# Patient Record
Sex: Male | Born: 1960 | Hispanic: Yes | Smoking: Never smoker
Health system: Southern US, Community
[De-identification: ages and names within clinical notes are randomized; demographics above are authoritative.]

## PROBLEM LIST (undated history)

## (undated) ENCOUNTER — Emergency Department (HOSPITAL_COMMUNITY): Admission: EM | Payer: Self-pay

## (undated) DIAGNOSIS — I10 Essential (primary) hypertension: Secondary | ICD-10-CM

---

## 2020-10-07 ENCOUNTER — Encounter (HOSPITAL_COMMUNITY): Payer: Self-pay

## 2020-10-07 ENCOUNTER — Other Ambulatory Visit: Payer: Self-pay

## 2020-10-07 DIAGNOSIS — U071 COVID-19: Secondary | ICD-10-CM | POA: Insufficient documentation

## 2020-10-07 DIAGNOSIS — K2970 Gastritis, unspecified, without bleeding: Secondary | ICD-10-CM | POA: Insufficient documentation

## 2020-10-07 DIAGNOSIS — I10 Essential (primary) hypertension: Secondary | ICD-10-CM | POA: Insufficient documentation

## 2020-10-07 LAB — CBC
HCT: 48.9 % (ref 39.0–52.0)
Hemoglobin: 16.3 g/dL (ref 13.0–17.0)
MCH: 30.6 pg (ref 26.0–34.0)
MCHC: 33.3 g/dL (ref 30.0–36.0)
MCV: 91.9 fL (ref 80.0–100.0)
Platelets: 221 10*3/uL (ref 150–400)
RBC: 5.32 MIL/uL (ref 4.22–5.81)
RDW: 14.4 % (ref 11.5–15.5)
WBC: 4.5 10*3/uL (ref 4.0–10.5)
nRBC: 0 % (ref 0.0–0.2)

## 2020-10-07 LAB — COMPREHENSIVE METABOLIC PANEL
ALT: 66 U/L — ABNORMAL HIGH (ref 0–44)
AST: 76 U/L — ABNORMAL HIGH (ref 15–41)
Albumin: 4.4 g/dL (ref 3.5–5.0)
Alkaline Phosphatase: 84 U/L (ref 38–126)
Anion gap: 14 (ref 5–15)
BUN: 11 mg/dL (ref 6–20)
CO2: 24 mmol/L (ref 22–32)
Calcium: 9.3 mg/dL (ref 8.9–10.3)
Chloride: 106 mmol/L (ref 98–111)
Creatinine, Ser: 0.68 mg/dL (ref 0.61–1.24)
GFR, Estimated: >60 mL/min (ref >60)
Glucose, Bld: 82 mg/dL (ref 70–99)
Potassium: 3.4 mmol/L — ABNORMAL LOW (ref 3.5–5.1)
Sodium: 144 mmol/L (ref 135–145)
Total Bilirubin: 0.6 mg/dL (ref 0.3–1.2)
Total Protein: 7.7 g/dL (ref 6.5–8.1)

## 2020-10-07 LAB — LIPASE, BLOOD: Lipase: 47 U/L (ref 11–51)

## 2020-10-07 NOTE — ED Notes (Signed)
DARK GREEN ALSO COLLECTED

## 2020-10-07 NOTE — ED Triage Notes (Signed)
Patient arrived with complaints of NVD and abdominal pain since yesterday, declines taking any OTC medication prior to arrival.

## 2020-10-08 ENCOUNTER — Emergency Department (HOSPITAL_COMMUNITY): Payer: Self-pay

## 2020-10-08 ENCOUNTER — Emergency Department (HOSPITAL_COMMUNITY)
Admission: EM | Admit: 2020-10-08 | Discharge: 2020-10-08 | Disposition: A | Discharge status: Home / Self Care | Payer: Self-pay | Attending: Emergency Medicine | Admitting: Emergency Medicine

## 2020-10-08 ENCOUNTER — Encounter (HOSPITAL_COMMUNITY): Payer: Self-pay | Admitting: Radiology

## 2020-10-08 DIAGNOSIS — R197 Diarrhea, unspecified: Secondary | ICD-10-CM

## 2020-10-08 DIAGNOSIS — R110 Nausea: Secondary | ICD-10-CM

## 2020-10-08 DIAGNOSIS — R112 Nausea with vomiting, unspecified: Secondary | ICD-10-CM

## 2020-10-08 DIAGNOSIS — R1013 Epigastric pain: Secondary | ICD-10-CM

## 2020-10-08 DIAGNOSIS — K2970 Gastritis, unspecified, without bleeding: Secondary | ICD-10-CM

## 2020-10-08 HISTORY — DX: Essential (primary) hypertension: I10

## 2020-10-08 LAB — SARS CORONAVIRUS 2 (TAT 6-24 HRS): SARS Coronavirus 2: POSITIVE — AB

## 2020-10-08 MED ORDER — SODIUM CHLORIDE 0.9 % IV BOLUS
1000.0000 mL | Freq: Once | INTRAVENOUS | Status: AC
  Administered 2020-10-08: 1000 mL via INTRAVENOUS

## 2020-10-08 MED ORDER — LORAZEPAM 2 MG/ML IJ SOLN
1.0000 mg | Freq: Once | INTRAMUSCULAR | Status: AC
  Administered 2020-10-08: 1 mg via INTRAVENOUS
  Filled 2020-10-08: qty 1

## 2020-10-08 MED ORDER — PANTOPRAZOLE SODIUM 20 MG PO TBEC: 20.0000 mg | DELAYED_RELEASE_TABLET | Freq: Every day | ORAL | 0 refills | Status: DC

## 2020-10-08 MED ORDER — IOHEXOL 300 MG/ML  SOLN
100.0000 mL | Freq: Once | INTRAMUSCULAR | Status: AC | PRN
  Administered 2020-10-08: 100 mL via INTRAVENOUS

## 2020-10-08 MED ORDER — PANTOPRAZOLE SODIUM 40 MG IV SOLR
40.0000 mg | Freq: Once | INTRAVENOUS | Status: AC
  Administered 2020-10-08: 40 mg via INTRAVENOUS
  Filled 2020-10-08: qty 40

## 2020-10-08 MED ORDER — ONDANSETRON HCL 4 MG/2ML IJ SOLN
4.0000 mg | Freq: Once | INTRAMUSCULAR | Status: AC
  Administered 2020-10-08: 4 mg via INTRAVENOUS
  Filled 2020-10-08: qty 2

## 2020-10-08 MED ORDER — ONDANSETRON HCL 4 MG PO TABS: 4.0000 mg | ORAL_TABLET | Freq: Four times a day (QID) | ORAL | 0 refills | Status: AC

## 2020-10-08 MED ORDER — SUCRALFATE 1 G PO TABS
1.0000 g | ORAL_TABLET | Freq: Three times a day (TID) | ORAL | 0 refills | Status: DC
Start: 2020-10-08 — End: 2020-11-28

## 2020-10-08 NOTE — ED Notes (Signed)
Pt found lying on floor, states it is more comfortable. Vomiting, tremulous. States he is a daily drinker, last drink yesterday morning.

## 2020-10-08 NOTE — ED Provider Notes (Signed)
[facility]-EMERGENCY DEPT Provider Note   CSN: [id number] Arrival date & time: 10/07/20  2111     History Chief Complaint  Patient presents with  . Abdominal Pain    Willie Collins is a 60 y.o. male.  The history is provided by the patient.  Abdominal Pain Pain location:  Epigastric Pain quality: aching   Pain radiates to:  Does not radiate Pain severity:  Mild Onset quality:  Gradual Duration:  2 days Timing:  Constant Progression:  Unchanged Chronicity:  New Context: alcohol use   Relieved by:  Nothing Worsened by:  Nothing Associated symptoms: diarrhea, nausea and vomiting   Associated symptoms: no chest pain, no chills, no cough, no dysuria, no fever, no hematuria, no shortness of breath and no sore throat   Risk factors: alcohol abuse   Risk factors: has not had multiple surgeries        Past Medical History:  Diagnosis Date  . Hypertension     There are no problems to display for this patient.      No family history on file.     Home Medications Prior to Admission medications   Medication Sig Start Date End Date Taking? Authorizing Provider  ondansetron (ZOFRAN) 4 MG tablet Take 1 tablet (4 mg total) by mouth every 6 (six) hours for 15 doses. 10/08/20 10/12/20 Yes Diona Peregoy, DO  pantoprazole (PROTONIX) 20 MG tablet Take 1 tablet (20 mg total) by mouth daily. 10/08/20 11/07/20 Yes Ladeja Pelham, DO  sucralfate (CARAFATE) 1 g tablet Take 1 tablet (1 g total) by mouth 4 (four) times daily -  with meals and at bedtime for 14 days. 10/08/20 10/22/20 Yes Lorey Pallett, DO    Allergies    Patient has no known allergies.  Review of Systems   Review of Systems  Constitutional: Negative for chills and fever.  HENT: Negative for ear pain and sore throat.   Eyes: Negative for pain and visual disturbance.  Respiratory: Negative for cough and shortness of breath.   Cardiovascular: Negative for chest pain and palpitations.   Gastrointestinal: Positive for abdominal pain, diarrhea, nausea and vomiting.  Genitourinary: Negative for dysuria and hematuria.  Musculoskeletal: Negative for arthralgias and back pain.  Skin: Negative for color change and rash.  Neurological: Negative for seizures and syncope.  All other systems reviewed and are negative.   Physical Exam Updated Vital Signs  ED Triage Vitals  Enc Vitals Group     BP 10/07/20 2132 104/86     Pulse Rate 10/07/20 2132 (!) 106     Resp 10/07/20 2132 16     Temp 10/07/20 2132 98.2 F (36.8 C)     Temp Source 10/07/20 2132 Oral     SpO2 10/07/20 2132 93 %     Weight --      Height --      Head Circumference --      Peak Flow --      Pain Score 10/07/20 2129 10     Pain Loc --      Pain Edu? --      Excl. in GC? --     Physical Exam Vitals and nursing note reviewed.  Constitutional:      General: He is in acute distress.     Appearance: He is well-developed and well-nourished.  HENT:     Head: Normocephalic and atraumatic.  Eyes:     Extraocular Movements: Extraocular movements intact.     Conjunctiva/sclera: Conjunctivae normal.  Pupils: Pupils are equal, round, and reactive to light.  Cardiovascular:     Rate and Rhythm: Normal rate and regular rhythm.     Heart sounds: Normal heart sounds. No murmur heard.   Pulmonary:     Effort: Pulmonary effort is normal. No respiratory distress.     Breath sounds: Normal breath sounds.  Abdominal:     General: There is no distension.     Palpations: Abdomen is soft.     Tenderness: There is generalized abdominal tenderness and tenderness in the epigastric area.  Musculoskeletal:        General: No edema.     Cervical back: Neck supple.  Skin:    General: Skin is warm and dry.     Capillary Refill: Capillary refill takes less than 2 seconds.  Neurological:     General: No focal deficit present.     Mental Status: He is alert.  Psychiatric:        Mood and Affect: Mood and affect  and mood normal.     ED Results / Procedures / Treatments   Labs (all labs ordered are listed, but only abnormal results are displayed) Labs Reviewed  COMPREHENSIVE METABOLIC PANEL - Abnormal; Notable for the following components:      Result Value   Potassium 3.4 (*)    AST 76 (*)    ALT 66 (*)    All other components within normal limits  SARS CORONAVIRUS 2 (TAT 6-24 HRS)  LIPASE, BLOOD  CBC    EKG EKG Interpretation  Date/Time:  Tuesday October 08 2020 09:56:22 EST Ventricular Rate:  85 PR Interval:    QRS Duration: 81 QT Interval:  371 QTC Calculation: 442 R Axis:   67 Text Interpretation: Atrial fibrillation Borderline T wave abnormalities Artifact in lead(s) I II aVR V1 Confirmed by Virgina Norfolk [id number] on 10/08/2020 10:15:18 AM   Radiology CT ABDOMEN PELVIS W CONTRAST  Result Date: 10/08/2020 CLINICAL DATA:  61 year old male with abdominal distension. Abdominal pain with nausea vomiting and diarrhea since yesterday. EXAM: CT ABDOMEN AND PELVIS WITH CONTRAST TECHNIQUE: Multidetector CT imaging of the abdomen and pelvis was performed using the standard protocol following bolus administration of intravenous contrast. CONTRAST:  OMNIPAQUE IOHEXOL 300 MG/ML  SOLN COMPARISON:  None. FINDINGS: Lower chest: Mild lung base atelectasis greater on the right. No cardiomegaly, pericardial or pleural effusion. Hepatobiliary: Hepatic steatosis. Negative gallbladder. No bile duct enlargement. Pancreas: Negative. Spleen: Negative. Adrenals/Urinary Tract: Normal adrenal glands. Negative kidneys, with symmetric renal enhancement and contrast excretion. Small benign right renal cortical cyst. Ureters appear symmetric and negative. Negative urinary bladder. Incidental pelvic phleboliths. Stomach/Bowel: Negative large bowel aside from redundancy of the sigmoid colon. Normal appendix (coronal image 66). Negative terminal ileum. No dilated small bowel. Stomach and duodenum are partially  decompressed, within normal limits. No free air, free fluid, mesenteric inflammation. Vascular/Lymphatic: Aortoiliac calcified atherosclerosis. Major arterial structures remain patent. Portal venous system is patent. No lymphadenopathy. Reproductive: Possible small fat containing left inguinal hernia, otherwise negative. Other: No pelvic free fluid. Musculoskeletal: Advanced lumbar spine degeneration. Dystrophic calcifications right flank subcutaneous tissue. No acute osseous abnormality identified. IMPRESSION: 1. No acute or inflammatory process identified in the abdomen or pelvis. 2. Hepatic steatosis. 3. Aortic Atherosclerosis (ICD10-I70.0). Electronically Signed   By: Odessa Fleming M.D.   On: 10/08/2020 10:36    Procedures Procedures   Medications Ordered in ED Medications  sodium chloride 0.9 % bolus 1,000 mL (1,000 mLs Intravenous New Bag/Given 10/08/20 0954)  ondansetron [facility]) injection 4 mg (4 mg Intravenous Given 10/08/20 0955)  LORazepam (ATIVAN) injection 1 mg (1 mg Intravenous Given 10/08/20 0955)  pantoprazole (PROTONIX) injection 40 mg (40 mg Intravenous Given 10/08/20 1024)  iohexol (OMNIPAQUE) 300 MG/ML solution 100 mL (100 mLs Intravenous Contrast Given 10/08/20 1008)    ED Course  I have reviewed the triage vital signs and the nursing notes.  Pertinent labs & imaging results that were available during my care of the patient were reviewed by me and considered in my medical decision making (see chart for details).    MDM Rules/Calculators/A&P                          Willie Collins is a 60 year old male with history of hypertension who presents the ED with nausea, vomiting, diarrhea, epigastric abdominal pain.  Pain for the last day or so.  Daily alcohol drinker.  Last drink was yesterday.  Denies any black or bloody stools.  Possible Covid exposure.  Denies any suspicious food intake.  Tender diffusely on exam but worse in the epigastric region.  Vital signs are normal.  Concern  for gastritis/reflux/pancreatitis/cholecystitis.  Lower suspicion for GI bleed.  We will get basic labs and CT scan abdomen and pelvis.  Will give IV fluids, IV Ativan, IV Zofran and reevaluate.  Lipase is normal doubt pancreatitis.  No significant elevation in hepatobiliary enzymes have a low suspicion for cholecystitis.  No white count.  Normal hemoglobin.  Awaiting CT scan and symptomatic care.  Will give a dose of IV Protonix.  CT scan without any acute findings.  No appendicitis.  No bowel obstruction.  Overall suspect viral process versus alcohol related issue.  Will start Protonix, Carafate and prescribe Zofran.  Will check for Covid.  This chart was dictated using voice recognition software.  Despite best efforts to proofread,  errors can occur which can change the documentation meaning.    Final Clinical Impression(s) / ED Diagnoses Final diagnoses:  Epigastric pain  Nausea  Nausea vomiting and diarrhea  Gastritis without bleeding, unspecified chronicity, unspecified gastritis type    Rx / DC Orders ED Discharge Orders         Ordered    ondansetron (ZOFRAN) 4 MG tablet  Every 6 hours        10/08/20 1108    pantoprazole (PROTONIX) 20 MG tablet  Daily        10/08/20 1108    sucralfate (CARAFATE) 1 g tablet  3 times daily with meals & bedtime        10/08/20 1108           Ellarose Brandi, DO 10/08/20 1108

## 2020-11-24 ENCOUNTER — Other Ambulatory Visit: Payer: Self-pay

## 2020-11-24 ENCOUNTER — Inpatient Hospital Stay (HOSPITAL_COMMUNITY)
Admission: EM | Admit: 2020-11-24 | Discharge: 2020-11-28 | DRG: 603 | Disposition: A | Discharge status: Home Health | Payer: Self-pay | Attending: Family Medicine | Admitting: Family Medicine

## 2020-11-24 ENCOUNTER — Encounter (HOSPITAL_COMMUNITY): Payer: Self-pay

## 2020-11-24 ENCOUNTER — Emergency Department (HOSPITAL_COMMUNITY): Payer: Self-pay

## 2020-11-24 DIAGNOSIS — F419 Anxiety disorder, unspecified: Secondary | ICD-10-CM

## 2020-11-24 DIAGNOSIS — E872 Acidosis: Secondary | ICD-10-CM | POA: Diagnosis present

## 2020-11-24 DIAGNOSIS — I10 Essential (primary) hypertension: Secondary | ICD-10-CM | POA: Diagnosis present

## 2020-11-24 DIAGNOSIS — R17 Unspecified jaundice: Secondary | ICD-10-CM | POA: Diagnosis present

## 2020-11-24 DIAGNOSIS — E875 Hyperkalemia: Secondary | ICD-10-CM

## 2020-11-24 DIAGNOSIS — Z20822 Contact with and (suspected) exposure to covid-19: Secondary | ICD-10-CM | POA: Diagnosis present

## 2020-11-24 DIAGNOSIS — F32A Depression, unspecified: Secondary | ICD-10-CM | POA: Diagnosis present

## 2020-11-24 DIAGNOSIS — R739 Hyperglycemia, unspecified: Secondary | ICD-10-CM | POA: Diagnosis present

## 2020-11-24 DIAGNOSIS — L02612 Cutaneous abscess of left foot: Principal | ICD-10-CM

## 2020-11-24 DIAGNOSIS — L0291 Cutaneous abscess, unspecified: Secondary | ICD-10-CM

## 2020-11-24 DIAGNOSIS — M109 Gout, unspecified: Secondary | ICD-10-CM | POA: Diagnosis present

## 2020-11-24 LAB — CBC WITH DIFFERENTIAL/PLATELET
Abs Immature Granulocytes: 0.03 10*3/uL (ref 0.00–0.07)
Basophils Absolute: 0 10*3/uL (ref 0.0–0.1)
Basophils Relative: 0 %
Eosinophils Absolute: 0.1 10*3/uL (ref 0.0–0.5)
Eosinophils Relative: 1 %
HCT: 51.2 % (ref 39.0–52.0)
Hemoglobin: 17.5 g/dL — ABNORMAL HIGH (ref 13.0–17.0)
Immature Granulocytes: 0 %
Lymphocytes Relative: 13 %
Lymphs Abs: 1.2 10*3/uL (ref 0.7–4.0)
MCH: 31.6 pg (ref 26.0–34.0)
MCHC: 34.2 g/dL (ref 30.0–36.0)
MCV: 92.4 fL (ref 80.0–100.0)
Monocytes Absolute: 0.5 10*3/uL (ref 0.1–1.0)
Monocytes Relative: 5 %
Neutro Abs: 7.4 10*3/uL (ref 1.7–7.7)
Neutrophils Relative %: 81 %
Platelets: 267 10*3/uL (ref 150–400)
RBC: 5.54 MIL/uL (ref 4.22–5.81)
RDW: 13 % (ref 11.5–15.5)
WBC: 9.2 10*3/uL (ref 4.0–10.5)
nRBC: 0 % (ref 0.0–0.2)

## 2020-11-24 LAB — COMPREHENSIVE METABOLIC PANEL
ALT: 31 U/L (ref 0–44)
AST: 55 U/L — ABNORMAL HIGH (ref 15–41)
Albumin: 4.3 g/dL (ref 3.5–5.0)
Alkaline Phosphatase: 110 U/L (ref 38–126)
Anion gap: 12 (ref 5–15)
BUN: 14 mg/dL (ref 6–20)
CO2: 22 mmol/L (ref 22–32)
Calcium: 9.9 mg/dL (ref 8.9–10.3)
Chloride: 102 mmol/L (ref 98–111)
Creatinine, Ser: 0.88 mg/dL (ref 0.61–1.24)
GFR, Estimated: >60 mL/min (ref >60)
Glucose, Bld: 115 mg/dL — ABNORMAL HIGH (ref 70–99)
Potassium: 5.6 mmol/L — ABNORMAL HIGH (ref 3.5–5.1)
Sodium: 136 mmol/L (ref 135–145)
Total Bilirubin: 1.6 mg/dL — ABNORMAL HIGH (ref 0.3–1.2)
Total Protein: 8.2 g/dL — ABNORMAL HIGH (ref 6.5–8.1)

## 2020-11-24 LAB — RESP PANEL BY RT-PCR (FLU A&B, COVID) ARPGX2
Influenza A by PCR: NEGATIVE
Influenza B by PCR: NEGATIVE
SARS Coronavirus 2 by RT PCR: NEGATIVE

## 2020-11-24 LAB — LACTIC ACID, PLASMA: Lactic Acid, Venous: 1.6 mmol/L (ref 0.5–1.9)

## 2020-11-24 MED ORDER — MORPHINE SULFATE (PF) 4 MG/ML IV SOLN
4.0000 mg | Freq: Once | INTRAVENOUS | Status: AC
  Administered 2020-11-24: 4 mg via INTRAVENOUS
  Filled 2020-11-24: qty 1

## 2020-11-24 MED ORDER — HYDROMORPHONE HCL 1 MG/ML IJ SOLN
1.0000 mg | Freq: Once | INTRAMUSCULAR | Status: AC
Start: 2020-11-24 — End: 2020-11-24
  Administered 2020-11-24: 1 mg via INTRAVENOUS
  Filled 2020-11-24: qty 1

## 2020-11-24 MED ORDER — ACETAMINOPHEN 325 MG PO TABS
650.0000 mg | ORAL_TABLET | Freq: Four times a day (QID) | ORAL | Status: DC | PRN
  Administered 2020-11-26 – 2020-11-28 (×4): 650 mg via ORAL
  Filled 2020-11-24 (×4): qty 2

## 2020-11-24 MED ORDER — HYDRALAZINE HCL 20 MG/ML IJ SOLN
5.0000 mg | Freq: Three times a day (TID) | INTRAMUSCULAR | Status: DC | PRN
Start: 2020-11-24 — End: 2020-11-29
  Administered 2020-11-26: 5 mg via INTRAVENOUS
  Filled 2020-11-24: qty 1

## 2020-11-24 MED ORDER — ACETAMINOPHEN 650 MG RE SUPP: 650.0000 mg | Freq: Four times a day (QID) | RECTAL | Status: DC | PRN

## 2020-11-24 MED ORDER — VANCOMYCIN HCL IN DEXTROSE 1-5 GM/200ML-% IV SOLN: 1000.0000 mg | Freq: Once | INTRAVENOUS | Status: DC

## 2020-11-24 MED ORDER — HYDROMORPHONE HCL 1 MG/ML IJ SOLN
0.5000 mg | INTRAMUSCULAR | Status: DC | PRN
Start: 2020-11-24 — End: 2020-11-28
  Administered 2020-11-24 – 2020-11-26 (×10): 0.5 mg via INTRAVENOUS
  Filled 2020-11-24 (×10): qty 0.5

## 2020-11-24 MED ORDER — VANCOMYCIN HCL 750 MG/150ML IV SOLN
750.0000 mg | Freq: Two times a day (BID) | INTRAVENOUS | Status: DC
  Administered 2020-11-25 – 2020-11-27 (×7): 750 mg via INTRAVENOUS
  Filled 2020-11-24 (×9): qty 150

## 2020-11-24 MED ORDER — SODIUM CHLORIDE 0.9 % IV BOLUS
500.0000 mL | Freq: Once | INTRAVENOUS | Status: AC
  Administered 2020-11-24: 500 mL via INTRAVENOUS

## 2020-11-24 MED ORDER — LORAZEPAM 0.5 MG PO TABS
0.5000 mg | ORAL_TABLET | Freq: Two times a day (BID) | ORAL | Status: DC | PRN
  Administered 2020-11-25 – 2020-11-28 (×5): 0.5 mg via ORAL
  Filled 2020-11-24 (×5): qty 1

## 2020-11-24 MED ORDER — HEPARIN SODIUM (PORCINE) 5000 UNIT/ML IJ SOLN
5000.0000 [IU] | Freq: Three times a day (TID) | INTRAMUSCULAR | Status: DC
  Administered 2020-11-24 – 2020-11-27 (×6): 5000 [IU] via SUBCUTANEOUS
  Filled 2020-11-24 (×6): qty 1

## 2020-11-24 MED ORDER — VANCOMYCIN HCL IN DEXTROSE 1-5 GM/200ML-% IV SOLN
1000.0000 mg | Freq: Once | INTRAVENOUS | Status: AC
  Administered 2020-11-24: 1000 mg via INTRAVENOUS
  Filled 2020-11-24: qty 200

## 2020-11-24 MED ORDER — KETOROLAC TROMETHAMINE 15 MG/ML IJ SOLN
7.5000 mg | Freq: Four times a day (QID) | INTRAMUSCULAR | Status: DC | PRN
  Administered 2020-11-24 – 2020-11-27 (×7): 7.5 mg via INTRAVENOUS
  Filled 2020-11-24 (×9): qty 1

## 2020-11-24 NOTE — Plan of Care (Signed)

## 2020-11-24 NOTE — Progress Notes (Signed)
Pharmacy Antibiotic Note  Willie Collins is a 60 y.o. male admitted on 11/24/2020 with L foot cellulitis. He is s/p drainage and a course of PO bactrim  Pharmacy has been consulted for vancomycin dosing.  Plan: Vancomcyin 1g IV given in ED, continue with 750mg  IV q12h for estimated AUC 429 using SCr 0.88 Consider checking vancomycin levels at steady state if continues Follow up renal function & cultures    Temp (24hrs), Avg:98.4 F (36.9 C), Min:98.4 F (36.9 C), Max:98.4 F (36.9 C)  Recent Labs  Lab 11/24/20 1336  WBC 9.2  CREATININE 0.88    CrCl cannot be calculated (Unknown ideal weight.).    No Known Allergies  Antimicrobials this admission: 3/13 Vancomycin >>  Dose adjustments this admission:  Microbiology results: 3/13 COVID/Flu: neg/neg  Thank you for allowing pharmacy to be a part of this patient's care.  4/13, PharmD, BCPS Pharmacy: [phone] 11/24/2020 4:23 PM

## 2020-11-24 NOTE — ED Triage Notes (Signed)
Pt presents with c/o left foot pain. Pt has a wound on his left foot, present for approx 2 weeks, had the wound opened and drained at another hospital earlier this week but reports it is not getting any better.

## 2020-11-24 NOTE — H&P (Signed)
History and Physical        Hospital Admission Note Date: 11/24/2020  Patient name: Willie Collins Medical record number: [id number] Date of birth: [birth year] Age: 60 y.o. Gender: male  PCP: Patient, No Pcp Per   Chief Complaint    Chief Complaint  Patient presents with  . Foot Pain      HPI:   This is a 60 year old male with past medical history of anxiety, depression, gout that presents to the ED with left foot pain.  Patient was recently [facility] for left foot pain and swelling with onset 2 weeks ago at which time he had negative plain films but was noted to have a significant abscess on the medial aspect of his left foot.  He had an I&D and was discharged with 5 days of Bactrim.  He has since completed the Bactrim however because of his severe pain he was taking 3 pills daily as opposed to the prescribed 2 pills and has been taking ibuprofen for pain which has not been helping.  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  He has severe anxiety over having an I&D again due to pain from last time and he is shaking from his anxiety (not chills).  Denies any trauma/injury to the area in the past.  Denies fever, chills or any other complaints.  Of note, he recently moved to the area a few months ago [place] and does not have a PCP.   ED Course: Afebrile, hypertensive, on room air. Notable Labs: Sodium 136, K5.6, glucose 115, BUN 14, creatinine 0.88, AST 55, ALT 31, T bili 1.6, WBC 9.2, Hb 17.5, COVID-19 flu negative. Notable Imaging: Left foot and ankle XR-osteoarthritic change first MTP joint, extensive arterial vascular calcification and calcification of first IP joint, no fracture or dislocation osteomyelitis mention. Patient received Dilaudid,  morphine, vancomycin.    Vitals:   11/24/20 1500 11/24/20 1600  BP: (!) 172/101 (!) 168/97  Pulse: 71 60  Resp: 20 18  Temp:  97.8 F (36.6 C)  SpO2: 99% 100%     Review of Systems:  Review of Systems  All other systems reviewed and are negative.   Medical/Social/Family History   Past Medical History: Past Medical History:  Diagnosis Date  . Hypertension     History reviewed. No pertinent surgical history.  Medications: Prior to Admission medications   Medication Sig Start Date End Date Taking? Authorizing Provider  indomethacin (INDOCIN) 50 MG capsule Take 50 mg by mouth daily as needed (gout).   Yes [provider]  pantoprazole (PROTONIX) 20 MG tablet Take 1 tablet (20 mg total) by mouth daily. Patient not taking: Reported on 11/24/2020 10/08/20 11/07/20  Virgina Norfolk, DO  sucralfate (CARAFATE) 1 g tablet Take 1 tablet (1 g total) by mouth 4 (four) times daily -  with meals and at bedtime for 14 days. Patient not taking: Reported on 11/24/2020 10/08/20 10/22/20  Virgina Norfolk, DO  sulfamethoxazole-trimethoprim (BACTRIM DS) 800-160 MG tablet Take 1 tablet by mouth 2 (two) times daily. Start date : 11/19/20 11/19/20   [provider]    Allergies:  No Known Allergies  Social History:  reports that he has never smoked. He has never used smokeless tobacco. He reports current alcohol use. He reports that he does not use drugs.  Family History: History reviewed. No pertinent family history.   Objective   Physical Exam: Blood pressure (!) 168/97, pulse 60, temperature 97.8 F (36.6 C), temperature source Oral, resp. rate 18, SpO2 100 %.  Physical Exam Vitals and nursing note reviewed.  Constitutional:      Appearance: Normal appearance.     Comments: Generalized shaking  HENT:     Head: Normocephalic and atraumatic.  Eyes:     Conjunctiva/sclera: Conjunctivae normal.  Cardiovascular:     Rate and Rhythm: Normal rate and regular rhythm.   Pulmonary:     Effort: Pulmonary effort is normal.     Breath sounds: Normal breath sounds.  Abdominal:     General: Abdomen is flat.     Palpations: Abdomen is soft.  Musculoskeletal:     Comments: Left medial foot with large abscess and surrounding cellulitis (see pictures below obtained by ED provider)  Skin:    Coloration: Skin is not jaundiced or pale.  Neurological:     Mental Status: He is alert. Mental status is at baseline.  Psychiatric:        Mood and Affect: Mood is anxious.       LABS on Admission: I have personally reviewed all the labs and imaging below    Basic Metabolic Panel: Recent Labs  Lab 11/24/20 1336  NA 136  K 5.6*  CL 102  CO2 22  GLUCOSE 115*  BUN 14  CREATININE 0.88  CALCIUM 9.9   Liver Function Tests: Recent Labs  Lab 11/24/20 1336  AST 55*  ALT 31  ALKPHOS 110  BILITOT 1.6*  PROT 8.2*  ALBUMIN 4.3   No results for input(s): LIPASE, AMYLASE in the last 168 hours. No results for input(s): AMMONIA in the last 168 hours. CBC: Recent Labs  Lab 11/24/20 1336  WBC 9.2  NEUTROABS 7.4  HGB 17.5*  HCT 51.2  MCV 92.4  PLT 267   Cardiac Enzymes: No results for input(s): CKTOTAL, CKMB, CKMBINDEX, TROPONINI in the last 168 hours. BNP: Invalid input(s): POCBNP CBG: No results for input(s): GLUCAP in the last 168 hours.  Radiological Exams on Admission:  DG Ankle Complete Left  Result Date: 11/24/2020 CLINICAL DATA:  Pain with reported recent drainage of lesion from the medial left ankle region EXAM: LEFT ANKLE COMPLETE - 3+ VIEW COMPARISON:  None. FINDINGS: Frontal, oblique, and lateral views were obtained. No evident fracture or joint effusion. Joint spaces appear normal. No erosive change or bony destruction. There is no soft tissue air. There are multiple foci of arterial vascular calcification. IMPRESSION: No fracture or erosion. No bony destruction. No arthropathy appreciable. Ankle mortise appears intact. Multiple foci of  arterial vascular calcification noted. Question underlying diabetes mellitus. Electronically Signed   By: Bretta Bang III M.D.   On: 11/24/2020 14:06   DG Foot Complete Left  Result Date: 11/24/2020 CLINICAL DATA:  Pain EXAM: LEFT FOOT - COMPLETE 3+ VIEW COMPARISON:  None. FINDINGS: Frontal, oblique, and lateral views were obtained. There is no fracture or dislocation. There is moderate narrowing in the first MTP joint with spurring in this area. Calcification is noted in the first IP joint without appreciable joint space narrowing. No erosive changes or bony destruction. There are multiple foci of arterial vascular calcification.  No evidence soft tissue air. IMPRESSION: Osteoarthritic change first MTP joint. Calcification in the first IP joint is likely of arthropathic etiology. No fracture or dislocation. No bony destruction or erosion. Extensive arterial vascular calcification noted. Electronically Signed   By: Bretta Bang III M.D.   On: 11/24/2020 14:04      EKG: Not done   A & P   Principal Problem:   Foot abscess, left Active Problems:   Anxiety   Hyperkalemia   1. Left foot abscess, failed outpatient antibiotics a. Afebrile without leukocytosis, not septic b. Failed outpatient Bactrim c. Left foot XR with extensive vascular calcifications which may be hindering antibiotic therapy and may need vascular eval at some point outpatient d. Discussed with Dr. Samuella Cota, podiatry, plan is for I&D tomorrow and probably discharge post procedure e. N.p.o. after midnight f. Continue vancomycin  2. Anxiety a. Severe anxiety over upcoming procedure leading to generalized shaking b. Ativan 0.5 mg twice daily as needed c. Provided reassurance  3. Hypertension likely related to pain, anxiety possibly hypertensive a. Pain and anxiety control b. As needed hydralazine c. TOC consult, patient needs PCP  4. Asymptomatic hyperkalemia a. K5.6 b. Check EKG  c. IV fluids   DVT  prophylaxis: Heparin   Code Status: Full Code  Diet: Heart healthy, n.p.o. after midnight Family Communication: Admission, patients condition and plan of care including tests being ordered have been discussed with the patient who indicates understanding and agrees with the plan and Code Status.  Disposition Plan: The appropriate patient status for this patient is OBSERVATION. Observation status is judged to be reasonable and necessary in order to provide the required intensity of service to ensure the patient's safety. The patient's presenting symptoms, physical exam findings, and initial radiographic and laboratory data in the context of their medical condition is felt to place them at decreased risk for further clinical deterioration. Furthermore, it is anticipated that the patient will be medically stable for discharge from the hospital within 2 midnights of admission. The following factors support the patient status of observation.   " The patient's presenting symptoms include left foot pain. " The physical exam findings include left foot abscess and cellulitis. " The initial radiographic and laboratory data are concerning for arthritis in his left foot.    Status is: Observation  The patient remains OBS appropriate and will d/c before 2 midnights.  Dispo: The patient is from: Home              Anticipated d/c is to: Home              Patient currently is not medically stable to d/c.   Difficult to place patient No     Consultants  . Podiatry  Procedures  . None  Time Spent on Admission: 55 minutes    Jae Dire, DO [facility]  11/24/2020, 4:57 PM

## 2020-11-24 NOTE — ED Provider Notes (Signed)
[facility]-EMERGENCY DEPT Provider Note   CSN: [id number] Arrival date & time: 11/24/20  1237     History Chief Complaint  Patient presents with   Foot Pain    Willie Collins is a 60 y.o. male with past medical history of depression that presents to the emergency department today for continued left foot pain.  Patient is Spanish-speaking, however did not need interpreter.  Per chart review, patient went to [facility] for left foot pain with swelling with onset 2 weeks ago, at that time they had negative plain films, there was a significant abscess to the medial aspect of his left foot, was drained and placed on Bactrim.  Patient states that after he left on the seventh, his foot started to hurt worsehas been taking ibuprofen.  Patient states that he finished his full course of Bactrim.  States that the area has been getting bigger.  Denies any fevers nausea vomiting.  Patient states that it hurt so bad he cannot walk on the area, had to be fired from his job because he cannot go back in due to the pain. Patient states that now the pain has worsened.  Patient states that he is an extremely anxious person and this is caused him to have more anxiety.  Patient is extremely anxious about having this I&D again, states that he does not want to use the lidocaine again because he was unable to tolerate that.  Denies any numbness and tingling into the area.  States that pain is radiating up into his leg now.  Denies any streaking.  No other complaints.     HPI     Past Medical History:  Diagnosis Date   Hypertension     There are no problems to display for this patient.   History reviewed. No pertinent surgical history.     History reviewed. No pertinent family history.     Home Medications Prior to Admission medications   Medication Sig Start Date End Date Taking? Authorizing Provider  pantoprazole (PROTONIX) 20 MG tablet Take 1 tablet (20  mg total) by mouth daily. 10/08/20 11/07/20  Curatolo, Adam, DO  sucralfate (CARAFATE) 1 g tablet Take 1 tablet (1 g total) by mouth 4 (four) times daily -  with meals and at bedtime for 14 days. 10/08/20 10/22/20  Virgina Norfolk, DO    Allergies    Patient has no known allergies.  Review of Systems   Review of Systems  Constitutional: Negative for chills, diaphoresis, fatigue and fever.  HENT: Negative for congestion, sore throat and trouble swallowing.   Eyes: Negative for pain and visual disturbance.  Respiratory: Negative for cough, shortness of breath and wheezing.   Cardiovascular: Negative for chest pain, palpitations and leg swelling.  Gastrointestinal: Negative for abdominal distention, abdominal pain, diarrhea, nausea and vomiting.  Genitourinary: Negative for difficulty urinating.  Musculoskeletal: Positive for arthralgias. Negative for back pain, neck pain and neck stiffness.  Skin: Positive for color change. Negative for pallor.  Neurological: Negative for dizziness, speech difficulty, weakness and headaches.  Psychiatric/Behavioral: Negative for confusion.    Physical Exam Updated Vital Signs BP (!) 172/101    Pulse 71    Temp 98.4 F (36.9 C) (Oral)    Resp 20    SpO2 99%   Physical Exam Constitutional:      General: He is not in acute distress.    Appearance: Normal appearance. He is not ill-appearing, toxic-appearing or diaphoretic.  Cardiovascular:  Rate and Rhythm: Normal rate and regular rhythm.     Pulses: Normal pulses.  Pulmonary:     Effort: Pulmonary effort is normal.     Breath sounds: Normal breath sounds.  Musculoskeletal:        General: Normal range of motion.     Comments: Patient with tenderness and erythema to medial aspect of ankle, seen in picture.  Erythema surrounding this.  Area is extremely tender to touch with fluctuance throughout.  No streaking noted.  Not warm to touch.  Area is about 4 cm in diameter.  See picture below.  PT pulses 2+  with normal range of motion to ankle, able to range ankle fully, does have pain to that area.  States that he is unable to ambulate due to pain in his foot.   Skin:    General: Skin is warm and dry.     Capillary Refill: Capillary refill takes less than 2 seconds.     Findings: Erythema present.  Neurological:     General: No focal deficit present.     Mental Status: He is alert and oriented to person, place, and time.  Psychiatric:        Mood and Affect: Mood normal.        Behavior: Behavior normal.        Thought Content: Thought content normal.       ED Results / Procedures / Treatments   Labs (all labs ordered are listed, but only abnormal results are displayed) Labs Reviewed  COMPREHENSIVE METABOLIC PANEL - Abnormal; Notable for the following components:      Result Value   Potassium 5.6 (*)    Glucose, Bld 115 (*)    Total Protein 8.2 (*)    AST 55 (*)    Total Bilirubin 1.6 (*)    All other components within normal limits  CBC WITH DIFFERENTIAL/PLATELET - Abnormal; Notable for the following components:   Hemoglobin 17.5 (*)    All other components within normal limits  RESP PANEL BY RT-PCR (FLU A&B, COVID) [id number]    EKG None  Radiology DG Ankle Complete Left  Result Date: 11/24/2020 CLINICAL DATA:  Pain with reported recent drainage of lesion from the medial left ankle region EXAM: LEFT ANKLE COMPLETE - 3+ VIEW COMPARISON:  None. FINDINGS: Frontal, oblique, and lateral views were obtained. No evident fracture or joint effusion. Joint spaces appear normal. No erosive change or bony destruction. There is no soft tissue air. There are multiple foci of arterial vascular calcification. IMPRESSION: No fracture or erosion. No bony destruction. No arthropathy appreciable. Ankle mortise appears intact. Multiple foci of arterial vascular calcification noted. Question underlying diabetes mellitus. Electronically Signed   By: Bretta Bang III M.D.   On: 11/24/2020 14:06    DG Foot Complete Left  Result Date: 11/24/2020 CLINICAL DATA:  Pain EXAM: LEFT FOOT - COMPLETE 3+ VIEW COMPARISON:  None. FINDINGS: Frontal, oblique, and lateral views were obtained. There is no fracture or dislocation. There is moderate narrowing in the first MTP joint with spurring in this area. Calcification is noted in the first IP joint without appreciable joint space narrowing. No erosive changes or bony destruction. There are multiple foci of arterial vascular calcification. No evidence soft tissue air. IMPRESSION: Osteoarthritic change first MTP joint. Calcification in the first IP joint is likely of arthropathic etiology. No fracture or dislocation. No bony destruction or erosion. Extensive arterial vascular calcification noted. Electronically Signed   By: Bretta Bang III  M.D.   On: 11/24/2020 14:04    Procedures Procedures   Medications Ordered in ED Medications  vancomycin (VANCOCIN) IVPB 1000 mg/200 mL premix (1,000 mg Intravenous New Bag/Given 11/24/20 1521)  morphine 4 MG/ML injection 4 mg (4 mg Intravenous Given 11/24/20 1407)  HYDROmorphone (DILAUDID) injection 1 mg (1 mg Intravenous Given 11/24/20 1521)    ED Course  I have reviewed the triage vital signs and the nursing notes.  Pertinent labs & imaging results that were available during my care of the patient were reviewed by me and considered in my medical decision making (see chart for details).    MDM Rules/Calculators/A&P                          Willie Collins is a 60 y.o. male with past medical history of depression that presents to the emergency department today for continued left foot pain.  Patient appears well, does not appear septic.  Is a severe amount of pain, will give morphine.  Plain films negative.  Did place bedside ultrasound, there is area of fluid collection in this area, patient is asking to be put asleep for this procedure since he had traumatic I&D during last time, was unable to tolerate  lidocaine.  Will obtain basic labs and then consult podiatry for further recommendation.  Patient is distally neurovascularly intact, no concerns for septic joint.  Spoke to podiatry, Dr. Samuella Cota who wants patient admitted for IV antibiotics and he will consult.  IV vancomycin started.  Patient admitted to hospitalist Dr. Dairl Ponder.   The patient appears reasonably stabilized for admission considering the current resources, flow, and capabilities available in the ED at this time, and I doubt any other [facility] requiring further screening and/or treatment in the ED prior to admission.    Final Clinical Impression(s) / ED Diagnoses Final diagnoses:  Abscess    Rx / DC Orders ED Discharge Orders    None       Farrel Gordon, PA-C 11/24/20 1531    Long, Arlyss Repress, MD 11/26/20 1235

## 2020-11-24 NOTE — ED Notes (Signed)
Secure message sent to Willie Collins

## 2020-11-25 ENCOUNTER — Encounter (HOSPITAL_COMMUNITY): Payer: Self-pay | Admitting: Internal Medicine

## 2020-11-25 ENCOUNTER — Inpatient Hospital Stay (HOSPITAL_COMMUNITY): Payer: Self-pay | Admitting: Anesthesiology

## 2020-11-25 ENCOUNTER — Encounter (HOSPITAL_COMMUNITY)
Admission: EM | Disposition: A | Discharge status: Home Health | Payer: Self-pay | Source: Home / Self Care | Attending: Internal Medicine

## 2020-11-25 ENCOUNTER — Other Ambulatory Visit: Payer: Self-pay | Admitting: Podiatry

## 2020-11-25 DIAGNOSIS — L02612 Cutaneous abscess of left foot: Secondary | ICD-10-CM

## 2020-11-25 DIAGNOSIS — R7401 Elevation of levels of liver transaminase levels: Secondary | ICD-10-CM

## 2020-11-25 DIAGNOSIS — R739 Hyperglycemia, unspecified: Secondary | ICD-10-CM

## 2020-11-25 HISTORY — PX: IRRIGATION AND DEBRIDEMENT FOOT: SHX6602

## 2020-11-25 LAB — CBC WITH DIFFERENTIAL/PLATELET
Abs Immature Granulocytes: 0.03 10*3/uL (ref 0.00–0.07)
Basophils Absolute: 0 10*3/uL (ref 0.0–0.1)
Basophils Relative: 0 %
Eosinophils Absolute: 0.1 10*3/uL (ref 0.0–0.5)
Eosinophils Relative: 1 %
HCT: 42.5 % (ref 39.0–52.0)
Hemoglobin: 14.2 g/dL (ref 13.0–17.0)
Immature Granulocytes: 1 %
Lymphocytes Relative: 21 %
Lymphs Abs: 1.3 10*3/uL (ref 0.7–4.0)
MCH: 31.1 pg (ref 26.0–34.0)
MCHC: 33.4 g/dL (ref 30.0–36.0)
MCV: 93 fL (ref 80.0–100.0)
Monocytes Absolute: 0.4 10*3/uL (ref 0.1–1.0)
Monocytes Relative: 7 %
Neutro Abs: 4.4 10*3/uL (ref 1.7–7.7)
Neutrophils Relative %: 70 %
Platelets: 205 10*3/uL (ref 150–400)
RBC: 4.57 MIL/uL (ref 4.22–5.81)
RDW: 13.1 % (ref 11.5–15.5)
WBC: 6.2 10*3/uL (ref 4.0–10.5)
nRBC: 0 % (ref 0.0–0.2)

## 2020-11-25 LAB — COMPREHENSIVE METABOLIC PANEL
ALT: 20 U/L (ref 0–44)
AST: 22 U/L (ref 15–41)
Albumin: 3.6 g/dL (ref 3.5–5.0)
Alkaline Phosphatase: 85 U/L (ref 38–126)
Anion gap: 14 (ref 5–15)
BUN: 11 mg/dL (ref 6–20)
CO2: 21 mmol/L — ABNORMAL LOW (ref 22–32)
Calcium: 8.9 mg/dL (ref 8.9–10.3)
Chloride: 100 mmol/L (ref 98–111)
Creatinine, Ser: 0.72 mg/dL (ref 0.61–1.24)
GFR, Estimated: >60 mL/min (ref >60)
Glucose, Bld: 98 mg/dL (ref 70–99)
Potassium: 3.6 mmol/L (ref 3.5–5.1)
Sodium: 135 mmol/L (ref 135–145)
Total Bilirubin: 1.3 mg/dL — ABNORMAL HIGH (ref 0.3–1.2)
Total Protein: 6.7 g/dL (ref 6.5–8.1)

## 2020-11-25 LAB — PHOSPHORUS: Phosphorus: 2.7 mg/dL (ref 2.5–4.6)

## 2020-11-25 LAB — MAGNESIUM: Magnesium: 1.6 mg/dL — ABNORMAL LOW (ref 1.7–2.4)

## 2020-11-25 LAB — HIV ANTIBODY (ROUTINE TESTING W REFLEX): HIV Screen 4th Generation wRfx: NONREACTIVE

## 2020-11-25 SURGERY — IRRIGATION AND DEBRIDEMENT FOOT
Anesthesia: General | Laterality: Left

## 2020-11-25 MED ORDER — FENTANYL CITRATE (PF) 100 MCG/2ML IJ SOLN
INTRAMUSCULAR | Status: AC
  Filled 2020-11-25: qty 4

## 2020-11-25 MED ORDER — FENTANYL CITRATE (PF) 100 MCG/2ML IJ SOLN
25.0000 ug | INTRAMUSCULAR | Status: DC | PRN
  Administered 2020-11-25 (×3): 50 ug via INTRAVENOUS

## 2020-11-25 MED ORDER — FENTANYL CITRATE (PF) 100 MCG/2ML IJ SOLN
INTRAMUSCULAR | Status: AC
  Filled 2020-11-25: qty 2

## 2020-11-25 MED ORDER — ONDANSETRON HCL 4 MG/2ML IJ SOLN
INTRAMUSCULAR | Status: AC
  Filled 2020-11-25: qty 2

## 2020-11-25 MED ORDER — BUPIVACAINE HCL (PF) 0.25 % IJ SOLN
INTRAMUSCULAR | Status: DC | PRN
  Administered 2020-11-25: 10 mL

## 2020-11-25 MED ORDER — PROPOFOL 10 MG/ML IV BOLUS
INTRAVENOUS | Status: AC
  Filled 2020-11-25: qty 20

## 2020-11-25 MED ORDER — ONDANSETRON HCL 4 MG/2ML IJ SOLN
INTRAMUSCULAR | Status: DC | PRN
  Administered 2020-11-25: 4 mg via INTRAVENOUS

## 2020-11-25 MED ORDER — SODIUM CHLORIDE 0.9 % IV SOLN: INTRAVENOUS | Status: DC

## 2020-11-25 MED ORDER — CHLORHEXIDINE GLUCONATE 0.12 % MT SOLN
15.0000 mL | Freq: Once | OROMUCOSAL | Status: AC
  Administered 2020-11-25: 15 mL via OROMUCOSAL

## 2020-11-25 MED ORDER — MIDAZOLAM HCL 2 MG/2ML IJ SOLN
INTRAMUSCULAR | Status: AC
  Filled 2020-11-25: qty 2

## 2020-11-25 MED ORDER — LIDOCAINE 2% (20 MG/ML) 5 ML SYRINGE
INTRAMUSCULAR | Status: AC
  Filled 2020-11-25: qty 5

## 2020-11-25 MED ORDER — OXYCODONE-ACETAMINOPHEN 5-325 MG PO TABS: 1.0000 | ORAL_TABLET | ORAL | 0 refills | Status: DC | PRN

## 2020-11-25 MED ORDER — LACTATED RINGERS IV SOLN: INTRAVENOUS | Status: DC

## 2020-11-25 MED ORDER — LIDOCAINE 2% (20 MG/ML) 5 ML SYRINGE
INTRAMUSCULAR | Status: DC | PRN
  Administered 2020-11-25: 100 mg via INTRAVENOUS

## 2020-11-25 MED ORDER — FENTANYL CITRATE (PF) 100 MCG/2ML IJ SOLN
INTRAMUSCULAR | Status: DC | PRN
  Administered 2020-11-25: 50 ug via INTRAVENOUS
  Administered 2020-11-25 (×2): 25 ug via INTRAVENOUS

## 2020-11-25 MED ORDER — AMISULPRIDE (ANTIEMETIC) 5 MG/2ML IV SOLN
INTRAVENOUS | Status: AC
  Filled 2020-11-25: qty 4

## 2020-11-25 MED ORDER — MIDAZOLAM HCL 2 MG/2ML IJ SOLN
INTRAMUSCULAR | Status: DC | PRN
  Administered 2020-11-25: 2 mg via INTRAVENOUS

## 2020-11-25 MED ORDER — PROPOFOL 10 MG/ML IV BOLUS
INTRAVENOUS | Status: DC | PRN
  Administered 2020-11-25: 200 mg via INTRAVENOUS

## 2020-11-25 MED ORDER — MAGNESIUM SULFATE 2 GM/50ML IV SOLN: 2.0000 g | Freq: Once | INTRAVENOUS | Status: DC

## 2020-11-25 MED ORDER — AMISULPRIDE (ANTIEMETIC) 5 MG/2ML IV SOLN
10.0000 mg | Freq: Once | INTRAVENOUS | Status: AC | PRN
  Administered 2020-11-25: 10 mg via INTRAVENOUS

## 2020-11-25 MED ORDER — PHENYLEPHRINE 40 MCG/ML (10ML) SYRINGE FOR IV PUSH (FOR BLOOD PRESSURE SUPPORT)
PREFILLED_SYRINGE | INTRAVENOUS | Status: DC | PRN
  Administered 2020-11-25 (×3): 120 ug via INTRAVENOUS

## 2020-11-25 SURGICAL SUPPLY — 19 items
BNDG ELASTIC 4X5.8 VLCR STR LF (GAUZE/BANDAGES/DRESSINGS) ×2 IMPLANT
BNDG GAUZE ELAST 4 BULKY (GAUZE/BANDAGES/DRESSINGS) ×2 IMPLANT
COVER MAYO STAND STRL (DRAPES) ×2 IMPLANT
DRAPE IMP U-DRAPE 54X76 (DRAPES) ×2 IMPLANT
DRAPE U-SHAPE 47X51 STRL (DRAPES) ×2 IMPLANT
GAUZE SPONGE 4X4 12PLY STRL (GAUZE/BANDAGES/DRESSINGS) ×2 IMPLANT
GLOVE SURG ENC MOIS LTX SZ7 (GLOVE) ×2 IMPLANT
GLOVE SURG UNDER POLY LF SZ7.5 (GLOVE) ×2 IMPLANT
GOWN STRL REUS W/ TWL LRG LVL3 (GOWN DISPOSABLE) ×1 IMPLANT
GOWN STRL REUS W/ TWL XL LVL3 (GOWN DISPOSABLE) ×1 IMPLANT
GOWN STRL REUS W/TWL LRG LVL3 (GOWN DISPOSABLE) ×1
GOWN STRL REUS W/TWL XL LVL3 (GOWN DISPOSABLE) ×1
KIT BASIN OR (CUSTOM PROCEDURE TRAY) ×2 IMPLANT
NEEDLE HYPO 25X1 1.5 SAFETY (NEEDLE) ×2 IMPLANT
PACK ORTHO EXTREMITY (CUSTOM PROCEDURE TRAY) ×2 IMPLANT
PENCIL SMOKE EVACUATOR (MISCELLANEOUS) ×2 IMPLANT
SUT PROLENE 3 0 PS 2 (SUTURE) ×2 IMPLANT
SYR CONTROL 10ML LL (SYRINGE) ×2 IMPLANT
UNDERPAD 30X36 HEAVY ABSORB (UNDERPADS AND DIAPERS) ×2 IMPLANT

## 2020-11-25 NOTE — Progress Notes (Signed)
Orthopedic Tech Progress Note Patient Details:  Willie Collins Sep 24, 1960 [id number]  Ortho Devices Type of Ortho Device: CAM walker Ortho Device/Splint Location: left Ortho Device/Splint Interventions: Application   Post Interventions Patient Tolerated: Well Instructions Provided: Care of device   Saul Fordyce 11/25/2020, 3:46 PM

## 2020-11-25 NOTE — Op Note (Signed)
Surgeon: Surgeon(s): Candelaria Stagers, DPM  Assistants: None Pre-operative diagnosis: Left Foot Abscess  Post-operative diagnosis: same Procedure: Procedure(s) (LRB): IRRIGATION AND DEBRIDEMENT FOOT (Left)  Pathology:  ID Type Source Tests Collected by Time Destination  A : Left Foot Abscess Body Fluid PATH Cytology Misc. fluid AEROBIC/ANAEROBIC CULTURE W GRAM STAIN (SURGICAL/DEEP WOUND) Candelaria Stagers, [place] 11/25/2020 1421     Pertinent Intra-op findings: Moderate purulent drainage noted. All pus was decompressed.  Anesthesia: Choice  Hemostasis: None EBL: 50 cc Materials: 3-0 Prolene Injectables: 10 cc of half percent Marcaine plain Complications: None  Indications for surgery: A 60 y.o. male presents with left foot abscess. Patient has failed all conservative therapy including but not limited to IV antibiotics local wound care. He wishes to have surgical correction of the foot/deformity. It was determined that patient would benefit from left foot incision drainage, washout debridement. Informed surgical risk consent was reviewed and read aloud to the patient.  I reviewed the films.  I have discussed my findings with the patient in great detail.  I have discussed all risks including but not limited to infection, stiffness, scarring, limp, disability, deformity, damage to blood vessels and nerves, numbness, poor healing, need for braces, arthritis, chronic pain, amputation, death.  All benefits and realistic expectations discussed in great detail.  I have made no promises as to the outcome.  I have provided realistic expectations.  I have offered the patient a 2nd opinion, which they have declined and assured me they preferred to proceed despite the risks   Procedure in detail: The patient was both verbally and visually identified by myself, the nursing staff, and anesthesia staff in the preoperative holding area. They were then transferred to the operating room and placed on the operative  table in supine position.  Attention was directed to the medial left foot, a skin marker was used to delineate the curvilinear incision across the left foot abscess.  #15 blade was used to delineate the incision from epidermis to dermis.  At this time purulent drainage was expressed.  The purulent drainage was cultured in standard technique without complication and sent to microbiology.  It appears that this abscess was superficial to muscle.  Did not incorporate the bone.  No involvement of the deeper structures noted.  Using hemostat all tissue planes were thoroughly decompressed to a point of no further purulent drainage.  Using saline with 3 L bag, a pulse lavage was utilized to thoroughly irrigate the wound in standard technique.  At this time further decompression was performed after the irrigation.  No further purulent drainage was noted.  At this time the incision/the wound appeared to be clear of infection.  Using 3-0 Prolene the incision was closed in simple interrupted suture technique.  The Betadine wet-to-dry dressing was applied followed by Kerlix and Ace bandage.  All bony prominences were adequately padded. At the conclusion of the procedure the patient was awoken from anesthesia and found to have tolerated the procedure well any complications. There were transferred to PACU with vital signs stable and vascular status intact.  Nicholes Rough, DPM

## 2020-11-25 NOTE — Progress Notes (Signed)
PROGRESS NOTE    Author [place]  UMP:[id number] DOB: Oct 13, 1960 DOA: 11/24/2020 PCP: Patient, No Pcp Per   Brief Narrative: The patient is a 60 year old Hispanic male with a past medical history significant for but not limited to anxiety, depression, gout as well as other comorbidities who presented to the ED with left foot pain.  He was recently seen at [facility] health for left pain and swelling with onset about 2 weeks ago at which time he had negative plain films was noted to have a significant abscess on the medial aspect of his left foot.  He had an I&D at that time and was discharged with 5 days of Bactrim but since then has completed Bactrim however his pain has been severe and has worsened despite finishing antibiotics.  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  He has severe anxiety over having another I&D again due to pain left, and he is very anxious.  He recently moved to the area does not have a PCP.  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  In the ED he received Dilaudid, morphine and vancomycin.  Please see the picture in the chart taken by Dr. Whitney Post.  Podiatry Dr. Samuella Cota has been consulted and plans for incision and drainage and patient is currently n.p.o. after midnight.  We will continue IV vancomycin and then failed outpatient Bactrim. Patient posted for Surgery later today.   Assessment & Plan:   Principal Problem:   Foot abscess, left Active Problems:   Anxiety   Hyperkalemia  Left foot abscess, failed outpatient antibiotics -Afebrile without leukocytosis, not septic; his temperature was 97.6 and his WBC was six-point -Failed outpatient Bactrim -Left foot XR with extensive vascular calcifications which may be  hindering antibiotic therapy and may need vascular eval at some point outpatient -Discussed with Dr. Samuella Cota, podiatry, plan is for I&D today and timing to be done by Dr. Samuella Cota.  Patient still n.p.o. currently -IVF stopped but will resume with NS at 75 mL/hr -Continue IV Vancomycin -Further care per podiatry  Anxiety -Severe anxiety over upcoming procedure leading to generalized shaking -Started on Ativan 0.5 mg twice daily as needed -Provided reassurance  Hypertension likely related to pain, anxiety possibly hypertensive -Pain and anxiety control -As needed hydralazine -TOC consult, patient needs PCP -Continue to monitor blood pressures per protocol -Last blood pressure reading was 168/83  Asymptomatic hyperkalemia -K+ was 5.6 on Admission and is now 3.6 -Check EKG  -IV fluids were given and now discontinued; will give a 500 mL bolus yesterday as well; Now on NS at 75 mL/hr  Hypomagnesemia -Patient's magnesium level was 1.6 -Replete with IV mag sulfate 2 g -Continue to monitor and replete as necessary -Repeat magnesium level in a.m.  Hyperbilirubinemia -Patient's T bili was 1.6 and is now trending down to 1.305.  Likely reactive -C/w NS at 75 mL/hr -Continue to monitor and trend and repeat CMP in a.m.  Abnormal AST  -Proved and normalized -Patient's AST went from 35 and is now 22 -Continue to monitor and trend -Repeat CMP in a.m.  Metabolic Acidosis -Mild -Patient's CO2 is 21, anion gap is 14, chloride level is 100 -IV fluid hydration had stopped but will resume -Continue to monitor and trend and repeat CMP  in a.m.  Hyperglycemia -Patient's Glucose level on Admission was 115 and 98 this AM on CMP -Check HbA1c -Continue to Monitor Blood Sugars Carefully and if necessary will place on Sensitive Novolog SSI   DVT prophylaxis: Heparin 5,000 units sq q8h Code Status: FULL CODE  Family Communication: No family present at bedside Disposition Plan: Pending Surgical  Intervention and Improvement in his Pain  Status is: Inpatient  Remains inpatient appropriate because:Unsafe d/c plan, IV treatments appropriate due to intensity of illness or inability to take PO and Inpatient level of care appropriate due to severity of illness   Dispo: The patient is from: Home              Anticipated d/c is to: TBD              Patient currently is not medically stable to d/c.   Difficult to place patient No  Consultants:   Podiatry    Procedures: None  Antimicrobials: Anti-infectives (From admission, onward)   Start     Dose/Rate Route Frequency Ordered Stop   11/25/20 0000  vancomycin (VANCOREADY) IVPB 750 mg/150 mL        750 mg 150 mL/hr over 60 Minutes Intravenous Every 12 hours 11/24/20 1627     11/24/20 1630  vancomycin (VANCOCIN) IVPB 1000 mg/200 mL premix  Status:  Discontinued        1,000 mg 200 mL/hr over 60 Minutes Intravenous  Once 11/24/20 1616 11/24/20 1619   11/24/20 1515  vancomycin (VANCOCIN) IVPB 1000 mg/200 mL premix        1,000 mg 200 mL/hr over 60 Minutes Intravenous  Once 11/24/20 1500 11/24/20 1621        Subjective: Seen and examined at bedside and he was very anxious and complaining of significant pain and feel that his foot has gotten worse.  No nausea or vomiting.  States that he cannot even bear weight on his foot due to the pain.  Denies any lightheadedness or dizziness.  No other concerns or complaints at this time.  Objective: Vitals:   11/24/20 1729 11/24/20 2113 11/25/20 0121 11/25/20 0531  BP: (!) 168/97 (!) 165/93 (!) 173/95 (!) 168/83  Pulse: 60 (!) 58 (!) 58 66  Resp: 18 17 17 18   Temp: 97.8 F (36.6 C) 98.1 F (36.7 C) 98.5 F (36.9 C) 97.6 F (36.4 C)  TempSrc: Oral  Oral   SpO2:  97% 99% 99%  Weight: 68 kg     Height: 5\' 6"  (1.676 m)       Intake/Output Summary (Last 24 hours) at 11/25/2020 1222 Last data filed at 11/25/2020 0531 Gross per 24 hour  Intake 590 ml  Output 1500 ml  Net -910 ml    Filed Weights   11/24/20 1729  Weight: 68 kg   Examination: Physical Exam:  Constitutional: Thin anxious Hispanic male in mild distress and appears uncomfortable and in pain Eyes: Lids and conjunctivae normal, sclerae anicteric  ENMT: External Ears, Nose appear normal. Grossly normal hearing.  Neck: Appears normal, supple, no cervical masses, normal ROM, no appreciable thyromegaly; no JVD Respiratory: Diminished to auscultation bilaterally, no wheezing, rales, rhonchi or crackles. Normal respiratory effort and patient is not tachypenic. No accessory muscle use.  Cardiovascular: RRR, no murmurs / rubs / gallops. S1 and S2 auscultated.  Abdomen: Soft, non-tender, non-distended. Bowel sounds positive.  GU: Deferred. Musculoskeletal: No clubbing / cyanosis of digits/nails. Left ankle swollen and painful and erythematous Skin: No rashes noted but has  a left erythematous ankle and an indurated area in the left ankle; Warm and dry.  Neurologic: CN 2-12 grossly intact with no focal deficits. Romberg sign and cerebellar reflexes not assessed.  Psychiatric: Normal judgment and insight. Alert and oriented x 3. Anxious mood and appropriate affect.   Data Reviewed: I have personally reviewed following labs and imaging studies  CBC: Recent Labs  Lab 11/24/20 1336 11/25/20 0902  WBC 9.2 6.2  NEUTROABS 7.4 4.4  HGB 17.5* 14.2  HCT 51.2 42.5  MCV 92.4 93.0  PLT 267 205   Basic Metabolic Panel: Recent Labs  Lab 11/24/20 1336 11/25/20 0902  NA 136 135  K 5.6* 3.6  CL 102 100  CO2 22 21*  GLUCOSE 115* 98  BUN 14 11  CREATININE 0.88 0.72  CALCIUM 9.9 8.9  MG  --  1.6*  PHOS  --  2.7   GFR: Estimated Creatinine Clearance: 88.6 mL/min (by C-G formula based on SCr of 0.72 mg/dL). Liver Function Tests: Recent Labs  Lab 11/24/20 1336 11/25/20 0902  AST 55* 22  ALT 31 20  ALKPHOS 110 85  BILITOT 1.6* 1.3*  PROT 8.2* 6.7  ALBUMIN 4.3 3.6   No results for input(s): LIPASE,  AMYLASE in the last 168 hours. No results for input(s): AMMONIA in the last 168 hours. Coagulation Profile: No results for input(s): INR, PROTIME in the last 168 hours. Cardiac Enzymes: No results for input(s): CKTOTAL, CKMB, CKMBINDEX, TROPONINI in the last 168 hours. BNP (last 3 results) No results for input(s): PROBNP in the last 8760 hours. HbA1C: No results for input(s): HGBA1C in the last 72 hours. CBG: No results for input(s): GLUCAP in the last 168 hours. Lipid Profile: No results for input(s): CHOL, HDL, LDLCALC, TRIG, CHOLHDL, LDLDIRECT in the last 72 hours. Thyroid Function Tests: No results for input(s): TSH, T4TOTAL, FREET4, T3FREE, THYROIDAB in the last 72 hours. Anemia Panel: No results for input(s): VITAMINB12, FOLATE, FERRITIN, TIBC, IRON, RETICCTPCT in the last 72 hours. Sepsis Labs: Recent Labs  Lab 11/24/20 1707  LATICACIDVEN 1.6    Recent Results (from the past 240 hour(s))  Resp Panel by RT-PCR (Flu A&B, Covid) Nasopharyngeal Swab     Status: None   Collection Time: 11/24/20  3:12 PM   Specimen: Nasopharyngeal Swab; Nasopharyngeal(NP) swabs in vial transport medium  Result Value Ref Range Status   SARS Coronavirus 2 by RT PCR NEGATIVE NEGATIVE Final    Comment: (NOTE) SARS-CoV-2 target nucleic acids are NOT DETECTED.  The SARS-CoV-2 RNA is generally detectable in upper respiratory specimens during the acute phase of infection. The lowest concentration of SARS-CoV-2 viral copies this assay can detect is 138 copies/mL. A negative result does not preclude SARS-Cov-2 infection and should not be used as the sole basis for treatment or other patient management decisions. A negative result may occur with  improper specimen collection/handling, submission of specimen other than nasopharyngeal swab, presence of viral mutation(s) within the areas targeted by this assay, and inadequate number of viral copies(<138 copies/mL). A negative result must be combined  with clinical observations, patient history, and epidemiological information. The expected result is Negative.  Fact Sheet for Patients:  BloggerCourse.com  Fact Sheet for Healthcare Providers:  SeriousBroker.it  This test is no t yet approved or cleared by the [place] FDA and  has been authorized for detection and/or diagnosis of SARS-CoV-2 by FDA under an Emergency Use Authorization (EUA). This EUA will remain  in effect (meaning this test can be used)  for the duration of the COVID-19 declaration under Section 564(b)(1) of the Act, 21 U.S.C.section 360bbb-3(b)(1), unless the authorization is terminated  or revoked sooner.       Influenza A by PCR NEGATIVE NEGATIVE Final   Influenza B by PCR NEGATIVE NEGATIVE Final    Comment: (NOTE) The Xpert Xpress SARS-CoV-2/FLU/RSV plus assay is intended as an aid in the diagnosis of influenza from Nasopharyngeal swab specimens and should not be used as a sole basis for treatment. Nasal washings and aspirates are unacceptable for Xpert Xpress SARS-CoV-2/FLU/RSV testing.  Fact Sheet for Patients: BloggerCourse.comhttps://www.fda.gov/media/152166/download  Fact Sheet for Healthcare Providers: SeriousBroker.ithttps://www.fda.gov/media/152162/download  This test is not yet approved or cleared by the [place] FDA and has been authorized for detection and/or diagnosis of SARS-CoV-2 by FDA under an Emergency Use Authorization (EUA). This EUA will remain in effect (meaning this test can be used) for the duration of the COVID-19 declaration under Section 564(b)(1) of the Act, 21 U.S.C. section 360bbb-3(b)(1), unless the authorization is terminated or revoked.  Performed at [facility], [address] [place], [place] [address]      RN Pressure Injury Documentation:     Estimated body mass index is 24.21 kg/m as calculated from the following:   Height as of this encounter: 5\' 6"  (1.676  m).   Weight as of this encounter: 68 kg.  Malnutrition Type:   Malnutrition Characteristics:   Nutrition Interventions:    Radiology Studies: DG Ankle Complete Left  Result Date: 11/24/2020 CLINICAL DATA:  Pain with reported recent drainage of lesion from the medial left ankle region EXAM: LEFT ANKLE COMPLETE - 3+ VIEW COMPARISON:  None. FINDINGS: Frontal, oblique, and lateral views were obtained. No evident fracture or joint effusion. Joint spaces appear normal. No erosive change or bony destruction. There is no soft tissue air. There are multiple foci of arterial vascular calcification. IMPRESSION: No fracture or erosion. No bony destruction. No arthropathy appreciable. Ankle mortise appears intact. Multiple foci of arterial vascular calcification noted. Question underlying diabetes mellitus. Electronically Signed   By: Bretta BangWilliam  Woodruff III M.D.   On: 11/24/2020 14:06   DG Foot Complete Left  Result Date: 11/24/2020 CLINICAL DATA:  Pain EXAM: LEFT FOOT - COMPLETE 3+ VIEW COMPARISON:  None. FINDINGS: Frontal, oblique, and lateral views were obtained. There is no fracture or dislocation. There is moderate narrowing in the first MTP joint with spurring in this area. Calcification is noted in the first IP joint without appreciable joint space narrowing. No erosive changes or bony destruction. There are multiple foci of arterial vascular calcification. No evidence soft tissue air. IMPRESSION: Osteoarthritic change first MTP joint. Calcification in the first IP joint is likely of arthropathic etiology. No fracture or dislocation. No bony destruction or erosion. Extensive arterial vascular calcification noted. Electronically Signed   By: Bretta BangWilliam  Woodruff III M.D.   On: 11/24/2020 14:04   Scheduled Meds: . heparin  5,000 Units Subcutaneous Q8H   Continuous Infusions: . vancomycin 750 mg (11/25/20 0048)    LOS: 0 days   Merlene Laughtermair Latif Klohe Lovering, DO [facility] PAGER is on AMION  If  7PM-7AM, please contact night-coverage [email or web address]

## 2020-11-25 NOTE — Interval H&P Note (Signed)
History and Physical Interval Note:  11/25/2020 1:30 PM  Willie Collins  has presented today for surgery, with the diagnosis of Left Foot Abscess.  The various methods of treatment have been discussed with the patient and family. After consideration of risks, benefits and other options for treatment, the patient has consented to  Procedure(s): IRRIGATION AND DEBRIDEMENT FOOT (Left) incision and drainage as a surgical intervention.  The patient's history has been reviewed, patient examined, no change in status, stable for surgery.  I have reviewed the patient's chart and labs.  Questions were answered to the patient's satisfaction.    Informed surgical risk consent was reviewed and read aloud to the patient.  I reviewed the films.  I have discussed my findings with the patient in great detail.  I have discussed all risks including but not limited to infection, stiffness, scarring, limp, disability, deformity, damage to blood vessels and nerves, numbness, poor healing, need for braces, arthritis, chronic pain, amputation, death.  All benefits and realistic expectations discussed in great detail.  I have made no promises as to the outcome.  I have provided realistic expectations.  I have offered the patient a 2nd opinion, which they have declined and assured me they preferred to proceed despite the risks    Candelaria Stagers

## 2020-11-25 NOTE — Consult Note (Signed)
  Subjective:  Patient ID: Willie Collins, male    DOB: 1961/09/12,  MRN: [id number]  The patient is a 60 year old Hispanic male with a past medical history significant for but not limited to anxiety, depression, gout as well as other comorbidities who presented to the ED with left foot pain.    He was recently seen at [facility] health for left foot abscess which afterwards underwent incision and drainage washout debridement.  Patient is presenting again today for recurrent abscess formation to the left foot.  He has moved recently.  He has failed outpatient Bactrim therapy.  He denies any other acute complaints.  He knee denies any nausea fever chills vomiting. Objective:   Vitals:   11/25/20 0121 11/25/20 0531  BP: (!) 173/95 (!) 168/83  Pulse: (!) 58 66  Resp: 17 18  Temp: 98.5 F (36.9 C) 97.6 F (36.4 C)  SpO2: 99% 99%   General AA&O x3. Normal mood and affect.  Vascular Dorsalis pedis and posterior tibial pulses faintly palpable. Brisk capillary refill slightly decreased. Pedal hair not present.  Neurologic Epicritic sensation grossly intact.  Dermatologic  medial foot abscess formation noted with some redness.  The redness appears to be decreasing.  Area of fluctuance noted to the medial aspect.  Appears to be/clinically palpable to be superficial.  No open wound or ulceration noted.  No malodor present.      Orthopedic: MMT 5/5 in dorsiflexion, plantarflexion, inversion, and eversion. Normal joint ROM without pain or crepitus.   Radiographs: 3 views of skeletally mature adult left foot: Osteoarthritic first metatarsophalangeal joint with extensive vascular calcification noted.  No fractures noted.  No osteomyelitis present. Assessment & Plan:  Patient was evaluated and treated and all questions answered.  Left foot abscess (with history of previous incision and drainage) -Patient was seen at bedside and all questions and concerns were addressed. -I discussed  with him that patient will benefit from incision and drainage washout debridement given that he still has an abscess formation to the left foot.  I will plan on draining the abscess out thoroughly and if it looks clear of infection we will planning on primarily repairing ankle/closing it.  I discussed this with the patient in extensive detail.  Patient states understanding. -He has remained n.p.o. after midnight. -He can be weightbearing as tolerated after the surgery with a cam boot. -He will not need any local wound care.  I will plan on changing the bandages when I see him again in clinic. -He will follow up with me 1 week from being discharged.  Our office will reach out to them to make the appointment.  Candelaria Stagers, DPM  Accessible via secure chat for questions or concerns.

## 2020-11-25 NOTE — Anesthesia Preprocedure Evaluation (Addendum)
Anesthesia Evaluation  Patient identified by MRN, date of birth, ID band Patient awake    Reviewed: Allergy & Precautions, H&P , NPO status , Patient's Chart, lab work & pertinent test results  Airway Mallampati: III  TM Distance: >3 FB Neck ROM: Full    Dental no notable dental hx. (+) Teeth Intact, Dental Advisory Given   Pulmonary neg pulmonary ROS,    Pulmonary exam normal breath sounds clear to auscultation       Cardiovascular hypertension,  Rhythm:Regular Rate:Normal     Neuro/Psych Anxiety negative neurological ROS     GI/Hepatic negative GI ROS, Neg liver ROS,   Endo/Other  negative endocrine ROS  Renal/GU negative Renal ROS  negative genitourinary   Musculoskeletal   Abdominal   Peds  Hematology negative hematology ROS (+)   Anesthesia Other Findings   Reproductive/Obstetrics negative OB ROS                            Anesthesia Physical Anesthesia Plan  ASA: II  Anesthesia Plan: General   Post-op Pain Management:    Induction: Intravenous  PONV Risk Score and Plan: 3 and Ondansetron, Dexamethasone and Midazolam  Airway Management Planned: LMA  Additional Equipment:   Intra-op Plan:   Post-operative Plan: Extubation in OR  Informed Consent: I have reviewed the patients History and Physical, chart, labs and discussed the procedure including the risks, benefits and alternatives for the proposed anesthesia with the patient or authorized representative who has indicated his/her understanding and acceptance.     Dental advisory given  Plan Discussed with: CRNA  Anesthesia Plan Comments:         Anesthesia Quick Evaluation

## 2020-11-25 NOTE — Transfer of Care (Signed)
Immediate Anesthesia Transfer of Care Note  Patient: Willie Collins  Procedure(s) Performed: IRRIGATION AND DEBRIDEMENT FOOT (Left )  Patient Location: PACU  Anesthesia Type:General  Level of Consciousness: awake, alert  and oriented  Airway & Oxygen Therapy: Patient Spontanous Breathing and Patient connected to face mask oxygen  Post-op Assessment: Report given to RN, Post -op Vital signs reviewed and stable and Patient moving all extremities X 4  Post vital signs: Reviewed and stable  Last Vitals:  Vitals Value Taken Time  BP 143/87 11/25/20 1441  Temp    Pulse 130 11/25/20 1442  Resp 19 11/25/20 1442  SpO2 77 % 11/25/20 1442  Vitals shown include unvalidated device data.  Last Pain:  Vitals:   11/25/20 1330  TempSrc:   PainSc: 10-Worst pain ever      Patients Stated Pain Goal: 8 (11/25/20 1330)  Complications: No complications documented.

## 2020-11-25 NOTE — Anesthesia Procedure Notes (Signed)
Procedure Name: LMA Insertion Date/Time: 11/25/2020 2:08 PM Performed by: Nelle Don, CRNA Pre-anesthesia Checklist: Emergency Drugs available, Patient identified, Suction available and Patient being monitored Patient Re-evaluated:Patient Re-evaluated prior to induction Oxygen Delivery Method: Circle system utilized Induction Type: IV induction LMA: LMA inserted LMA Size: 4.0 Number of attempts: 1 Dental Injury: Teeth and Oropharynx as per pre-operative assessment

## 2020-11-25 NOTE — Anesthesia Postprocedure Evaluation (Signed)
Anesthesia Post Note  Patient: Willie Collins  Procedure(s) Performed: IRRIGATION AND DEBRIDEMENT FOOT (Left )     Patient location during evaluation: PACU Anesthesia Type: General Level of consciousness: sedated Pain management: pain level controlled Vital Signs Assessment: post-procedure vital signs reviewed and stable Respiratory status: spontaneous breathing and respiratory function stable Cardiovascular status: stable Postop Assessment: no apparent nausea or vomiting Anesthetic complications: no   No complications documented.  Last Vitals:  Vitals:   11/25/20 1545 11/25/20 1608  BP: (!) 153/87 (!) 179/101  Pulse: 76 64  Resp: 17 16  Temp:  37 C  SpO2: 98% 97%    Last Pain:  Vitals:   11/25/20 1731  TempSrc:   PainSc: 7                  [name]

## 2020-11-26 ENCOUNTER — Encounter (HOSPITAL_COMMUNITY): Payer: Self-pay | Admitting: Podiatry

## 2020-11-26 DIAGNOSIS — R42 Dizziness and giddiness: Secondary | ICD-10-CM

## 2020-11-26 DIAGNOSIS — M79672 Pain in left foot: Secondary | ICD-10-CM

## 2020-11-26 LAB — COMPREHENSIVE METABOLIC PANEL
ALT: 18 U/L (ref 0–44)
AST: 21 U/L (ref 15–41)
Albumin: 3.5 g/dL (ref 3.5–5.0)
Alkaline Phosphatase: 84 U/L (ref 38–126)
Anion gap: 9 (ref 5–15)
BUN: 13 mg/dL (ref 6–20)
CO2: 26 mmol/L (ref 22–32)
Calcium: 8.5 mg/dL — ABNORMAL LOW (ref 8.9–10.3)
Chloride: 100 mmol/L (ref 98–111)
Creatinine, Ser: 0.79 mg/dL (ref 0.61–1.24)
GFR, Estimated: >60 mL/min (ref >60)
Glucose, Bld: 94 mg/dL (ref 70–99)
Potassium: 3.5 mmol/L (ref 3.5–5.1)
Sodium: 135 mmol/L (ref 135–145)
Total Bilirubin: 1.2 mg/dL (ref 0.3–1.2)
Total Protein: 6.6 g/dL (ref 6.5–8.1)

## 2020-11-26 LAB — CBC WITH DIFFERENTIAL/PLATELET
Abs Immature Granulocytes: 0.02 10*3/uL (ref 0.00–0.07)
Basophils Absolute: 0 10*3/uL (ref 0.0–0.1)
Basophils Relative: 0 %
Eosinophils Absolute: 0.1 10*3/uL (ref 0.0–0.5)
Eosinophils Relative: 2 %
HCT: 41.8 % (ref 39.0–52.0)
Hemoglobin: 13.8 g/dL (ref 13.0–17.0)
Immature Granulocytes: 0 %
Lymphocytes Relative: 21 %
Lymphs Abs: 1.1 10*3/uL (ref 0.7–4.0)
MCH: 31.4 pg (ref 26.0–34.0)
MCHC: 33 g/dL (ref 30.0–36.0)
MCV: 95.2 fL (ref 80.0–100.0)
Monocytes Absolute: 0.5 10*3/uL (ref 0.1–1.0)
Monocytes Relative: 9 %
Neutro Abs: 3.4 10*3/uL (ref 1.7–7.7)
Neutrophils Relative %: 68 %
Platelets: 211 10*3/uL (ref 150–400)
RBC: 4.39 MIL/uL (ref 4.22–5.81)
RDW: 12.9 % (ref 11.5–15.5)
WBC: 5.1 10*3/uL (ref 4.0–10.5)
nRBC: 0 % (ref 0.0–0.2)

## 2020-11-26 LAB — HEMOGLOBIN A1C
Hgb A1c MFr Bld: 5.4 % (ref 4.8–5.6)
Mean Plasma Glucose: 108.28 mg/dL

## 2020-11-26 LAB — MAGNESIUM: Magnesium: 1.6 mg/dL — ABNORMAL LOW (ref 1.7–2.4)

## 2020-11-26 LAB — PHOSPHORUS: Phosphorus: 3.5 mg/dL (ref 2.5–4.6)

## 2020-11-26 MED ORDER — LABETALOL HCL 5 MG/ML IV SOLN
5.0000 mg | INTRAVENOUS | Status: DC | PRN
  Administered 2020-11-26 (×3): 5 mg via INTRAVENOUS
  Filled 2020-11-26 (×3): qty 4

## 2020-11-26 MED ORDER — OXYCODONE HCL 5 MG PO TABS
5.0000 mg | ORAL_TABLET | Freq: Four times a day (QID) | ORAL | Status: DC | PRN
  Administered 2020-11-26 – 2020-11-28 (×9): 5 mg via ORAL
  Filled 2020-11-26 (×10): qty 1

## 2020-11-26 MED ORDER — SODIUM CHLORIDE 0.9 % IV BOLUS
500.0000 mL | Freq: Once | INTRAVENOUS | Status: AC
  Administered 2020-11-26: 500 mL via INTRAVENOUS

## 2020-11-26 MED ORDER — MAGNESIUM SULFATE 2 GM/50ML IV SOLN
2.0000 g | Freq: Once | INTRAVENOUS | Status: AC
  Administered 2020-11-26: 2 g via INTRAVENOUS
  Filled 2020-11-26: qty 50

## 2020-11-26 NOTE — TOC Initial Note (Signed)
Transition of Care [facility]) - Initial/Assessment Note   Patient Details  Name: Willie Collins MRN: [id number] Date of Birth: 11-12-1960  Transition of [facility]) CM/SW Contact:    Sherie Don, LCSW Phone Number: 11/26/2020, 2:40 PM  Clinical Narrative: Patient is a 60 year old male who was admitted for a left foot abscess. TOC consulted for PCP needs. CSW met with patient to discuss referral to [facility] and Wellness. Patient is agreeable to referral and asked about applying for Medicaid. Per chart review, patient is being followed/reviewed by [facility]. CSW explained patient can also reach out to [facility] to complete a Medicaid application, but it will take time to determine if he is eligible or not. Patient also agreeable to referrals for charity [facility] and DME.  CSW scheduled hospital follow up appointment for January 01, 2021 at 1:50pm as this is the earliest available appointment. Patient placed on waitlist and will be contacted if an earlier appointment becomes available. Appointment added to AVS.  CSW made referral to [facility] with [employer] for 3N1 and walker; awaiting confirmation that [employer] can provide DME. CSW made referral to Ronalee Belts with [facility] for HHPT and possibly Therapist, sports. Ronalee Belts to submit request to area manager, Manuela Schwartz, due to staffing delays. TOC awaiting follow up from [facility].  Expected Discharge Plan: [place] Barriers to Discharge: Continued Medical Work up  Patient Goals and CMS Choice Choice offered to / list presented to : Patient  Expected Discharge Plan and Services Expected Discharge Plan: [place] In-house Referral: Clinical Social Work Post Acute Care Choice: Durable Medical [place] arrangements for the past 2 months: [place]              DME Arranged: Gilford Rile rolling,3-N-1 Date DME Agency Contacted: 11/26/20 Representative spoke with at DME Agency: Thedore Mins  Prior Living  Arrangements/Services Living arrangements for the past 2 months: [place] Patient language and need for interpreter reviewed:: Yes Do you feel safe going back to the place where you live?: Yes      Need for Family Participation in Patient Care: No (Comment) Care giver support system in place?: Yes (comment) Criminal Activity/Legal Involvement Pertinent to Current Situation/Hospitalization: No - Comment as needed  Activities of Daily Living Home Assistive Devices/Equipment: None ADL Screening (condition at time of admission) Patient's cognitive ability adequate to safely complete daily activities?: Yes Is the patient deaf or have difficulty hearing?: No Does the patient have difficulty seeing, even when wearing glasses/contacts?: No Does the patient have difficulty concentrating, remembering, or making decisions?: No Patient able to express need for assistance with ADLs?: Yes Does the patient have difficulty dressing or bathing?: No Independently performs ADLs?: Yes (appropriate for developmental age) Does the patient have difficulty walking or climbing stairs?: No Weakness of Legs: None Weakness of Arms/Hands: None  Permission Sought/Granted Permission sought to share information with : Other (comment) Permission granted to share information with : Yes, Verbal Permission Granted Permission granted to share info w AGENCY: [facility], [facility] agencies  Emotional Assessment Appearance:: Appears stated age Attitude/Demeanor/Rapport: Engaged Affect (typically observed): Accepting Orientation: : Oriented to Self,Oriented to Place,Oriented to  Time,Oriented to Situation Alcohol / Substance Use: Not Applicable Psych Involvement: No (comment)  Admission diagnosis:  Abscess [L02.91] Foot abscess, left [L02.612] Patient Active Problem List   Diagnosis Date Noted  . Foot abscess, left 11/24/2020  . Anxiety 11/24/2020  . Hyperkalemia 11/24/2020  PCP:   Patient, No Pcp Per Pharmacy:   [facility] [address] [place] - [address] [place] [place] [address] Phone: [phone] Fax: [phone]  Readmission Risk Interventions No flowsheet data found.

## 2020-11-26 NOTE — Evaluation (Signed)
Physical Therapy Evaluation Patient Details Name: Willie Collins MRN: [id number] DOB: October 22, 1960 Today's Date: 11/26/2020   History of Present Illness  60 year old male with past medical history of anxiety, depression, gout that presents to the ED with left foot pain. s/p I &D L foot on 11/25/20.  Clinical Impression  Pt admitted with above diagnosis. Pt ambulated 24' with RW, distance limited by L foot pain. During final few steps of ambulation, pt became diaphoretic and dizzy. Seated in a recliner immediately after ambulation, BP was 159/100, SaO2 98% on room air, HR 65. Pt reports he lives in a "wood house" behind his daughter's home, his house doesn't have a bathroom. He will need to be able to manage several stairs to access the bathroom in his daughter's home. At present he is not mobilizing well enough to DC home, where he will only have assistance when his daughter gets home from work. Pt puts forth good effort. Good progress expected.  Pt currently with functional limitations due to the deficits listed below (see PT Problem List). Pt will benefit from skilled PT to increase their independence and safety with mobility to allow discharge to the venue listed below.       Follow Up Recommendations Home health PT    Equipment Recommendations  Rolling walker with 5" wheels    Recommendations for Other Services       Precautions / Restrictions Precautions Precautions: Fall Restrictions Weight Bearing Restrictions: No (none stated in orders)      Mobility  Bed Mobility Overal bed mobility: Modified Independent             General bed mobility comments: used bed rail, HOB up ~35*    Transfers Overall transfer level: Needs assistance   Transfers: Sit to/from Stand Sit to Stand: Min guard         General transfer comment: VCs hand placement, min/guard safety  Ambulation/Gait Ambulation/Gait assistance: Min guard;Supervision Gait Distance (Feet): 24 Feet Assistive  device: Rolling walker (2 wheeled) Gait Pattern/deviations: Step-to pattern;Decreased step length - right;Decreased step length - left;Decreased stance time - left;Antalgic Gait velocity: decr   General Gait Details: distance limited by pain in L foot, pt was diaphoretic and dizzy at end of ambulation so assisted him to recliner where BP was 159/100, HR 65, SaO2 98% on room air  Stairs            Wheelchair Mobility    Modified Rankin (Stroke Patients Only)       Balance Overall balance assessment: Modified Independent                                           Pertinent Vitals/Pain Pain Assessment: 0-10 Pain Score: 9  Pain Location: L foot Pain Descriptors / Indicators: Sharp;Grimacing Pain Intervention(s): Limited activity within patient's tolerance;Monitored during session;Premedicated before session;Repositioned (elevated with 2 pillows)    Home Living Family/patient expects to be discharged to:: Private residence Living Arrangements: Children Available Help at Discharge: Family;Available PRN/intermittently   Home Access: Stairs to enter   Entrance Stairs-Number of Steps: 2 Home Layout: One level Home Equipment: None Additional Comments: pt lives in "wood house" behind daughter's house. His house doesn't have a bathroom, he has to walk to her house to access bathroom. No steps to enter his house, but 2 vs. 5 steps to enter daughter's home.    Prior Function Level of  Independence: Independent         Comments: works in Print[employer]        Extremity/Trunk Assessment   Upper Extremity Assessment Upper Extremity Assessment: Overall WFL for tasks assessed    Lower Extremity Assessment Lower Extremity Assessment: LLE deficits/detail LLE Deficits / Details: knee and hip WNL, ankle NT 2* pain. Pt reports sensation intact to light touch at L toes. Ankle wrapped in gauze and ace wrap. LLE: Unable to fully  assess due to pain LLE Sensation: WNL LLE Coordination: WNL    Cervical / Trunk Assessment Cervical / Trunk Assessment: Normal  Communication   Communication: No difficulties (speaks English very well)  Cognition Arousal/Alertness: Awake/alert Behavior During Therapy: WFL for tasks assessed/performed Overall Cognitive Status: Within Functional Limits for tasks assessed                                        General Comments      Exercises     Assessment/Plan    PT Assessment Patient needs continued PT services  PT Problem List Decreased mobility;Decreased activity tolerance;Decreased knowledge of use of DME;Decreased balance;Pain       PT Treatment Interventions Gait training;Stair training;Functional mobility training;Therapeutic activities;Therapeutic exercise;Balance training;Patient/family education    PT Goals (Current goals can be found in the Care Plan section)  Acute Rehab PT Goals 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 PT Goal Formulation: With patient Time For Goal Achievement: 12/10/20 Potential to Achieve Goals: Good    Frequency Min 5X/week   Barriers to discharge        Co-evaluation               AM-PAC PT "6 Clicks" Mobility  Outcome Measure Help needed turning from your back to your side while in a flat bed without using bedrails?: A Little Help needed moving from lying on your back to sitting on the side of a flat bed without using bedrails?: A Little Help needed moving to and from a bed to a chair (including a wheelchair)?: A Little Help needed standing up from a chair using your arms (e.g., wheelchair or bedside chair)?: A Little Help needed to walk in hospital room?: A Little Help needed climbing 3-5 steps with a railing? : A Lot 6 Click Score: 17    End of Session Equipment Utilized During Treatment: Gait belt Activity Tolerance: Patient limited by pain Patient left: in chair;with call bell/phone  within reach Nurse Communication: Mobility status PT Visit Diagnosis: Difficulty in walking, not elsewhere classified (R26.2);Pain Pain - Right/Left: Left Pain - part of body: Ankle and joints of foot    Time: 1204-1232 PT Time Calculation (min) (ACUTE ONLY): 28 min   Charges:   PT Evaluation $PT Eval Low Complexity: 1 Low PT Treatments $Gait Training: 8-22 mins       Ralene Bathe Kistler PT 11/26/2020  Acute Rehabilitation Services Pager [phone] Office [phone]

## 2020-11-26 NOTE — Progress Notes (Signed)
Patient in chair. Will complete orthostatic's when patient goes back to bed.   Patient is not dizzy/diaphoretic at this time.    [place], Charity fundraiser

## 2020-11-26 NOTE — Plan of Care (Signed)
  Problem: Health Behavior/Discharge Planning: Goal: Ability to manage health-related needs will improve Outcome: Progressing   

## 2020-11-26 NOTE — Evaluation (Signed)
Occupational Therapy Evaluation Patient Details Name: Willie Collins MRN: [id number] DOB: April 15, 1961 Today's Date: 11/26/2020    History of Present Illness 60 year old male with past medical history of anxiety, depression, gout that presents to the ED with left foot pain. s/p I &D L foot on 11/25/20.   Clinical Impression   Patient evaluated by Occupational Therapy with no further acute OT needs identified. All education has been completed and the patient has no further questions. Will defer to Physical therapy for mobility training to avoid duplication of services.  See below for any follow-up Occupational Therapy or equipment needs. OT is signing off. Thank you for this referral.     Follow Up Recommendations  Follow surgeon's recommendation for DC plan and follow-up therapies;No OT follow up    Equipment Recommendations  3 in 1 bedside commode (RW with 5" wheels)    Recommendations for Other Services       Precautions / Restrictions Precautions Precautions: Fall Required Braces or Orthoses: Other Brace Other Brace: LT cam-walker Restrictions Weight Bearing Restrictions: No (No WB orders.)      Mobility Bed Mobility Overal bed mobility: Modified Independent             General bed mobility comments: Pt up in recliner.    Transfers Overall transfer level: Needs assistance   Transfers: Sit to/from Stand Sit to Stand: Min guard         General transfer comment: Deferred due to recent pain and diaphoresis. OT session focused on DME/AE for bathroom, ADLs and fall prevention once home. Please refer to PT Evaluation for Mobility.    Balance Overall balance assessment: Modified Independent                                         ADL either performed or assessed with clinical judgement   ADL                                         General ADL Comments: Pt educated on doffing and donning cam-boot for ease and pt able to  demonstrate back understanding Mod I. Pt educated on use of a bedside commode in his home with plastic bag liner on bucket for easy clean up to compensate for no running water. Pt educated on all 3 uses of bedside commode and correct positioning.  Due to recent diaphoresis when ambulating with PT, recommended that pt use outside chairs every 10' or so from pt's home to daughter's home so that if pt experiences diaphoresis during ambulation he can sit quickly and call for help in order to prevent falling.  Deferred standing grooming, etc due to pt with recent ambulation with PT and subsequent pain. Recommended sitting for ADLs as possible.  Pt very receptive and had no further questions.     Vision   Vision Assessment?: No apparent visual deficits     Perception     Praxis      Pertinent Vitals/Pain Pain Assessment: 0-10 Pain Score: 6  Pain Location: LT foot at rest Pain Descriptors / Indicators: Sharp;Grimacing Pain Intervention(s): Limited activity within patient's tolerance;Monitored during session;Repositioned;Premedicated before session (Pt encouraged to elevate ad lib at [facility] and at home.)     Hand Dominance Right   Extremity/Trunk Assessment Upper Extremity Assessment Upper Extremity Assessment: Overall  WFL for tasks assessed   Lower Extremity Assessment Lower Extremity Assessment: Defer to PT evaluation LLE Deficits / Details: knee and hip WNL, ankle NT 2* pain. Pt reports sensation intact to light touch at L toes. Ankle wrapped in gauze and ace wrap. LLE: Unable to fully assess due to pain LLE Sensation: WNL LLE Coordination: WNL   Cervical / Trunk Assessment Cervical / Trunk Assessment: Normal   Communication Communication Communication: No difficulties (Speaking English fluently.)   Cognition Arousal/Alertness: Awake/alert Behavior During Therapy: WFL for tasks assessed/performed Overall Cognitive Status: Within Functional Limits for tasks assessed                                      General Comments       Exercises     Shoulder Instructions      Home Living Family/patient expects to be discharged to:: Private residence Living Arrangements: Children Available Help at Discharge: Family;Available PRN/intermittently   Home Access: Stairs to enter (Stairs to daughter's home. No stairs to pt's little house on dtr's land, however no running water at pt's house.) Entrance Stairs-Number of Steps: 2   Home Layout: One level     Bathroom Shower/Tub: Tub/shower unit (At daughter's home.)         Home Equipment: None   Additional Comments: pt lives in "wood house" behind daughter's house. His house doesn't have a bathroom or running water, he has to walk to her house to access bathroom. No steps to enter his house, but 2 vs. 5 steps to enter daughter's home. Distance b/t pt's and daughter's home about 50'-75'      Prior Functioning/Environment Level of Independence: Independent        Comments: works in Electronics engineer        OT Problem List:        OT Treatment/Interventions:      OT Goals(Current goals can be found in the care plan section) Acute Rehab OT Goals Patient Stated Goal: to return to work at [employer] OT Goal Formulation: All assessment and education complete, DC therapy  OT Frequency:     Barriers to D/C:            Co-evaluation              AM-PAC OT "6 Clicks" Daily Activity     Outcome Measure Help from another person eating meals?: None Help from another person taking care of personal grooming?: None Help from another person toileting, which includes using toliet, bedpan, or urinal?: A Little Help from another person bathing (including washing, rinsing, drying)?: A Little Help from another person to put on and taking off regular upper body clothing?: A Little Help from another person to put on and taking off regular lower body clothing?: A Little 6 Click Score: 20   End  of Session Equipment Utilized During Treatment:  (Cam-walker/boot)  Activity Tolerance: Patient tolerated treatment well Patient left: in chair;with call bell/phone within reach  OT Visit Diagnosis: Pain;Other abnormalities of gait and mobility (R26.89) Pain - Right/Left: Left Pain - part of body: Ankle and joints of foot                Time: 1335-1355 OT Time Calculation (min): 20 min Charges:  OT General Charges $OT Visit: 1 Visit OT Evaluation $OT Eval Low Complexity: 1 Low  Victorino Dike, OT Acute Rehab Services Office: [phone] 11/26/2020  Theodoro Clock 11/26/2020, 2:25 PM

## 2020-11-26 NOTE — Plan of Care (Signed)
  Problem: Education: Goal: Knowledge of General Education information will improve Description Including pain rating scale, medication(s)/side effects and non-pharmacologic comfort measures Outcome: Progressing   

## 2020-11-26 NOTE — Progress Notes (Signed)
PROGRESS NOTE    Willie Collins  TFT:[id number] DOB: [birth year] DOA: 11/24/2020 PCP: Patient, No Pcp Per   Brief Narrative: The patient is a 60 year old Hispanic male with a past medical history significant for but not limited to anxiety, depression, gout as well as other comorbidities who presented to the ED with left foot pain.  He was recently seen at [facility] health for left pain and swelling with onset about 2 weeks ago at which time he had negative plain films was noted to have a significant abscess on the medial aspect of his left foot.  He had an I&D at that time and was discharged with 5 days of Bactrim but since then has completed Bactrim however his pain has been severe and has worsened despite finishing antibiotics.  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  He has severe anxiety over having another I&D again due to pain left, and he is very anxious.  He recently moved to the area does not have a PCP.  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  In the ED he received Dilaudid, morphine and vancomycin.  Please see the picture in the chart taken by Dr. Whitney Post.  Podiatry Dr. Samuella Cota has been consulted and plans for incision and drainage and patient is currently n.p.o. after midnight.  We will continue IV vancomycin and then failed outpatient Bactrim. Patient posted for Surgery irrigation and debridement of his left foot abscess.  Recommending home health PT but while patient was working with PT became dizzy and lightheaded so we will watch him overnight and give him increased fluid hydration and a 500 mL bolus as well as Tylenol.  We will repeat orthostatics in the a.m. and have PT reevaluate if his pain is improved can likely be  discharged tomorrow  Assessment & Plan:   Principal Problem:   Foot abscess, left Active Problems:   Anxiety   Hyperkalemia  Left foot abscess, failed outpatient antibiotics status post irrigation and debridement of the left foot postoperative day 1 -Afebrile without leukocytosis, not septic; his temperature was 97.6 and his WBC was six-point -Failed outpatient Bactrim -Left foot XR with extensive vascular calcifications which may be hindering antibiotic therapy and may need vascular eval at some point outpatient -Dr. Nicholes Rough has taken over podiatrist and took the patient to surgery; PT evaluated and feel that he is not safe for home discharge today but will reevaluate him in the morning -IVF stopped but will resume with NS at 75 mL/hr and increase to 100 MLS per hour given that he was dizzy this afternoon -Continue IV Vancomycin while hospitalized and further antibiotic care per podiatry -Further care per podiatry -PT OT recommending home health PT at discharge but do not feel that he is safe to be discharged today given his dizziness and lightheadedness so we will watch him again overnight -Patient's anaerobic/aerobic cultures are pending currently show no growth to date -Pain was still fairly uncontrolled so we will continue with IV pain medications and also will add oxycodone IR 5 mg every 6 as needed for moderate pain -Dr. Nicholes Rough states that he had already written the patient for pain medication and called it into his pharmacy; Dr. Allena Katz recommends broad-spectrum antibiotics with clindamycin or ciprofloxacin  for 14 days until cultures result for specific bug needed and Dr. Allena Katz recommend weightbearing as tolerated and not to do any dressing changes until he was seen in the office -Patient is to ambulate in the cam boot however was having more pain and will need to adjust his pain medication prior to discharge and for that he gets adequate control  Anxiety -Severe anxiety over  upcoming procedure leading to generalized shaking -Started on Ativan 0.5 mg twice daily as needed -Provided reassurance  Hypertension likely related to pain, anxiety possibly hypertensive -Pain and anxiety control -As needed hydralazine -TOC consult, patient needs PCP -Continue to monitor blood pressures per protocol -Last blood pressure reading was 159/100 but he worked with therapy and became lightheaded and dizzy so we will check orthostatic vital signs  Asymptomatic hyperkalemia -K+ was 5.6 on Admission and is now 3.5 -Check EKG  -IV fluids were given and now discontinued; will give a 500 mL bolus again given his dizziness today; Now on NS at 75 mL/hr but will increase rate to 100 mL/h  Hypomagnesemia -Patient's magnesium level was 1.6 -Replete with IV mag sulfate 2 g again -Continue to monitor and replete as necessary -Repeat magnesium level in a.m.  Hyperbilirubinemia -Patient's T bili was 1.6 and is now trending down to 1.2.  Likely reactive -C/w NS at 75 mL/hr but will increase rate to 100 MLS per hour -Continue to monitor and trend and repeat CMP in a.m.  Dizziness and Lightheadedness -Happened when patient started ambulating with physical therapy but had his pain medication about 30 minutes prior -Continue with IV fluid Hydration to 100 MLS per hour and give a 500 mL bolus and check orthostatic a.m. -We will add TED hose on nonsurgical leg -PT OT to reevaluate in the a.m. as patient only ambulated 24 feet but they are recommending home health at discharge  -PT to reevaluate in the a.m. and likely can be discharged tomorrow if he is improved and pain is controlled with Ambulation   Abnormal AST  -Proved and normalized -Patient's AST went from 35 and is now 21 -Continue to monitor and trend -Repeat CMP in a.m.  Metabolic Acidosis -Mild -Patient's CO2 is 26, anion gap is 9, chloride level is 100 -IV fluid hydration had stopped but will resume -Continue to monitor  and trend and repeat CMP in a.m.  Hyperglycemia -Patient's Glucose level on Admission was 115 and 98 this AM on CMP -Check HbA1c in the a.m. -Continue to Monitor Blood Sugars Carefully and if necessary will place on Sensitive Novolog SSI   DVT prophylaxis: Heparin 5,000 units sq q8h Code Status: FULL CODE  Family Communication: No family present at bedside Disposition Plan: Pending Surgical Intervention and Improvement in his Pain  Status is: Inpatient  Remains inpatient appropriate because:Unsafe d/c plan, IV treatments appropriate due to intensity of illness or inability to take PO and Inpatient level of care appropriate due to severity of illness   Dispo: The patient is from: Home              Anticipated d/c is to: TBD              Patient currently is not medically stable to d/c.   Difficult to place patient No  Consultants:   Podiatry Dr. Nicholes Rough   Procedures: IRRIGATION AND DEBRIDEMENT FOOT (Left)   Antimicrobials: Anti-infectives (From admission, onward)   Start     Dose/Rate Route Frequency Ordered Stop   11/25/20 0000  vancomycin (  VANCOREADY) IVPB 750 mg/150 mL        750 mg 150 mL/hr over 60 Minutes Intravenous Every 12 hours 11/24/20 1627     11/24/20 1630  vancomycin (VANCOCIN) IVPB 1000 mg/200 mL premix  Status:  Discontinued        1,000 mg 200 mL/hr over 60 Minutes Intravenous  Once 11/24/20 1616 11/24/20 1619   11/24/20 1515  vancomycin (VANCOCIN) IVPB 1000 mg/200 mL premix        1,000 mg 200 mL/hr over 60 Minutes Intravenous  Once 11/24/20 1500 11/24/20 1621        Subjective: Seen and examined at bedside and he states he was hurting in his foot and states he heard it in his cam boot if bed.  Continues to have significant pain.  No nausea or vomiting. Ambulated with PT but pain was a limiting factor became lightheaded and dizzy.  No other concerns or complaints at this time.  Objective: Vitals:   11/26/20 0742 11/26/20 0747 11/26/20 1051  11/26/20 1230  BP: (!) 179/96 (!) 165/97 (!) 150/92 (!) 159/100  Pulse: (!) 57 61 63 65  Resp:   18   Temp: 98.6 F (37 C)  98 F (36.7 C)   TempSrc: Oral  Oral   SpO2: 100%  98% 98%  Weight:      Height:        Intake/Output Summary (Last 24 hours) at 11/26/2020 1348 Last data filed at 11/26/2020 1344 Gross per 24 hour  Intake 2226.66 ml  Output 2795 ml  Net -568.34 ml   Filed Weights   11/24/20 1729 11/25/20 1332  Weight: 68 kg 68 kg   Examination: Physical Exam:  Constitutional: The patient is a thin anxious Hispanic male in mild distress appears little bit uncomfortable again and in pain Eyes: Lids and conjunctivae normal, sclerae anicteric  ENMT: External Ears, Nose appear normal. Grossly normal hearing. Neck: Appears normal, supple, no cervical masses, normal ROM, no appreciable thyromegaly; no JVD Respiratory: Diminished to auscultation bilaterally, no wheezing, rales, rhonchi or crackles. Normal respiratory effort and patient is not tachypenic. No accessory muscle use.  Unlabored breathing Cardiovascular: RRR, no murmurs / rubs / gallops. S1 and S2 auscultated.  Abdomen: Soft, non-tender, non-distended. Bowel sounds positive.  GU: Deferred. Musculoskeletal: No clubbing / cyanosis of digits/nails.  Left ankle is wrapped in an Ace bandage Skin: No rashes, lesions, ulcers on limited skin evaluation. No induration; Warm and dry.  Neurologic: CN 2-12 grossly intact with no focal deficits. Romberg sign and cerebellar reflexes not assessed.  Psychiatric: Normal judgment and insight. Alert and oriented x 3.  Slightly anxious mood and appropriate affect.   Data Reviewed: I have personally reviewed following labs and imaging studies  CBC: Recent Labs  Lab 11/24/20 1336 11/25/20 0902 11/26/20 0307  WBC 9.2 6.2 5.1  NEUTROABS 7.4 4.4 3.4  HGB 17.5* 14.2 13.8  HCT 51.2 42.5 41.8  MCV 92.4 93.0 95.2  PLT 267 205 211   Basic Metabolic Panel: Recent Labs  Lab  11/24/20 1336 11/25/20 0902 11/26/20 0307  NA 136 135 135  K 5.6* 3.6 3.5  CL 102 100 100  CO2 22 21* 26  GLUCOSE 115* 98 94  BUN CREATININE 0.88 0.72 0.79  CALCIUM 9.9 8.9 8.5*  MG  --  1.6* 1.6*  PHOS  --  2.7 3.5   GFR: Estimated Creatinine Clearance: 88.6 mL/min (by C-G formula based on SCr of 0.79 mg/dL). Liver Function  Tests: Recent Labs  Lab 11/24/20 1336 11/25/20 0902 11/26/20 0307  AST 55* 22 21  ALT 31 20 18   ALKPHOS 110 85 84  BILITOT 1.6* 1.3* 1.2  PROT 8.2* 6.7 6.6  ALBUMIN 4.3 3.6 3.5   No results for input(s): LIPASE, AMYLASE in the last 168 hours. No results for input(s): AMMONIA in the last 168 hours. Coagulation Profile: No results for input(s): INR, PROTIME in the last 168 hours. Cardiac Enzymes: No results for input(s): CKTOTAL, CKMB, CKMBINDEX, TROPONINI in the last 168 hours. BNP (last 3 results) No results for input(s): PROBNP in the last 8760 hours. HbA1C: Recent Labs    11/26/20 0307  HGBA1C 5.4   CBG: No results for input(s): GLUCAP in the last 168 hours. Lipid Profile: No results for input(s): CHOL, HDL, LDLCALC, TRIG, CHOLHDL, LDLDIRECT in the last 72 hours. Thyroid Function Tests: No results for input(s): TSH, T4TOTAL, FREET4, T3FREE, THYROIDAB in the last 72 hours. Anemia Panel: No results for input(s): VITAMINB12, FOLATE, FERRITIN, TIBC, IRON, RETICCTPCT in the last 72 hours. Sepsis Labs: Recent Labs  Lab 11/24/20 1707  LATICACIDVEN 1.6    Recent Results (from the past 240 hour(s))  Resp Panel by RT-PCR (Flu A&B, Covid) Nasopharyngeal Swab     Status: None   Collection Time: 11/24/20  3:12 PM   Specimen: Nasopharyngeal Swab; Nasopharyngeal(NP) swabs in vial transport medium  Result Value Ref Range Status   SARS Coronavirus 2 by RT PCR NEGATIVE NEGATIVE Final    Comment: (NOTE) SARS-CoV-2 target nucleic acids are NOT DETECTED.  The SARS-CoV-2 RNA is generally detectable in upper respiratory specimens during  the acute phase of infection. The lowest concentration of SARS-CoV-2 viral copies this assay can detect is 138 copies/mL. A negative result does not preclude SARS-Cov-2 infection and should not be used as the sole basis for treatment or other patient management decisions. A negative result may occur with  improper specimen collection/handling, submission of specimen other than nasopharyngeal swab, presence of viral mutation(s) within the areas targeted by this assay, and inadequate number of viral copies(<138 copies/mL). A negative result must be combined with clinical observations, patient history, and epidemiological information. The expected result is Negative.  Fact Sheet for Patients:  BloggerCourse.comhttps://www.fda.gov/media/152166/download  Fact Sheet for Healthcare Providers:  SeriousBroker.ithttps://www.fda.gov/media/152162/download  This test is no t yet approved or cleared by the [place] FDA and  has been authorized for detection and/or diagnosis of SARS-CoV-2 by FDA under an Emergency Use Authorization (EUA). This EUA will remain  in effect (meaning this test can be used) for the duration of the COVID-19 declaration under Section 564(b)(1) of the Act, 21 U.S.C.section 360bbb-3(b)(1), unless the authorization is terminated  or revoked sooner.       Influenza A by PCR NEGATIVE NEGATIVE Final   Influenza B by PCR NEGATIVE NEGATIVE Final    Comment: (NOTE) The Xpert Xpress SARS-CoV-2/FLU/RSV plus assay is intended as an aid in the diagnosis of influenza from Nasopharyngeal swab specimens and should not be used as a sole basis for treatment. Nasal washings and aspirates are unacceptable for Xpert Xpress SARS-CoV-2/FLU/RSV testing.  Fact Sheet for Patients: BloggerCourse.comhttps://www.fda.gov/media/152166/download  Fact Sheet for Healthcare Providers: SeriousBroker.ithttps://www.fda.gov/media/152162/download  This test is not yet approved or cleared by the [place] FDA and has been authorized for detection and/or  diagnosis of SARS-CoV-2 by FDA under an Emergency Use Authorization (EUA). This EUA will remain in effect (meaning this test can be used) for the duration of the COVID-19 declaration under Section 564(b)(1)  of the Act, 21 U.S.C. section 360bbb-3(b)(1), unless the authorization is terminated or revoked.  Performed at [facility], [address] [place], [place] [address]   Aerobic/Anaerobic Culture w Gram Stain (surgical/deep wound)     Status: None (Preliminary result)   Collection Time: 11/25/20  2:21 PM   Specimen: PATH Cytology Misc. fluid; Body Fluid  Result Value Ref Range Status   Specimen Description   Final    ABSCESS  LEFT FOOT Performed at [facility], [address] [place], [place] [address]    Special Requests   Final    NONE Performed at [facility], [address], [place], [place] [address]    Gram Stain PENDING  Incomplete   Culture   Final    NO GROWTH < 12 HOURS Performed at [facility], [address] [place], [place] [address]    Report Status PENDING  Incomplete     RN Pressure Injury Documentation:     Estimated body mass index is 24.2 kg/m as calculated from the following:   Height as of this encounter: 5\' 6"  (1.676 m).   Weight as of this encounter: 68 kg.  Malnutrition Type:   Malnutrition Characteristics:   Nutrition Interventions:    Radiology Studies: DG Ankle Complete Left  Result Date: 11/24/2020 CLINICAL DATA:  Pain with reported recent drainage of lesion from the medial left ankle region EXAM: LEFT ANKLE COMPLETE - 3+ VIEW COMPARISON:  None. FINDINGS: Frontal, oblique, and lateral views were obtained. No evident fracture or joint effusion. Joint spaces appear normal. No erosive change or bony destruction. There is no soft tissue air. There are multiple foci of arterial vascular calcification. IMPRESSION: No fracture or erosion. No bony destruction. No arthropathy  appreciable. Ankle mortise appears intact. Multiple foci of arterial vascular calcification noted. Question underlying diabetes mellitus. Electronically Signed   By: 11/26/2020 III M.D.   On: 11/24/2020 14:06   DG Foot Complete Left  Result Date: 11/24/2020 CLINICAL DATA:  Pain EXAM: LEFT FOOT - COMPLETE 3+ VIEW COMPARISON:  None. FINDINGS: Frontal, oblique, and lateral views were obtained. There is no fracture or dislocation. There is moderate narrowing in the first MTP joint with spurring in this area. Calcification is noted in the first IP joint without appreciable joint space narrowing. No erosive changes or bony destruction. There are multiple foci of arterial vascular calcification. No evidence soft tissue air. IMPRESSION: Osteoarthritic change first MTP joint. Calcification in the first IP joint is likely of arthropathic etiology. No fracture or dislocation. No bony destruction or erosion. Extensive arterial vascular calcification noted. Electronically Signed   By: 11/26/2020 III M.D.   On: 11/24/2020 14:04   Scheduled Meds: . heparin  5,000 Units Subcutaneous Q8H   Continuous Infusions: . sodium chloride 75 mL/hr at 11/26/20 0600  . magnesium sulfate bolus IVPB    . sodium chloride    . vancomycin 750 mg (11/26/20 1251)    LOS: 1 day   11/28/20, DO [facility] PAGER is on AMION  If 7PM-7AM, please contact night-coverage [email or web address]

## 2020-11-27 ENCOUNTER — Other Ambulatory Visit: Payer: Self-pay | Admitting: Podiatry

## 2020-11-27 DIAGNOSIS — R030 Elevated blood-pressure reading, without diagnosis of hypertension: Secondary | ICD-10-CM

## 2020-11-27 DIAGNOSIS — L02612 Cutaneous abscess of left foot: Secondary | ICD-10-CM

## 2020-11-27 LAB — CBC WITH DIFFERENTIAL/PLATELET
Abs Immature Granulocytes: 0.03 10*3/uL (ref 0.00–0.07)
Basophils Absolute: 0 10*3/uL (ref 0.0–0.1)
Basophils Relative: 0 %
Eosinophils Absolute: 0.1 10*3/uL (ref 0.0–0.5)
Eosinophils Relative: 2 %
HCT: 41.6 % (ref 39.0–52.0)
Hemoglobin: 13.6 g/dL (ref 13.0–17.0)
Immature Granulocytes: 1 %
Lymphocytes Relative: 24 %
Lymphs Abs: 1.3 10*3/uL (ref 0.7–4.0)
MCH: 31.2 pg (ref 26.0–34.0)
MCHC: 32.7 g/dL (ref 30.0–36.0)
MCV: 95.4 fL (ref 80.0–100.0)
Monocytes Absolute: 0.5 10*3/uL (ref 0.1–1.0)
Monocytes Relative: 10 %
Neutro Abs: 3.4 10*3/uL (ref 1.7–7.7)
Neutrophils Relative %: 63 %
Platelets: 223 10*3/uL (ref 150–400)
RBC: 4.36 MIL/uL (ref 4.22–5.81)
RDW: 13 % (ref 11.5–15.5)
WBC: 5.4 10*3/uL (ref 4.0–10.5)
nRBC: 0 % (ref 0.0–0.2)

## 2020-11-27 LAB — COMPREHENSIVE METABOLIC PANEL
ALT: 19 U/L (ref 0–44)
AST: 22 U/L (ref 15–41)
Albumin: 3.4 g/dL — ABNORMAL LOW (ref 3.5–5.0)
Alkaline Phosphatase: 77 U/L (ref 38–126)
Anion gap: 10 (ref 5–15)
BUN: 14 mg/dL (ref 6–20)
CO2: 21 mmol/L — ABNORMAL LOW (ref 22–32)
Calcium: 8.7 mg/dL — ABNORMAL LOW (ref 8.9–10.3)
Chloride: 105 mmol/L (ref 98–111)
Creatinine, Ser: 0.78 mg/dL (ref 0.61–1.24)
GFR, Estimated: >60 mL/min (ref >60)
Glucose, Bld: 88 mg/dL (ref 70–99)
Potassium: 4 mmol/L (ref 3.5–5.1)
Sodium: 136 mmol/L (ref 135–145)
Total Bilirubin: 1 mg/dL (ref 0.3–1.2)
Total Protein: 6.5 g/dL (ref 6.5–8.1)

## 2020-11-27 LAB — PHOSPHORUS: Phosphorus: 2.3 mg/dL — ABNORMAL LOW (ref 2.5–4.6)

## 2020-11-27 LAB — MAGNESIUM: Magnesium: 1.9 mg/dL (ref 1.7–2.4)

## 2020-11-27 MED ORDER — AMLODIPINE BESYLATE 5 MG PO TABS
5.0000 mg | ORAL_TABLET | Freq: Every day | ORAL | Status: DC
  Administered 2020-11-27 – 2020-11-28 (×2): 5 mg via ORAL
  Filled 2020-11-27 (×2): qty 1

## 2020-11-27 MED ORDER — ENOXAPARIN SODIUM 40 MG/0.4ML ~~LOC~~ SOLN
40.0000 mg | SUBCUTANEOUS | Status: DC
  Administered 2020-11-27: 40 mg via SUBCUTANEOUS
  Filled 2020-11-27: qty 0.4

## 2020-11-27 NOTE — TOC Progression Note (Signed)
Transition of Care [facility]) - Progression Note   Patient Details  Name: Willie Collins MRN: [id number] Date of Birth: 1960-10-08  Transition of [facility]) CM/SW Contact  Ewing Schlein, LCSW Phone Number: 11/27/2020, 2:13 PM  Clinical Narrative: DME orders for charity 3N1 and rolling walker placed. CSW updated Zach with [employer]. [employer] to deliver DME to patient's room. CSW followed up with Kathlene November from [employer] as it is their charity week for [facility]. Per Clydene Pugh unable to provide HHPT due staffing and will not have availability for 7-10 days. CSW notified TOC supervisor and was instructed to offer an LOG for HHPT to another [facility] agency. CSW made referral to [place] with [facility]urbish and was declined. CSW made referral to [facility] with [facility] and was declined. CSW made referral to [place] with [facility] and was informed [facility] does not accept LOGs.  Expected Discharge Plan: Home w Home Health Services Barriers to Discharge: Continued Medical Work up,No Home Care Agency will accept this patient  Expected Discharge Plan and Services Expected Discharge Plan: Home w Home Health Services In-house Referral: Clinical Social Work Post Acute Care Choice: Durable Medical Equipment,Home Health Living arrangements for the past 2 months: Single Family Home              DME Arranged: Dan Humphreys rolling,3-N-1 Date DME Agency Contacted: 11/26/20 Representative spoke with at DME Agency: Ian Malkin  Readmission Risk Interventions No flowsheet data found.

## 2020-11-27 NOTE — Progress Notes (Signed)
PT Cancellation Note  Patient Details Name: Willie Collins MRN: [id number] DOB: June 07, 1961   Cancelled Treatment:    Reason Eval/Treat Not Completed: Pain limiting ability to participate (pt is due for pain medication in about 15 minutes. He requested PT attempt after he receives this. Will return later today.)   Tamala Ser PT 11/27/2020  Acute Rehabilitation Services Pager [phone] Office [phone]

## 2020-11-27 NOTE — Progress Notes (Signed)
Pharmacy Antibiotic Note  Willie Collins is a 60 y.o. male admitted on 11/24/2020 with L foot cellulitis. He is s/p drainage and a course of PO bactrim as an outpatient.  Pharmacy has been consulted for vancomycin dosing. He is s/p ID 3/14, cultures pending.  Day #4 Vancomycin  - afebrile - WBC wnl - SCr 0.78 (stable), CrCl 88 ml/min  Plan: Vancomcyin 1g IV given 3/13 in ED, continue with 750mg  IV q12h for estimated AUC 429 using SCr 0.88 Consider checking vancomycin levels in the next 48hr if not discharged by then Follow up renal function & cultures  Height: 5\' 6"  (167.6 cm) Weight: 68 kg (149 lb 14.6 oz) IBW/kg (Calculated) : 63.8  Temp (24hrs), Avg:98.2 F (36.8 C), Min:98 F (36.7 C), Max:98.6 F (37 C)  Recent Labs  Lab 11/24/20 1336 11/24/20 1707 11/25/20 0902 11/26/20 0307 11/27/20 0320  WBC 9.2  --  6.2 5.1 5.4  CREATININE 0.88  --  0.72 0.79 0.78  LATICACIDVEN  --  1.6  --   --   --     Estimated Creatinine Clearance: 88.6 mL/min (by C-G formula based on SCr of 0.78 mg/dL).    No Known Allergies  Antimicrobials this admission: 3/13 Vancomycin >>  Dose adjustments this admission:  Microbiology results: 3/13 COVID/Flu: neg/neg 3/14 L foot abscess: ngtd  Thank you for allowing pharmacy to be a part of this patients care.  4/13, PharmD, BCPS Pharmacy: [phone] 11/27/2020 10:59 AM

## 2020-11-27 NOTE — Progress Notes (Signed)
Physical Therapy Treatment Patient Details Name: Willie Collins MRN: [id number] DOB: [birth year] Today's Date: 11/27/2020    History of Present Illness 60 year old male with past medical history of anxiety, depression, gout that presents to the ED with left foot pain. s/p I &D L foot on 11/25/20.    PT Comments    Ortho tech fitted and issued pt post op shoe at start of PT session. Pt reported post op shoe was much more comfortable than the boot. He was able to ambulate 100' with RW, without dizziness today. Assessed orthostatics (see flowsheets), which were negative. Stair training completed. Significant improvement in activity tolerance today.  Pt is ready to DC home from PT standpoint. Pt stated he's not sure he'll be able to afford prescription medication, RN notified.    Follow Up Recommendations  Home health PT     Equipment Recommendations  Rolling walker with 5" wheels    Recommendations for Other Services       Precautions / Restrictions Precautions Precautions: Fall Required Braces or Orthoses: Other Brace Other Brace: LT post op shoe Restrictions Weight Bearing Restrictions: No Other Position/Activity Restrictions: WBAT    Mobility  Bed Mobility Overal bed mobility: Modified Independent                  Transfers Overall transfer level: Needs assistance   Transfers: Sit to/from Stand Sit to Stand: Supervision         General transfer comment: VCs hand placement  Ambulation/Gait Ambulation/Gait assistance: Supervision Gait Distance (Feet): 100 Feet Assistive device: Rolling walker (2 wheeled) Gait Pattern/deviations: Step-to pattern;Decreased step length - right;Decreased step length - left Gait velocity: decr   General Gait Details: steady, no loss of balance, pt denied dizziness   Stairs Stairs: Yes Stairs assistance: Supervision Stair Management: One rail Right;Forwards;Step to pattern Number of Stairs: 3 General stair comments: VCs  sequencing, supervision for safety   Wheelchair Mobility    Modified Rankin (Stroke Patients Only)       Balance Overall balance assessment: Modified Independent                                          Cognition Arousal/Alertness: (P) Awake/alert Behavior During Therapy: (P) WFL for tasks assessed/performed Overall Cognitive Status: (P) Within Functional Limits for tasks assessed                                        Exercises      General Comments        Pertinent Vitals/Pain Pain Score: 4  Pain Location: LT foot at rest Pain Descriptors / Indicators: Sore Pain Intervention(s): Limited activity within patient's tolerance;Monitored during session;Premedicated before session;Repositioned    Home Living                      Prior Function            PT Goals (current goals can now be found in the care plan section) Acute Rehab PT Goals Patient Stated Goal: to return to work at [employer] agency PT Goal Formulation: With patient Time For Goal Achievement: 12/10/20 Potential to Achieve Goals: Good Progress towards PT goals: Progressing toward goals    Frequency    Min 5X/week      PT Plan Current  plan remains appropriate    Co-evaluation              AM-PAC PT "6 Clicks" Mobility   Outcome Measure  Help needed turning from your back to your side while in a flat bed without using bedrails?: A Little Help needed moving from lying on your back to sitting on the side of a flat bed without using bedrails?: A Little Help needed moving to and from a bed to a chair (including a wheelchair)?: None Help needed standing up from a chair using your arms (e.g., wheelchair or bedside chair)?: None Help needed to walk in hospital room?: None Help needed climbing 3-5 steps with a railing? : A Little 6 Click Score: 21    End of Session Equipment Utilized During Treatment: Gait belt Activity Tolerance: Patient limited by  pain Patient left: in chair;with call bell/phone within reach Nurse Communication: Mobility status PT Visit Diagnosis: Difficulty in walking, not elsewhere classified (R26.2);Pain Pain - Right/Left: Left Pain - part of body: Ankle and joints of foot     Time: 1459-1530 PT Time Calculation (min) (ACUTE ONLY): 31 min  Charges:  $Gait Training: 8-22 mins $Therapeutic Activity: 8-22 mins                     Ralene Bathe Kistler PT 11/27/2020  Acute Rehabilitation Services Pager [phone] Office [phone]

## 2020-11-27 NOTE — Progress Notes (Signed)
PROGRESS NOTE    Willie Collins  ATF:[id number] DOB: 02/09/1961 DOA: 11/24/2020 PCP: Patient, No Pcp Per   Brief Narrative: Alen Matheson is a 60 y.o. male with a history of anxiety, depression, gout. Patient presented secondary to left foot pain in setting of worsened left foot abscess that did not respond to ED I&D and Bactrim treatment. Vancomycin IV started and podiatry consulted for repeat I&D.   Assessment & Plan:   Principal Problem:   Foot abscess, left Active Problems:   Anxiety   Hyperkalemia   Left foot abscess Patient with POA I&D at outside hospital ED with subsequent antibiotic failure (Bactrim DS); no culture data appears to be available. On this admission, empiric Vancomycin initiated, podiatry was consulted and patient underwent I&D in the OR on 3/14. Abscess cultures pending. -Continue Vancomycin IV for now pending culture data -Podiatry recommendations: weight bearing as tolerated in CAM boot/surgical shoe, outpatient follow-up in 1 week for dressing change -PT/OT recommendations: 3 in 1 commode, rolling walker, home health PT  Elevated blood pressure No history of hypertension but patient has been significantly hypertensive while admitted. -Start amlodipine 5 mg daily -PCP follow-up  Anxiety Situational and in setting of planned I&D.   Hyperkalemia Mild. Resolved.  Hypomagnesemia Mild. Resolved.  Hyperbilirubinemia Transient. Resolved.  Lightheadedness In setting of ambulation with PT. Possibly orthostatic, however, no orthostatic vitals performed. Patient given a NS bolus at the time. PT planning to work with him today.  Elevated AST Minimal. Resolved.  Metabolic acidosis Mild. Outpatient follow-up.  Hyperglycemia Negligible.   DVT prophylaxis: Lovenox Code Status:   Code Status: Full Code Family Communication: None at bedside Disposition Plan: Discharge home likely in 24 hours pending abscess culture data   Consultants:    Podiatry  Procedures:   IRRIGATION AND DEBRIDEMENT of LEFT FOOT (11/25/2020)  Antimicrobials:  Vancomycin IV    Subjective: Significant foot pain. No other concerns.  Objective: Vitals:   11/26/20 1605 11/26/20 1824 11/26/20 2049 11/27/20 0520  BP: (!) 173/99 (!) 191/95 (!) 166/95 (!) 160/84  Pulse: 77 67 60 69  Resp:  18 16 16   Temp:  98.1 F (36.7 C) 98 F (36.7 C) 98.6 F (37 C)  TempSrc:  Oral Oral   SpO2: 98% 98% 98% 98%  Weight:      Height:        Intake/Output Summary (Last 24 hours) at 11/27/2020 1053 Last data filed at 11/27/2020 0620 Gross per 24 hour  Intake 2848.37 ml  Output 3780 ml  Net -931.63 ml   Filed Weights   11/24/20 1729 11/25/20 1332  Weight: 68 kg 68 kg    Examination:  General exam: Appears calm and comfortable Respiratory system: Clear to auscultation. Respiratory effort normal. Cardiovascular system: S1 & S2 heard, RRR. No murmurs, rubs, gallops or clicks. Gastrointestinal system: Abdomen is nondistended, soft and nontender. No organomegaly or masses felt. Normal bowel sounds heard. Central nervous system: Alert and oriented. No focal neurological deficits. Musculoskeletal: No edema. No calf tenderness. Left foot is bandaged in ACE bandages Skin: No cyanosis. No rashes Psychiatry: Judgement and insight appear normal. Mood & affect appropriate.     Data Reviewed: I have personally reviewed following labs and imaging studies  CBC Lab Results  Component Value Date   WBC 5.4 11/27/2020   RBC 4.36 11/27/2020   HGB 13.6 11/27/2020   HCT 41.6 11/27/2020   MCV 95.4 11/27/2020   MCH 31.2 11/27/2020   PLT 223 11/27/2020   MCHC 32.7  11/27/2020   RDW 13.0 11/27/2020   LYMPHSABS 1.3 11/27/2020   MONOABS 0.5 11/27/2020   EOSABS 0.1 11/27/2020   BASOSABS 0.0 11/27/2020     Last metabolic panel Lab Results  Component Value Date   NA 136 11/27/2020   K 4.0 11/27/2020   CL 105 11/27/2020   CO2 21 (L) 11/27/2020   BUN 14  11/27/2020   CREATININE 0.78 11/27/2020   GLUCOSE 88 11/27/2020   GFRNONAA >60 11/27/2020   CALCIUM 8.7 (L) 11/27/2020   PHOS 2.3 (L) 11/27/2020   PROT 6.5 11/27/2020   ALBUMIN 3.4 (L) 11/27/2020   BILITOT 1.0 11/27/2020   ALKPHOS 77 11/27/2020   AST 22 11/27/2020   ALT 19 11/27/2020   ANIONGAP 10 11/27/2020    CBG (last 3)  No results for input(s): GLUCAP in the last 72 hours.   GFR: Estimated Creatinine Clearance: 88.6 mL/min (by C-G formula based on SCr of 0.78 mg/dL).  Coagulation Profile: No results for input(s): INR, PROTIME in the last 168 hours.  Recent Results (from the past 240 hour(s))  Resp Panel by RT-PCR (Flu A&B, Covid) Nasopharyngeal Swab     Status: None   Collection Time: 11/24/20  3:12 PM   Specimen: Nasopharyngeal Swab; Nasopharyngeal(NP) swabs in vial transport medium  Result Value Ref Range Status   SARS Coronavirus 2 by RT PCR NEGATIVE NEGATIVE Final    Comment: (NOTE) SARS-CoV-2 target nucleic acids are NOT DETECTED.  The SARS-CoV-2 RNA is generally detectable in upper respiratory specimens during the acute phase of infection. The lowest concentration of SARS-CoV-2 viral copies this assay can detect is 138 copies/mL. A negative result does not preclude SARS-Cov-2 infection and should not be used as the sole basis for treatment or other patient management decisions. A negative result may occur with  improper specimen collection/handling, submission of specimen other than nasopharyngeal swab, presence of viral mutation(s) within the areas targeted by this assay, and inadequate number of viral copies(<138 copies/mL). A negative result must be combined with clinical observations, patient history, and epidemiological information. The expected result is Negative.  Fact Sheet for Patients:  BloggerCourse.com  Fact Sheet for Healthcare Providers:  SeriousBroker.it  This test is no t yet approved or  cleared by the [place] FDA and  has been authorized for detection and/or diagnosis of SARS-CoV-2 by FDA under an Emergency Use Authorization (EUA). This EUA will remain  in effect (meaning this test can be used) for the duration of the COVID-19 declaration under Section 564(b)(1) of the Act, 21 U.S.C.section 360bbb-3(b)(1), unless the authorization is terminated  or revoked sooner.       Influenza A by PCR NEGATIVE NEGATIVE Final   Influenza B by PCR NEGATIVE NEGATIVE Final    Comment: (NOTE) The Xpert Xpress SARS-CoV-2/FLU/RSV plus assay is intended as an aid in the diagnosis of influenza from Nasopharyngeal swab specimens and should not be used as a sole basis for treatment. Nasal washings and aspirates are unacceptable for Xpert Xpress SARS-CoV-2/FLU/RSV testing.  Fact Sheet for Patients: BloggerCourse.com  Fact Sheet for Healthcare Providers: SeriousBroker.it  This test is not yet approved or cleared by the [place] FDA and has been authorized for detection and/or diagnosis of SARS-CoV-2 by FDA under an Emergency Use Authorization (EUA). This EUA will remain in effect (meaning this test can be used) for the duration of the COVID-19 declaration under Section 564(b)(1) of the Act, 21 U.S.C. section 360bbb-3(b)(1), unless the authorization is terminated or revoked.  Performed at [facility]  [facility], [address] [place], [place] [address]   Aerobic/Anaerobic Culture w Gram Stain (surgical/deep wound)     Status: None (Preliminary result)   Collection Time: 11/25/20  2:21 PM   Specimen: PATH Cytology Misc. fluid; Body Fluid  Result Value Ref Range Status   Specimen Description   Final    ABSCESS  LEFT FOOT Performed at [facility], [address], [place], [place] [address]    Special Requests   Final    NONE Performed at [facility], [address],  [place], [place] [address]    Gram Stain   Final    FEW WBC PRESENT, PREDOMINANTLY MONONUCLEAR FEW GRAM POSITIVE COCCI    Culture   Final    NO GROWTH < 12 HOURS Performed at [facility], [address] [place], [place] [address]    Report Status PENDING  Incomplete        Radiology Studies: No results found.      Scheduled Meds: . heparin  5,000 Units Subcutaneous Q8H   Continuous Infusions: . sodium chloride 100 mL/hr at 11/27/20 0612  . magnesium sulfate bolus IVPB    . vancomycin Stopped (11/27/20 0824)     LOS: 2 days     Jacquelin Hawking, MD [facility] 11/27/2020, 10:53 AM  If 7PM-7AM, please contact night-coverage [email or web address]

## 2020-11-27 NOTE — Progress Notes (Signed)
Orthopedic Tech Progress Note Patient Details:  Willie Collins March 31, 1961 [id number]  Ortho Devices Type of Ortho Device: Postop shoe/boot Ortho Device/Splint Location: left Post op shoe ordered by Physical therapist Ortho Device/Splint Interventions: Application   Post Interventions Patient Tolerated: Well Instructions Provided: Care of device   Saul Fordyce 11/27/2020, 3:33 PM

## 2020-11-27 NOTE — Progress Notes (Signed)
  Subjective:  Patient ID: Willie Collins, male    DOB: Oct 20, 1960,  MRN: [id number]  A 60 y.o. male presents with left foot abscess status post incision and drainage washout debridement with primary closure.  Patient states he is doing well.  Minimal pain.  He has had a hard time difficulty ambulating with the boot on with physical therapy.  He would like to know if there is anything else that he could wear.  He denies any other acute complaints.  No nausea fever chills vomiting. Objective:   Vitals:   11/26/20 2049 11/27/20 0520  BP: (!) 166/95 (!) 160/84  Pulse: 60 69  Resp: 16 16  Temp: 98 F (36.7 C) 98.6 F (37 C)  SpO2: 98% 98%   General AA&O x3. Normal mood and affect.  Vascular Dorsalis pedis and posterior tibial pulses 2/4 bilat. Brisk capillary refill to all digits. Pedal hair present.  Neurologic Epicritic sensation grossly intact.  Dermatologic  bandages clean dry and intact.  I do not plan to remove the bandage as it was placed on sterilely..  Orthopedic: MMT 5/5 in dorsiflexion, plantarflexion, inversion, and eversion. Normal joint ROM without pain or crepitus.    Assessment & Plan:  Patient was evaluated and treated and all questions answered.  Left foot abscess status post incision and drainage washout debridement -All questions and concerns were answered. -I discussed with the patient that he is okay to be discharged from my standpoint on p.o. antibiotics based on cultures. -He can be weightbearing as tolerated in a cam boot or a surgical shoe. -I will discuss with physical therapy to see if he can ambulate better with a surgical shoe as opposed to a cam boot. -No local wound care needed at this time.  I will primarily change the bandage in clinic next week. -He can follow-up in our clinic 1 week from discharge  Candelaria Stagers, DPM  Accessible via secure chat for questions or concerns.

## 2020-11-28 MED ORDER — AMOXICILLIN-POT CLAVULANATE 500-125 MG PO TABS
1.0000 | ORAL_TABLET | Freq: Three times a day (TID) | ORAL | 0 refills | Status: AC
Start: 2020-11-28 — End: 2020-12-01

## 2020-11-28 MED ORDER — AMLODIPINE BESYLATE 5 MG PO TABS: 5.0000 mg | ORAL_TABLET | Freq: Every day | ORAL | 0 refills | Status: DC

## 2020-11-28 MED ORDER — DOXYCYCLINE HYCLATE 100 MG PO TABS: 100.0000 mg | ORAL_TABLET | Freq: Two times a day (BID) | ORAL | 0 refills | Status: AC

## 2020-11-28 MED ORDER — DOXYCYCLINE HYCLATE 100 MG PO TABS
100.0000 mg | ORAL_TABLET | Freq: Two times a day (BID) | ORAL | Status: DC
  Administered 2020-11-28: 100 mg via ORAL
  Filled 2020-11-28 (×2): qty 1

## 2020-11-28 MED ORDER — AMOXICILLIN-POT CLAVULANATE 500-125 MG PO TABS
1.0000 | ORAL_TABLET | Freq: Three times a day (TID) | ORAL | Status: DC
  Administered 2020-11-28 (×2): 500 mg via ORAL
  Filled 2020-11-28 (×3): qty 1

## 2020-11-28 NOTE — Discharge Summary (Signed)
Physician Discharge Summary  Willie Collins VQX:[id number] DOB: [birth year] DOA: 11/24/2020  PCP: Patient, No Pcp Per  Admit date: 11/24/2020 Discharge date: 11/28/2020  Admitted From: Home Disposition: Home  Recommendations for Outpatient Follow-up:  1. Follow up with PCP in 1 week 2. Follow up with podiatry in 1 week 3. Please follow up on the following pending results: Wound culture final  Home Health: PT Equipment/Devices: 3 in 1 commode, rolling walker  Discharge Condition: Stable CODE STATUS: Full code Diet recommendation: Heart healthy   Brief/Interim Summary:  Admission HPI written by Whitney Post, MD   HPI:  This is a 60 year old male with past medical history of anxiety, depression, gout that presents to the ED with left foot pain.  Patient was recently [facility] for left foot pain and swelling with onset 2 weeks ago at which time he had negative plain films but was noted to have a significant abscess on the medial aspect of his left foot.  He had an I&D and was discharged with 5 days of Bactrim.  He has since completed the Bactrim however because of his severe pain he was taking 3 pills daily as opposed to the prescribed 2 pills and has been taking ibuprofen for pain which has not been helping.  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  He has severe anxiety over having an I&D again due to pain from last time and he is shaking from his anxiety (not chills).  Denies any trauma/injury to the area in the past.  Denies fever, chills or any other complaints.  Of note, he recently moved to the area a few months ago [place] and does not have a PCP.   Hospital course:  Left foot abscess Patient with POA I&D at outside hospital ED with subsequent antibiotic failure (Bactrim DS); no culture data appears to be available. On this admission,  empiric Vancomycin initiated, podiatry was consulted and patient underwent I&D in the OR on 3/14. Abscess cultures with normal flora. Patient transitioned from Vancomycin IV to doxycycline 100 mg BID and Augmentin 500-125 mg TID on discharge to complete a 7 day course of antibiotics. Podiatry recommending outpatient follow-up in 1 week for wound check.  Elevated blood pressure No history of hypertension but patient has been significantly hypertensive while admitted. Started on amlodipine 5 mg daily.   Anxiety Situational and in setting of planned I&D.   Hyperkalemia Mild. Resolved.  Hypomagnesemia Mild. Resolved.  Hyperbilirubinemia Transient. Resolved.  Lightheadedness In setting of ambulation with PT. Possibly orthostatic, however, no orthostatic vitals performed. Patient given a NS bolus at the time. PT planning to work with him today.  Elevated AST Minimal. Resolved.  Metabolic acidosis Mild. Outpatient follow-up.  Hyperglycemia Negligible.  Discharge Diagnoses:  Principal Problem:   Foot abscess, left Active Problems:   Anxiety   Hyperkalemia    Discharge Instructions   Allergies as of 11/28/2020   No Known Allergies     Medication List    STOP taking these medications   pantoprazole 20 MG tablet Commonly known as: PROTONIX   sucralfate 1 g tablet Commonly known as: Carafate   sulfamethoxazole-trimethoprim 800-160 MG tablet Commonly known as: BACTRIM DS     TAKE these medications   amLODipine 5 MG tablet Commonly known as: NORVASC Take 1 tablet (5 mg total) by mouth daily. Start taking on: November 29, 2020   amoxicillin-clavulanate 500-125 MG tablet Commonly known as: AUGMENTIN Take 1 tablet (500 mg total) by mouth 3 (three) times daily for 3 days.   doxycycline 100 MG tablet Commonly known as: VIBRA-TABS Take 1 tablet (100 mg total) by mouth every 12 (twelve) hours for 3 days.   indomethacin 50 MG capsule Commonly known as:  INDOCIN Take 50 mg by mouth daily as needed (gout).   oxyCODONE-acetaminophen 5-325 MG tablet Commonly known as: Percocet Take 1-2 tablets by mouth every 4 (four) hours as needed for severe pain.            Durable Medical Equipment  (From admission, onward)         Start     Ordered   11/27/20 1249  For home use only DME 3 n 1  Once        11/27/20 1248   11/27/20 1249  For home use only DME Walker rolling  Once       Question Answer Comment  Walker: With 5 Inch Wheels   Patient needs a walker to treat with the following condition Abscess of left foot      11/27/20 1248          Follow-up Information    [facility] Follow up on 01/01/2021.   Why: Your appointment is on January 01, 2021 at 1:50pm. You are on a waitlist as well and they will call you if an earlier appointment becomes available. Contact information: [address] [place] [address] [phone]             No Known Allergies  Consultations:  Podiatry   Procedures/Studies: DG Ankle Complete Left  Result Date: 11/24/2020 CLINICAL DATA:  Pain with reported recent drainage of lesion from the medial left ankle region EXAM: LEFT ANKLE COMPLETE - 3+ VIEW COMPARISON:  None. FINDINGS: Frontal, oblique, and lateral views were obtained. No evident fracture or joint effusion. Joint spaces appear normal. No erosive change or bony destruction. There is no soft tissue air. There are multiple foci of arterial vascular calcification. IMPRESSION: No fracture or erosion. No bony destruction. No arthropathy appreciable. Ankle mortise appears intact. Multiple foci of arterial vascular calcification noted. Question underlying diabetes mellitus. Electronically Signed   By: Bretta Bang III M.D.   On: 11/24/2020 14:06   DG Foot Complete Left  Result Date: 11/24/2020 CLINICAL DATA:  Pain EXAM: LEFT FOOT - COMPLETE 3+ VIEW COMPARISON:  None. FINDINGS: Frontal,  oblique, and lateral views were obtained. There is no fracture or dislocation. There is moderate narrowing in the first MTP joint with spurring in this area. Calcification is noted in the first IP joint without appreciable joint space narrowing. No erosive changes or bony destruction. There are multiple foci of arterial vascular calcification. No evidence soft tissue air. IMPRESSION: Osteoarthritic change first MTP joint. Calcification in the first IP joint is likely of arthropathic etiology. No fracture or dislocation. No bony destruction or erosion. Extensive arterial vascular calcification noted. Electronically Signed   By: Bretta Bang III M.D.   On: 11/24/2020 14:04      Subjective: Pain is improved. Was able to ambulate better today.  Discharge Exam: Vitals:   11/28/20 0853 11/28/20 0948  BP: 134/77 (!) 144/86  Pulse: 61   Resp: 18   Temp: 98.2 F (36.8 C)   SpO2: 97%    Vitals:   11/27/20 2008 11/28/20 0634 11/28/20 0853 11/28/20 0948  BP: 140/87 Marland Kitchen)  157/89 134/77 (!) 144/86  Pulse: 67 62 61   Resp: 16 17 18    Temp: 98.4 F (36.9 C) 98.1 F (36.7 C) 98.2 F (36.8 C)   TempSrc: Oral Oral Oral   SpO2: 98% 98% 97%   Weight:      Height:        General: Pt is alert, awake, not in acute distress Cardiovascular: RRR, S1/S2 +, no rubs, no gallops Respiratory: CTA bilaterally, no wheezing, no rhonchi Abdominal: Soft, NT, ND, bowel sounds + Extremities: no edema, no cyanosis    The results of significant diagnostics from this hospitalization (including imaging, microbiology, ancillary and laboratory) are listed below for reference.     Microbiology: Recent Results (from the past 240 hour(s))  Resp Panel by RT-PCR (Flu A&B, Covid) Nasopharyngeal Swab     Status: None   Collection Time: 11/24/20  3:12 PM   Specimen: Nasopharyngeal Swab; Nasopharyngeal(NP) swabs in vial transport medium  Result Value Ref Range Status   SARS Coronavirus 2 by RT PCR NEGATIVE NEGATIVE  Final    Comment: (NOTE) SARS-CoV-2 target nucleic acids are NOT DETECTED.  The SARS-CoV-2 RNA is generally detectable in upper respiratory specimens during the acute phase of infection. The lowest concentration of SARS-CoV-2 viral copies this assay can detect is 138 copies/mL. A negative result does not preclude SARS-Cov-2 infection and should not be used as the sole basis for treatment or other patient management decisions. A negative result may occur with  improper specimen collection/handling, submission of specimen other than nasopharyngeal swab, presence of viral mutation(s) within the areas targeted by this assay, and inadequate number of viral copies(<138 copies/mL). A negative result must be combined with clinical observations, patient history, and epidemiological information. The expected result is Negative.  Fact Sheet for Patients:  BloggerCourse.comhttps://www.fda.gov/media/152166/download  Fact Sheet for Healthcare Providers:  SeriousBroker.ithttps://www.fda.gov/media/152162/download  This test is no t yet approved or cleared by the [place] FDA and  has been authorized for detection and/or diagnosis of SARS-CoV-2 by FDA under an Emergency Use Authorization (EUA). This EUA will remain  in effect (meaning this test can be used) for the duration of the COVID-19 declaration under Section 564(b)(1) of the Act, 21 U.S.C.section 360bbb-3(b)(1), unless the authorization is terminated  or revoked sooner.       Influenza A by PCR NEGATIVE NEGATIVE Final   Influenza B by PCR NEGATIVE NEGATIVE Final    Comment: (NOTE) The Xpert Xpress SARS-CoV-2/FLU/RSV plus assay is intended as an aid in the diagnosis of influenza from Nasopharyngeal swab specimens and should not be used as a sole basis for treatment. Nasal washings and aspirates are unacceptable for Xpert Xpress SARS-CoV-2/FLU/RSV testing.  Fact Sheet for Patients: BloggerCourse.comhttps://www.fda.gov/media/152166/download  Fact Sheet for Healthcare  Providers: SeriousBroker.ithttps://www.fda.gov/media/152162/download  This test is not yet approved or cleared by the [place] FDA and has been authorized for detection and/or diagnosis of SARS-CoV-2 by FDA under an Emergency Use Authorization (EUA). This EUA will remain in effect (meaning this test can be used) for the duration of the COVID-19 declaration under Section 564(b)(1) of the Act, 21 U.S.C. section 360bbb-3(b)(1), unless the authorization is terminated or revoked.  Performed at [facility], [address] [place], [place] [id number]   Aerobic/Anaerobic Culture w Gram Stain (surgical/deep wound)     Status: None (Preliminary result)   Collection Time: 11/25/20  2:21 PM   Specimen: PATH Cytology Misc. fluid; Body Fluid  Result Value Ref Range Status   Specimen Description  Final    ABSCESS  LEFT FOOT Performed at [facility], [address] [place], [place] [address]    Special Requests   Final    NONE Performed at [facility], [address], [place], [place] [address]    Gram Stain   Final    FEW WBC PRESENT, PREDOMINANTLY MONONUCLEAR FEW GRAM POSITIVE COCCI Performed at [facility], [address] [place], [place] [address]    Culture   Final    RARE NORMAL SKIN FLORA NO ANAEROBES ISOLATED; CULTURE IN PROGRESS FOR 5 DAYS    Report Status PENDING  Incomplete     Labs: BNP (last 3 results) No results for input(s): BNP in the last 8760 hours. Basic Metabolic Panel: Recent Labs  Lab 11/24/20 1336 11/25/20 0902 11/26/20 0307 11/27/20 0320  NA 136 135 135 136  K 5.6* 3.6 3.5 4.0  CL 102 100 100 105  CO2 22 21* 26 21*  GLUCOSE 115* 98 94 88  BUN 14 11 13 14   CREATININE 0.88 0.72 0.79 0.78  CALCIUM 9.9 8.9 8.5* 8.7*  MG  --  1.6* 1.6* 1.9  PHOS  --  2.7 3.5 2.3*   Liver Function Tests: Recent Labs  Lab 11/24/20 1336 11/25/20 0902 11/26/20 0307 11/27/20 0320  AST 55* 22 21 22   ALT 31 20 18  19   ALKPHOS 110 85 84 77  BILITOT 1.6* 1.3* 1.2 1.0  PROT 8.2* 6.7 6.6 6.5  ALBUMIN 4.3 3.6 3.5 3.4*   No results for input(s): LIPASE, AMYLASE in the last 168 hours. No results for input(s): AMMONIA in the last 168 hours. CBC: Recent Labs  Lab 11/24/20 1336 11/25/20 0902 11/26/20 0307 11/27/20 0320  WBC 9.2 6.2 5.1 5.4  NEUTROABS 7.4 4.4 3.4 3.4  HGB 17.5* 14.2 13.8 13.6  HCT 51.2 42.5 41.8 41.6  MCV 92.4 93.0 95.2 95.4  PLT 267 205 211 223   Cardiac Enzymes: No results for input(s): CKTOTAL, CKMB, CKMBINDEX, TROPONINI in the last 168 hours. BNP: Invalid input(s): POCBNP CBG: No results for input(s): GLUCAP in the last 168 hours. D-Dimer No results for input(s): DDIMER in the last 72 hours. Hgb A1c Recent Labs    11/26/20 0307  HGBA1C 5.4   Lipid Profile No results for input(s): CHOL, HDL, LDLCALC, TRIG, CHOLHDL, LDLDIRECT in the last 72 hours. Thyroid function studies No results for input(s): TSH, T4TOTAL, T3FREE, THYROIDAB in the last 72 hours.  Invalid input(s): FREET3 Anemia work up No results for input(s): VITAMINB12, FOLATE, FERRITIN, TIBC, IRON, RETICCTPCT in the last 72 hours. Urinalysis No results found for: COLORURINE, APPEARANCEUR, LABSPEC, PHURINE, GLUCOSEU, HGBUR, BILIRUBINUR, KETONESUR, PROTEINUR, UROBILINOGEN, NITRITE, LEUKOCYTESUR Sepsis Labs Invalid input(s): PROCALCITONIN,  WBC,  LACTICIDVEN Microbiology Recent Results (from the past 240 hour(s))  Resp Panel by RT-PCR (Flu A&B, Covid) Nasopharyngeal Swab     Status: None   Collection Time: 11/24/20  3:12 PM   Specimen: Nasopharyngeal Swab; Nasopharyngeal(NP) swabs in vial transport medium  Result Value Ref Range Status   SARS Coronavirus 2 by RT PCR NEGATIVE NEGATIVE Final    Comment: (NOTE) SARS-CoV-2 target nucleic acids are NOT DETECTED.  The SARS-CoV-2 RNA is generally detectable in upper respiratory specimens during the acute phase of infection. The lowest concentration of SARS-CoV-2  viral copies this assay can detect is 138 copies/mL. A negative result does not preclude SARS-Cov-2 infection and should not be used as the sole basis for treatment or other patient management decisions.  A negative result may occur with  improper specimen collection/handling, submission of specimen other than nasopharyngeal swab, presence of viral mutation(s) within the areas targeted by this assay, and inadequate number of viral copies(<138 copies/mL). A negative result must be combined with clinical observations, patient history, and epidemiological information. The expected result is Negative.  Fact Sheet for Patients:  BloggerCourse.com  Fact Sheet for Healthcare Providers:  SeriousBroker.it  This test is no t yet approved or cleared by the [place] FDA and  has been authorized for detection and/or diagnosis of SARS-CoV-2 by FDA under an Emergency Use Authorization (EUA). This EUA will remain  in effect (meaning this test can be used) for the duration of the COVID-19 declaration under Section 564(b)(1) of the Act, 21 U.S.C.section 360bbb-3(b)(1), unless the authorization is terminated  or revoked sooner.       Influenza A by PCR NEGATIVE NEGATIVE Final   Influenza B by PCR NEGATIVE NEGATIVE Final    Comment: (NOTE) The Xpert Xpress SARS-CoV-2/FLU/RSV plus assay is intended as an aid in the diagnosis of influenza from Nasopharyngeal swab specimens and should not be used as a sole basis for treatment. Nasal washings and aspirates are unacceptable for Xpert Xpress SARS-CoV-2/FLU/RSV testing.  Fact Sheet for Patients: BloggerCourse.com  Fact Sheet for Healthcare Providers: SeriousBroker.it  This test is not yet approved or cleared by the [place] FDA and has been authorized for detection and/or diagnosis of SARS-CoV-2 by FDA under an Emergency Use Authorization  (EUA). This EUA will remain in effect (meaning this test can be used) for the duration of the COVID-19 declaration under Section 564(b)(1) of the Act, 21 U.S.C. section 360bbb-3(b)(1), unless the authorization is terminated or revoked.  Performed at [facility] [address] [place], [place] [address]   Aerobic/Anaerobic Culture w Gram Stain (surgical/deep wound)     Status: None (Preliminary result)   Collection Time: 11/25/20  2:21 PM   Specimen: PATH Cytology Misc. fluid; Body Fluid  Result Value Ref Range Status   Specimen Description   Final    ABSCESS  LEFT FOOT Performed at [facility], [address] [place], [place] [address]    Special Requests   Final    NONE Performed at [facility], [address] [place], [place] [address]    Gram Stain   Final    FEW WBC PRESENT, PREDOMINANTLY MONONUCLEAR FEW GRAM POSITIVE COCCI Performed at [facility], [address], [place], [place] [address]    Culture   Final    RARE NORMAL SKIN FLORA NO ANAEROBES ISOLATED; CULTURE IN PROGRESS FOR 5 DAYS    Report Status PENDING  Incomplete     Time coordinating discharge: 35 minutes  SIGNED:   Jacquelin Hawking, MD [facility] 11/28/2020, 11:39 AM

## 2020-11-28 NOTE — TOC Transition Note (Signed)
Transition of Care [facility]) - CM/SW Discharge Note  Patient Details  Name: Willie Collins MRN: [id number] Date of Birth: 11/22/1960  Transition of Care [facility]) CM/SW Contact:  Ewing Schlein, LCSW Phone Number: 11/28/2020, 1:49 PM  Clinical Narrative: CSW made referral for HHPT to Amy with [facility] for an LOG. [facility] declined referral. CSW spoke with patient and updated him that no [facility] agency was secured for HHPT. Patient confirmed receipt of charity DME (3N1, rolling walker). TOC signing off.  Final next level of care: Home/Self Care Barriers to Discharge: Barriers Unresolved (comment),No Home Care Agency will accept this patient (Unable to get HHPT for the patient)  Patient Goals and CMS Choice Patient states their goals for this hospitalization and ongoing recovery are:: Return home CMS Medicare.gov Compare Post Acute Care list provided to:: Patient Choice offered to / list presented to : Patient  Discharge Plan and Services In-house Referral: Clinical Social Work Post Acute Care Choice: Durable Medical Equipment,Home Health          DME Arranged: 3-N-1,Walker rolling DME Agency: [employer] Date DME Agency Contacted: 11/26/20 Representative spoke with at DME Agency: Ian Malkin  Readmission Risk Interventions No flowsheet data found.

## 2020-11-28 NOTE — Progress Notes (Signed)
Patient discharged to home w/ family. Given all belongings, instructions, equipment. Verbalized understanding of instructions. Escorted to pov via w/c. 

## 2020-11-28 NOTE — Discharge Instructions (Signed)
Willie Collins,  You were in the hospital because of a foot infection requiring surgery. You will be discharged with antibiotics and pain medication. Please see the podiatrist in 1 week. Please use your post-op shoe to ambulate.

## 2020-12-02 LAB — AEROBIC/ANAEROBIC CULTURE W GRAM STAIN (SURGICAL/DEEP WOUND): Culture: NORMAL

## 2020-12-04 ENCOUNTER — Encounter: Payer: Self-pay | Admitting: Podiatry

## 2020-12-04 ENCOUNTER — Ambulatory Visit (INDEPENDENT_AMBULATORY_CARE_PROVIDER_SITE_OTHER): Payer: Self-pay | Admitting: Podiatry

## 2020-12-04 ENCOUNTER — Other Ambulatory Visit: Payer: Self-pay

## 2020-12-04 DIAGNOSIS — L02612 Cutaneous abscess of left foot: Secondary | ICD-10-CM

## 2020-12-04 DIAGNOSIS — Z9889 Other specified postprocedural states: Secondary | ICD-10-CM

## 2020-12-04 MED ORDER — OXYCODONE-ACETAMINOPHEN 10-325 MG PO TABS: 1.0000 | ORAL_TABLET | ORAL | 0 refills | Status: DC | PRN

## 2020-12-06 ENCOUNTER — Encounter: Payer: Self-pay | Admitting: Podiatry

## 2020-12-06 NOTE — Progress Notes (Signed)
  Subjective:  Patient ID: Willie Collins, male    DOB: 12-05-1960,  MRN: [id number]  Chief Complaint  Patient presents with  . Routine Post Op    POST OP DOS 3.14.22    60 y.o. male returns for post-op check. Patient presents with status post incision and drainage washout debridement that was done on 11/25/2020.  Patient states he is doing well.  Had done the procedure of the hospital as an inpatient.  The pain medication is not helping.  He would like to know if he can go higher up on the pain medication.  No acute complaints.  He denies any other issues.  He has been taking his antibiotics.   Review of Systems: Negative except as noted in the HPI. Denies N/V/F/Ch.  Past Medical History:  Diagnosis Date  . Hypertension     Current Outpatient Medications:  .  oxyCODONE-acetaminophen (PERCOCET) 10-325 MG tablet, Take 1 tablet by mouth every 4 (four) hours as needed for pain., Disp: 30 tablet, Rfl: 0 .  amLODipine (NORVASC) 5 MG tablet, Take 1 tablet (5 mg total) by mouth daily., Disp: 90 tablet, Rfl: 0 .  indomethacin (INDOCIN) 50 MG capsule, Take 50 mg by mouth daily as needed (gout)., Disp: , Rfl:  .  oxyCODONE-acetaminophen (PERCOCET) 5-325 MG tablet, Take 1-2 tablets by mouth every 4 (four) hours as needed for severe pain., Disp: 30 tablet, Rfl: 0  Social History   Tobacco Use  Smoking Status Never Smoker  Smokeless Tobacco Never Used    No Known Allergies Objective:  There were no vitals filed for this visit. There is no height or weight on file to calculate BMI. Constitutional Well developed. Well nourished.  Vascular Foot warm and well perfused. Capillary refill normal to all digits.   Neurologic Normal speech. Oriented to person, place, and time. Epicritic sensation to light touch grossly present bilaterally.  Dermatologic Skin healing well without signs of infection. Skin edges well coapted without signs of infection.  Orthopedic: Tenderness to palpation noted  about the surgical site.   Radiographs: None Assessment:   1. Foot abscess, left   2. Status post foot surgery    Plan:  Patient was evaluated and treated and all questions answered.  S/p foot surgery left -Progressing as expected post-operatively. -XR: None -WB Status: Weight-bear as tolerated in cam boot -Sutures: Intact.  No signs of dehiscence noted.  No recurrent of abscess noted.  No other bony abnormality noted. -Medications: Percocet 10 mg -Foot redressed.  No follow-ups on file.

## 2020-12-18 ENCOUNTER — Other Ambulatory Visit: Payer: Self-pay

## 2020-12-18 ENCOUNTER — Ambulatory Visit (INDEPENDENT_AMBULATORY_CARE_PROVIDER_SITE_OTHER): Payer: Self-pay | Admitting: Podiatry

## 2020-12-18 ENCOUNTER — Encounter: Payer: Self-pay | Admitting: Podiatry

## 2020-12-18 DIAGNOSIS — L02612 Cutaneous abscess of left foot: Secondary | ICD-10-CM

## 2020-12-18 DIAGNOSIS — Z9889 Other specified postprocedural states: Secondary | ICD-10-CM

## 2020-12-19 ENCOUNTER — Encounter: Payer: Self-pay | Admitting: Podiatry

## 2020-12-19 NOTE — Progress Notes (Signed)
  Subjective:  Patient ID: Willie Collins, male    DOB: [birth year],  MRN: [id number]  Chief Complaint  Patient presents with  . Routine Post Op    60 y.o. male returns for post-op check. Patient presents with status post incision and drainage washout debridement that was done on 11/25/2020.  He is doing well.  His pain is gone down considerably.  He denies any other acute complaints.  He has completed his antibiotic course as well.   Review of Systems: Negative except as noted in the HPI. Denies N/V/F/Ch.  Past Medical History:  Diagnosis Date  . Hypertension     Current Outpatient Medications:  .  amLODipine (NORVASC) 5 MG tablet, Take 1 tablet (5 mg total) by mouth daily., Disp: 90 tablet, Rfl: 0 .  indomethacin (INDOCIN) 50 MG capsule, Take 50 mg by mouth daily as needed (gout)., Disp: , Rfl:  .  oxyCODONE-acetaminophen (PERCOCET) 10-325 MG tablet, Take 1 tablet by mouth every 4 (four) hours as needed for pain., Disp: 30 tablet, Rfl: 0 .  oxyCODONE-acetaminophen (PERCOCET) 5-325 MG tablet, Take 1-2 tablets by mouth every 4 (four) hours as needed for severe pain., Disp: 30 tablet, Rfl: 0  Social History   Tobacco Use  Smoking Status Never Smoker  Smokeless Tobacco Never Used    No Known Allergies Objective:  There were no vitals filed for this visit. There is no height or weight on file to calculate BMI. Constitutional Well developed. Well nourished.  Vascular Foot warm and well perfused. Capillary refill normal to all digits.   Neurologic Normal speech. Oriented to person, place, and time. Epicritic sensation to light touch grossly present bilaterally.  Dermatologic  skin completely epithelialized.  No clinical signs of infection.  Orthopedic:  No tenderness to palpation noted about the surgical site.   Radiographs: None Assessment:   1. Foot abscess, left   2. Status post foot surgery    Plan:  Patient was evaluated and treated and all questions  answered.  S/p foot surgery left -Clinically healed.  No further concern for abscess formation noted.  No clinical signs of infection noted.  The skin has completely reepithelialized.  At this time patient is officially discharged from my care if any foot and ankle issues arise in future [place] and come back and see me.  He states understanding  No follow-ups on file.

## 2020-12-26 ENCOUNTER — Telehealth: Payer: Self-pay

## 2020-12-26 NOTE — Telephone Encounter (Signed)
Called patient using interpreter services advising patient that I was calling from [facility] in regards to his upcoming appointment for wed 4/20. Unfortunately the provider is not going to be in the office and this appointment has been switched to virtual. If he prefers an in-person appointment I advised him to call [phone] to schedule or patient may be referred to mobile.   Wednesday 4/20 mobile will be located at [address] from 05-1214 and 2-6pm for walk-in visits.

## 2021-01-01 ENCOUNTER — Other Ambulatory Visit: Payer: Self-pay

## 2021-01-01 ENCOUNTER — Ambulatory Visit: Payer: Self-pay | Attending: Physician Assistant | Admitting: Physician Assistant

## 2021-01-01 NOTE — Progress Notes (Deleted)
Patient ID: Willie Collins, male   DOB: 1961/08/24, 60 y.o.   MRN: [id number]  Virtual Visit via Telephone Note  I connected with Willie Collins on 01/01/21 at  1:50 PM EDT by telephone and verified that I am speaking with the correct person using two identifiers.  Location: Patient: home Provider: [facility] office [employer] with pacific interpreters translating   I discussed the limitations, risks, security and privacy concerns of performing an evaluation and management service by telephone and the availability of in person appointments. I also discussed with the patient that there may be a patient responsible charge related to this service. The patient expressed understanding and agreed to proceed.   History of Present Illness: fter hospitalization 3/13-3/17/2022 for L foot abscess  then subsequent 12/22/2020 ED visit for elbow pain.    From discharge summary: Hospital course:  Left foot abscess Patient with POA I&D at outside hospital ED with subsequent antibiotic failure (Bactrim DS); no culture data appears to be available. On this admission, empiric Vancomycin initiated, podiatry was consulted and patient underwent I&D in the OR on 3/14. Abscess cultures with normal flora. Patient transitioned from Vancomycin IV to doxycycline 100 mg BID and Augmentin 500-125 mg TID on discharge to complete a 7 day course of antibiotics. Podiatry recommending outpatient follow-up in 1 week for wound check.  Elevated blood pressure No history of hypertension but patient has been significantly hypertensive while admitted. Started on amlodipine 5 mg daily.   Anxiety Situational and in setting of planned I&D.  Hyperkalemia Mild. Resolved.  Hypomagnesemia Mild. Resolved.  Hyperbilirubinemia Transient. Resolved.  Lightheadedness In setting of ambulation with PT. Possibly orthostatic, however, no orthostatic vitals performed. Patient given a NS bolus at the time. PT planning to work  with him today.  Elevated AST Minimal. Resolved.  Metabolic acidosis Mild. Outpatient follow-up.  Hyperglycemia Negligible.  Discharge Diagnoses:  Principal Problem:   Foot abscess, left Active Problems:   Anxiety   Hyperkalemia  From 12/18/2020 podiatry f/up Patient was evaluated and treated and all questions answered.  S/p foot surgery left -Clinically healed.  No further concern for abscess formation noted.  No clinical signs of infection noted.  The skin has completely reepithelialized.  At this time patient is officially discharged from my care if any foot and ankle issues arise in future [facility] and come back and see me.  He states understanding   From [facility] ED note 4/10  Chief Complaint  Patient presents with  . Gout Pain  Pt reports hx of gout, c/o pain to right elbow. [facility] pt.   Patient comes in with complaints of right elbow pain. He does have a history of gout. He feels that the symptoms may be related to a gout flare. Symptoms started today. Pain is severe. He denies any recent injury. He denies numbness, weakness, fever or chills.  History provided by: Patient  Past Medical History:  Diagnosis Date  . Anxiety  . Depression  . Gout     PREDNISONE (DELTASONE) 10 MG TABLET 6 tabs daily x2 days, 5 tabs daily x2 days, 4 tabs daily x2 days, 3 tabs daily x2 days, 2 tabs daily x2 days, 1 tablet daily x2 days  Quantity: 42 tablet Refills: 0  From podiatry follow-up 12/18/2020:  S/p foot surgery left -Clinically healed.  No further concern for abscess formation noted.  No clinical signs of infection noted.  The skin has completely reepithelialized.  At this time patient is officially discharged from my care if any foot  and ankle issues arise in future [facility] and come back and see me.  He states understanding    Observations/Objective:   Assessment and Plan:   Follow Up Instructions:    I discussed the assessment and treatment plan with the patient.  The patient was provided an opportunity to ask questions and all were answered. The patient agreed with the plan and demonstrated an understanding of the instructions.   The patient was advised to call back or seek an in-person evaluation if the symptoms worsen or if the condition fails to improve as anticipated.  I provided *** minutes of non-face-to-face time during this encounter.   Georgian Co, PA-C

## 2021-02-09 ENCOUNTER — Emergency Department (HOSPITAL_COMMUNITY)
Admission: EM | Admit: 2021-02-09 | Discharge: 2021-02-10 | Disposition: A | Discharge status: Home / Self Care | Payer: Medicaid Other | Attending: Emergency Medicine | Admitting: Emergency Medicine

## 2021-02-09 ENCOUNTER — Other Ambulatory Visit: Payer: Self-pay

## 2021-02-09 ENCOUNTER — Emergency Department (HOSPITAL_COMMUNITY): Payer: Medicaid Other

## 2021-02-09 ENCOUNTER — Encounter (HOSPITAL_COMMUNITY): Payer: Self-pay | Admitting: Emergency Medicine

## 2021-02-09 DIAGNOSIS — R519 Headache, unspecified: Secondary | ICD-10-CM | POA: Insufficient documentation

## 2021-02-09 DIAGNOSIS — Z79899 Other long term (current) drug therapy: Secondary | ICD-10-CM | POA: Insufficient documentation

## 2021-02-09 DIAGNOSIS — M25521 Pain in right elbow: Secondary | ICD-10-CM | POA: Insufficient documentation

## 2021-02-09 DIAGNOSIS — I10 Essential (primary) hypertension: Secondary | ICD-10-CM | POA: Insufficient documentation

## 2021-02-09 LAB — CBC WITH DIFFERENTIAL/PLATELET
Abs Immature Granulocytes: 0.09 10*3/uL — ABNORMAL HIGH (ref 0.00–0.07)
Basophils Absolute: 0 10*3/uL (ref 0.0–0.1)
Basophils Relative: 0 %
Eosinophils Absolute: 0 10*3/uL (ref 0.0–0.5)
Eosinophils Relative: 0 %
HCT: 37.8 % — ABNORMAL LOW (ref 39.0–52.0)
Hemoglobin: 12.7 g/dL — ABNORMAL LOW (ref 13.0–17.0)
Immature Granulocytes: 1 %
Lymphocytes Relative: 16 %
Lymphs Abs: 2.1 10*3/uL (ref 0.7–4.0)
MCH: 29.5 pg (ref 26.0–34.0)
MCHC: 33.6 g/dL (ref 30.0–36.0)
MCV: 87.7 fL (ref 80.0–100.0)
Monocytes Absolute: 1 10*3/uL (ref 0.1–1.0)
Monocytes Relative: 8 %
Neutro Abs: 9.6 10*3/uL — ABNORMAL HIGH (ref 1.7–7.7)
Neutrophils Relative %: 75 %
Platelets: 368 10*3/uL (ref 150–400)
RBC: 4.31 MIL/uL (ref 4.22–5.81)
RDW: 12.3 % (ref 11.5–15.5)
WBC: 12.8 10*3/uL — ABNORMAL HIGH (ref 4.0–10.5)
nRBC: 0 % (ref 0.0–0.2)

## 2021-02-09 MED ORDER — HYDROMORPHONE HCL 1 MG/ML IJ SOLN
1.0000 mg | Freq: Once | INTRAMUSCULAR | Status: AC
  Administered 2021-02-09: 1 mg via INTRAVENOUS
  Filled 2021-02-09: qty 1

## 2021-02-09 MED ORDER — ONDANSETRON HCL 4 MG/2ML IJ SOLN
4.0000 mg | Freq: Once | INTRAMUSCULAR | Status: AC
  Administered 2021-02-09: 4 mg via INTRAVENOUS
  Filled 2021-02-09: qty 2

## 2021-02-09 MED ORDER — KETOROLAC TROMETHAMINE 30 MG/ML IJ SOLN
30.0000 mg | Freq: Once | INTRAMUSCULAR | Status: AC
  Administered 2021-02-09: 30 mg via INTRAVENOUS
  Filled 2021-02-09: qty 1

## 2021-02-09 NOTE — ED Provider Notes (Signed)
[facility]-EMERGENCY DEPT Provider Note   CSN: [id number] Arrival date & time: 02/09/21  1948     History Chief Complaint  Patient presents with  . Joint Swelling  . Arm Pain    Willie Collins is a 60 y.o. male.  He is right-handed.  He awoke with severe pain and swelling of his right elbow.  The day before this started, he helped his friend throw out some trash.  He denies any IVs, injections, procedures, or other breaks in the skin on this area.  The history is provided by the patient.  Arm Pain This is a new problem. The current episode started 12 to 24 hours ago. The problem occurs constantly. The problem has been rapidly worsening. Associated symptoms include headaches. Pertinent negatives include no chest pain, no abdominal pain and no shortness of breath. Exacerbated by: any use of his arm. Nothing relieves the symptoms. He has tried rest for the symptoms. The treatment provided no relief.       Past Medical History:  Diagnosis Date  . Hypertension     Patient Active Problem List   Diagnosis Date Noted  . Foot abscess, left 11/24/2020  . Anxiety 11/24/2020  . Hyperkalemia 11/24/2020    Past Surgical History:  Procedure Laterality Date  . IRRIGATION AND DEBRIDEMENT FOOT Left 11/25/2020   Procedure: IRRIGATION AND DEBRIDEMENT FOOT;  Surgeon: Candelaria Stagers, DPM;  Location: [facility] ORS;  Service: Podiatry;  Laterality: Left;       No family history on file.  Social History   Tobacco Use  . Smoking status: Never Smoker  . Smokeless tobacco: Never Used  Vaping Use  . Vaping Use: Never used  Substance Use Topics  . Alcohol use: Yes    Comment: weekends  . Drug use: Never    Home Medications Prior to Admission medications   Medication Sig Start Date End Date Taking? Authorizing Provider  amLODipine (NORVASC) 5 MG tablet Take 1 tablet (5 mg total) by mouth daily. 11/29/20   Narda Bonds, MD  indomethacin (INDOCIN) 50 MG capsule  Take 50 mg by mouth daily as needed (gout).    [provider]  oxyCODONE-acetaminophen (PERCOCET) 10-325 MG tablet Take 1 tablet by mouth every 4 (four) hours as needed for pain. 12/04/20   Candelaria Stagers, DPM  oxyCODONE-acetaminophen (PERCOCET) 5-325 MG tablet Take 1-2 tablets by mouth every 4 (four) hours as needed for severe pain. 11/25/20   Candelaria Stagers, DPM    Allergies    Patient has no known allergies.  Review of Systems   Review of Systems  Constitutional: Negative for chills and fever.  HENT: Negative for ear pain and sore throat.   Eyes: Negative for pain and visual disturbance.  Respiratory: Negative for cough and shortness of breath.   Cardiovascular: Negative for chest pain and palpitations.  Gastrointestinal: Negative for abdominal pain and vomiting.  Genitourinary: Negative for dysuria and hematuria.  Musculoskeletal: Negative for arthralgias and back pain.  Skin: Negative for color change and rash.  Neurological: Positive for headaches. Negative for seizures and syncope.  All other systems reviewed and are negative.   Physical Exam Updated Vital Signs BP (!) 158/97 (BP Location: Left Arm)   Pulse (!) 104   Temp 98.2 F (36.8 C) (Oral)   Resp 18   Ht 5\' 6"  (1.676 m)   Wt 68 kg   SpO2 96%   BMI 24.21 kg/m   Physical Exam Vitals and nursing note  reviewed.  Constitutional:      Appearance: Normal appearance.  HENT:     Head: Normocephalic and atraumatic.  Eyes:     Conjunctiva/sclera: Conjunctivae normal.  Pulmonary:     Effort: Pulmonary effort is normal. No respiratory distress.  Musculoskeletal:     Cervical back: Normal range of motion.     Comments: The entire right elbow is swollen with a joint effusion evident.  There is erythema at the distal aspect of the right upper arm that extends over the elbow joint and the proximal forearm.  He is holding his elbow at approximately 90 degrees of flexion, and any extension or rotation of the elbow  joint provokes severe pain.  Skin:    General: Skin is warm and dry.     Capillary Refill: Capillary refill takes less than 2 seconds.  Neurological:     General: No focal deficit present.     Mental Status: He is alert and oriented to person, place, and time. Mental status is at baseline.  Psychiatric:        Mood and Affect: Mood normal.     ED Results / Procedures / Treatments   Labs (all labs ordered are listed, but only abnormal results are displayed) Labs Reviewed  COMPREHENSIVE METABOLIC PANEL - Abnormal; Notable for the following components:      Result Value   Potassium 3.2 (*)    Glucose, Bld 103 (*)    Albumin 3.4 (*)    AST 13 (*)    All other components within normal limits  CBC WITH DIFFERENTIAL/PLATELET - Abnormal; Notable for the following components:   WBC 12.8 (*)    Hemoglobin 12.7 (*)    HCT 37.8 (*)    Neutro Abs 9.6 (*)    Abs Immature Granulocytes 0.09 (*)    All other components within normal limits  URINALYSIS, ROUTINE W REFLEX MICROSCOPIC - Abnormal; Notable for the following components:   Ketones, ur 20 (*)    All other components within normal limits  SEDIMENTATION RATE - Abnormal; Notable for the following components:   Sed Rate 89 (*)    All other components within normal limits  C-REACTIVE PROTEIN - Abnormal; Notable for the following components:   CRP 14.7 (*)    All other components within normal limits  CULTURE, BLOOD (ROUTINE X 2)  CULTURE, BLOOD (ROUTINE X 2)  LACTIC ACID, PLASMA  LACTIC ACID, PLASMA    EKG None  Radiology DG Elbow Complete Right  Result Date: 02/09/2021 CLINICAL DATA:  60 year old male with right elbow pain and swelling. EXAM: RIGHT FOREARM - 2 VIEW; RIGHT ELBOW - COMPLETE 3+ VIEW COMPARISON:  None. FINDINGS: There is no acute fracture or dislocation. Old healed fracture deformity of the humeral diaphysis. There is elevation of the anterior fat pad suggestive of joint effusion. There is amorphous calcific density  in the posterior elbow concerning for crystal related arthropathy including gout or CPPD. Clinical correlation is recommended. IMPRESSION: 1. No acute fracture or dislocation. 2. A small elbow effusion with findings concerning for crystal related arthropathy including gout or CPPD. Clinical correlation is recommended. Electronically Signed   By: Elgie Collard M.D.   On: 02/09/2021 20:30   DG Forearm Right  Result Date: 02/09/2021 CLINICAL DATA:  60 year old male with right elbow pain and swelling. EXAM: RIGHT FOREARM - 2 VIEW; RIGHT ELBOW - COMPLETE 3+ VIEW COMPARISON:  None. FINDINGS: There is no acute fracture or dislocation. Old healed fracture deformity of the humeral diaphysis.  There is elevation of the anterior fat pad suggestive of joint effusion. There is amorphous calcific density in the posterior elbow concerning for crystal related arthropathy including gout or CPPD. Clinical correlation is recommended. IMPRESSION: 1. No acute fracture or dislocation. 2. A small elbow effusion with findings concerning for crystal related arthropathy including gout or CPPD. Clinical correlation is recommended. Electronically Signed   By: Elgie Collard M.D.   On: 02/09/2021 20:30    Procedures Procedures   Medications Ordered in ED Medications  ketorolac (TORADOL) 30 MG/ML injection 30 mg (30 mg Intravenous Given 02/09/21 2120)  HYDROmorphone (DILAUDID) injection 1 mg (1 mg Intravenous Given 02/09/21 2120)  ondansetron (ZOFRAN) injection 4 mg (4 mg Intravenous Given 02/09/21 2118)  ketorolac (TORADOL) 30 MG/ML injection 30 mg (30 mg Intravenous Given 02/09/21 2355)  HYDROmorphone (DILAUDID) injection 1 mg (1 mg Intravenous Given 02/09/21 2355)  ondansetron (ZOFRAN) injection 4 mg (4 mg Intravenous Given 02/09/21 2355)  HYDROmorphone (DILAUDID) injection 1 mg (1 mg Intravenous Given 02/10/21 0231)  colchicine tablet 1.2 mg (1.2 mg Oral Given 02/10/21 0239)  predniSONE (DELTASONE) tablet 60 mg (60 mg Oral  Given 02/10/21 0239)  colchicine tablet 0.6 mg (0.6 mg Oral Given 02/10/21 0347)    ED Course  I have reviewed the triage vital signs and the nursing notes.  Pertinent labs & imaging results that were available during my care of the patient were reviewed by me and considered in my medical decision making (see chart for details).    MDM Rules/Calculators/A&P                          Milt Collins presents with sudden onset of right elbow pain.  Exam is concerning for septic joint versus gout.  He does have evidence of possible crystal arthropathy on x-ray, but he has no history of the same.  He is awaiting labs, and will likely need Ortho consult. Final Clinical Impression(s) / ED Diagnoses Final diagnoses:  Right elbow pain    Rx / DC Orders ED Discharge Orders         Ordered    morphine (MSIR) 15 MG tablet  Every 4 hours PRN        02/10/21 0354    predniSONE (DELTASONE) 20 MG tablet        02/10/21 0354           Koleen Distance, MD 02/10/21 1715

## 2021-02-09 NOTE — ED Notes (Signed)
Patient transported to X-ray 

## 2021-02-09 NOTE — ED Triage Notes (Signed)
Patient arrives complaining of sudden onset of pain and swelling. Patient was helping his friend throw trash away yesterday. Patient states no injury. Patient states during the night, the pain started to increase and today the pain got worse. Elbow noted to be red and hot, swelling from mid forearm to above the elbow. Patient decreased movement in the arm.

## 2021-02-10 LAB — URINALYSIS, ROUTINE W REFLEX MICROSCOPIC
Bilirubin Urine: NEGATIVE
Glucose, UA: NEGATIVE mg/dL
Hgb urine dipstick: NEGATIVE
Ketones, ur: 20 mg/dL — AB
Leukocytes,Ua: NEGATIVE
Nitrite: NEGATIVE
Protein, ur: NEGATIVE mg/dL
Specific Gravity, Urine: 1.015 (ref 1.005–1.030)
pH: 6 (ref 5.0–8.0)

## 2021-02-10 LAB — COMPREHENSIVE METABOLIC PANEL
ALT: 13 U/L (ref 0–44)
AST: 13 U/L — ABNORMAL LOW (ref 15–41)
Albumin: 3.4 g/dL — ABNORMAL LOW (ref 3.5–5.0)
Alkaline Phosphatase: 60 U/L (ref 38–126)
Anion gap: 11 (ref 5–15)
BUN: 12 mg/dL (ref 8–23)
CO2: 27 mmol/L (ref 22–32)
Calcium: 9.1 mg/dL (ref 8.9–10.3)
Chloride: 98 mmol/L (ref 98–111)
Creatinine, Ser: 0.81 mg/dL (ref 0.61–1.24)
GFR, Estimated: >60 mL/min (ref >60)
Glucose, Bld: 103 mg/dL — ABNORMAL HIGH (ref 70–99)
Potassium: 3.2 mmol/L — ABNORMAL LOW (ref 3.5–5.1)
Sodium: 136 mmol/L (ref 135–145)
Total Bilirubin: 1.1 mg/dL (ref 0.3–1.2)
Total Protein: 7.3 g/dL (ref 6.5–8.1)

## 2021-02-10 LAB — LACTIC ACID, PLASMA
Lactic Acid, Venous: 0.8 mmol/L (ref 0.5–1.9)
Lactic Acid, Venous: 1.1 mmol/L (ref 0.5–1.9)

## 2021-02-10 LAB — SEDIMENTATION RATE: Sed Rate: 89 mm/hr — ABNORMAL HIGH (ref 0–16)

## 2021-02-10 LAB — C-REACTIVE PROTEIN: CRP: 14.7 mg/dL — ABNORMAL HIGH (ref <1.0)

## 2021-02-10 MED ORDER — LIDOCAINE HCL 2 % IJ SOLN
INTRAMUSCULAR | Status: AC
  Filled 2021-02-10: qty 20

## 2021-02-10 MED ORDER — MORPHINE SULFATE 15 MG PO TABS: 7.5000 mg | ORAL_TABLET | ORAL | 0 refills | Status: DC | PRN

## 2021-02-10 MED ORDER — HYDROMORPHONE HCL 1 MG/ML IJ SOLN
1.0000 mg | Freq: Once | INTRAMUSCULAR | Status: AC
  Administered 2021-02-10: 1 mg via INTRAVENOUS
  Filled 2021-02-10: qty 1

## 2021-02-10 MED ORDER — PREDNISONE 20 MG PO TABS: ORAL_TABLET | ORAL | 0 refills | Status: DC

## 2021-02-10 MED ORDER — COLCHICINE 0.6 MG PO TABS
0.6000 mg | ORAL_TABLET | Freq: Once | ORAL | Status: AC
  Administered 2021-02-10: 0.6 mg via ORAL
  Filled 2021-02-10: qty 1

## 2021-02-10 MED ORDER — PREDNISONE 20 MG PO TABS
60.0000 mg | ORAL_TABLET | Freq: Once | ORAL | Status: AC
  Administered 2021-02-10: 60 mg via ORAL
  Filled 2021-02-10: qty 3

## 2021-02-10 MED ORDER — COLCHICINE 0.6 MG PO TABS
1.2000 mg | ORAL_TABLET | Freq: Once | ORAL | Status: AC
  Administered 2021-02-10: 1.2 mg via ORAL
  Filled 2021-02-10: qty 2

## 2021-02-10 NOTE — ED Notes (Signed)
Pt discharged from this ED in stable condition at this time. All discharge instructions and follow up care reviewed with pt with no further questions at this time. Pt ambulatory with steady gait, clear speech.  

## 2021-02-10 NOTE — Discharge Instructions (Addendum)
Follow up with ortho in the office.     Take tylenol 1000mg (2 extra strength) four times a day. Take the steroids as prescribed.  Return for worsening pain, fever.   Then take the pain medicine if you feel like you need it. Narcotics do not help with the pain, they only make you care about it less.  You can become addicted to this, people may break into your house to steal it.  It will constipate you.  If you drive under the influence of this medicine you can get a DUI.

## 2021-02-10 NOTE — ED Provider Notes (Addendum)
I received the patient in signout from Dr. Delford Field, the patient is a 60 year old male with a chief complaint of right elbow pain and swelling.  It helped someone move yesterday and then woke up this morning with severe pain and redness to the area.  There is some concern for septic arthritis and so he is awaiting lab evaluation.  I did attempt arthrocentesis and had a dry tap.  CRP is 14.7 patient's white count is 12.8.  Plain film with questionable crystal arthropathy and elbow effusion.  I discussed the Case with ortho on call, Dr Yevette Edwards.  Based on my exam and my description of attempted arthrocentesis and the patient's blood work the patient's history of gout he felt it was less likely to be septic arthritis with a dry tap.  Recommending treatment for gout at this time and follow-up with elbow specialist on Tuesday.  I discussed the results with the patient.  Comfortable with the plan.  Will give 2 doses of colchicine here followed by course of steroids.  .Joint Aspiration/Arthrocentesis  Date/Time: 02/10/2021 3:57 AM Performed by: Melene Plan, DO Authorized by: Melene Plan, DO   Consent:    Consent obtained:  Verbal   Consent given by:  Patient   Risks, benefits, and alternatives were discussed: yes     Risks discussed:  Bleeding, infection and incomplete drainage   Alternatives discussed:  No treatment and delayed treatment Universal protocol:    Procedure explained and questions answered to patient or proxy's satisfaction: yes     Patient identity confirmed:  Verbally with patient Location:    Location:  Elbow   Elbow:  R elbow Anesthesia:    Anesthesia method:  Local infiltration   Local anesthetic:  Lidocaine 2% w/o epi Procedure details:    Preparation: Patient was prepped and draped in usual sterile fashion     Needle gauge: 21G.   Ultrasound guidance: no     Approach:  Lateral   Aspirate amount:  0   Aspirate characteristics:  Blood-tinged   Steroid injected: no     Specimen  collected: no   Post-procedure details:    Dressing:  Adhesive bandage   Procedure completion:  Tolerated well, no immediate complications Comments:     Dry tap      Melene Plan, DO 02/10/21 0356    Melene Plan, DO 02/10/21 2633

## 2021-02-15 LAB — CULTURE, BLOOD (ROUTINE X 2)
Culture: NO GROWTH
Culture: NO GROWTH
Special Requests: ADEQUATE
Special Requests: ADEQUATE

## 2021-02-21 ENCOUNTER — Encounter (HOSPITAL_COMMUNITY): Payer: Self-pay | Admitting: Emergency Medicine

## 2021-02-21 ENCOUNTER — Emergency Department (HOSPITAL_COMMUNITY): Payer: Self-pay

## 2021-02-21 ENCOUNTER — Other Ambulatory Visit: Payer: Self-pay

## 2021-02-21 ENCOUNTER — Emergency Department (HOSPITAL_COMMUNITY)
Admission: EM | Admit: 2021-02-21 | Discharge: 2021-02-22 | Disposition: A | Discharge status: Home / Self Care | Payer: Self-pay | Attending: Emergency Medicine | Admitting: Emergency Medicine

## 2021-02-21 DIAGNOSIS — I10 Essential (primary) hypertension: Secondary | ICD-10-CM | POA: Insufficient documentation

## 2021-02-21 DIAGNOSIS — M25521 Pain in right elbow: Secondary | ICD-10-CM | POA: Insufficient documentation

## 2021-02-21 DIAGNOSIS — M109 Gout, unspecified: Secondary | ICD-10-CM | POA: Insufficient documentation

## 2021-02-21 DIAGNOSIS — Z79899 Other long term (current) drug therapy: Secondary | ICD-10-CM | POA: Insufficient documentation

## 2021-02-21 LAB — CBC WITH DIFFERENTIAL/PLATELET
Abs Immature Granulocytes: 0.04 10*3/uL (ref 0.00–0.07)
Basophils Absolute: 0 10*3/uL (ref 0.0–0.1)
Basophils Relative: 0 %
Eosinophils Absolute: 0.1 10*3/uL (ref 0.0–0.5)
Eosinophils Relative: 1 %
HCT: 40.4 % (ref 39.0–52.0)
Hemoglobin: 13.3 g/dL (ref 13.0–17.0)
Immature Granulocytes: 1 %
Lymphocytes Relative: 43 %
Lymphs Abs: 2.8 10*3/uL (ref 0.7–4.0)
MCH: 28.9 pg (ref 26.0–34.0)
MCHC: 32.9 g/dL (ref 30.0–36.0)
MCV: 87.6 fL (ref 80.0–100.0)
Monocytes Absolute: 0.4 10*3/uL (ref 0.1–1.0)
Monocytes Relative: 6 %
Neutro Abs: 3.2 10*3/uL (ref 1.7–7.7)
Neutrophils Relative %: 49 %
Platelets: 605 10*3/uL — ABNORMAL HIGH (ref 150–400)
RBC: 4.61 MIL/uL (ref 4.22–5.81)
RDW: 12.7 % (ref 11.5–15.5)
WBC: 6.5 10*3/uL (ref 4.0–10.5)
nRBC: 0 % (ref 0.0–0.2)

## 2021-02-21 LAB — BASIC METABOLIC PANEL
Anion gap: 13 (ref 5–15)
BUN: 10 mg/dL (ref 8–23)
CO2: 22 mmol/L (ref 22–32)
Calcium: 9.5 mg/dL (ref 8.9–10.3)
Chloride: 105 mmol/L (ref 98–111)
Creatinine, Ser: 0.8 mg/dL (ref 0.61–1.24)
GFR, Estimated: >60 mL/min (ref >60)
Glucose, Bld: 102 mg/dL — ABNORMAL HIGH (ref 70–99)
Potassium: 3.2 mmol/L — ABNORMAL LOW (ref 3.5–5.1)
Sodium: 140 mmol/L (ref 135–145)

## 2021-02-21 NOTE — ED Provider Notes (Signed)
Emergency Medicine Provider Triage Evaluation Note  Willie Collins , a 60 y.o. male  was evaluated in triage.  Pt complains of right elbow pain since last ED visit, worsened, irritated with movement, no alleviating factors. Was unable to follow up with orthopedics.  Review of Systems  Positive: Elbow pain Negative: Fever, chills  Physical Exam  BP (!) 135/107 (BP Location: Left Arm)   Pulse (!) 110   Temp 97.8 F (36.6 C) (Oral)   Resp 15   Ht 5\' 6"  (1.676 m)   Wt 65.8 kg   SpO2 98%   BMI 23.40 kg/m  Gen:   Awake, tearful Resp:  Normal effort MSK:   RUE: mild erythema to posterior right elbow. Able to fully extend the elbow, flexes to 90 degrees actively. Tender to posterior elbow. NVI distally.   Medical Decision Making  Medically screening exam initiated at 10:17 PM.  Appropriate orders placed.  Willie Collins was informed that the remainder of the evaluation will be completed by another provider, this initial triage assessment does not replace that evaluation, and the importance of remaining in the ED until their evaluation is complete.  Elbow pain.    02/21/21 2219    2220, DO 02/21/21 2228

## 2021-02-21 NOTE — ED Notes (Signed)
Pt called out twice for pain medication since arrival to ED room 10.  Explained to pt that MD will have to order the medication and once medication is ordered the RN can administer.  Pt verbalized understanding, however continues to call out.  Will notify MD.

## 2021-02-21 NOTE — ED Triage Notes (Signed)
Patient c/o right elbow pain. Recently seen and admitted for same. Unable to follow up with orthopedics due to reported finances. Elbow red. States pain worsened after beginning work again.

## 2021-02-21 NOTE — ED Notes (Signed)
Attempted lab draw x 2 but unsuccessful. RN, Alyssa made aware.

## 2021-02-22 LAB — SYNOVIAL CELL COUNT + DIFF, W/ CRYSTALS
Eosinophils-Synovial: 0 % (ref 0–1)
Lymphocytes-Synovial Fld: 6 % (ref 0–20)
Monocyte-Macrophage-Synovial Fluid: 39 % — ABNORMAL LOW (ref 50–90)
Neutrophil, Synovial: 55 % — ABNORMAL HIGH (ref 0–25)
WBC, Synovial: 7070 /mm3 — ABNORMAL HIGH (ref 0–200)

## 2021-02-22 MED ORDER — LIDOCAINE HCL (PF) 1 % IJ SOLN
5.0000 mL | Freq: Once | INTRAMUSCULAR | Status: AC
  Administered 2021-02-22: 5 mL via INTRADERMAL
  Filled 2021-02-22: qty 30

## 2021-02-22 MED ORDER — KETOROLAC TROMETHAMINE 15 MG/ML IJ SOLN
15.0000 mg | Freq: Once | INTRAMUSCULAR | Status: AC
  Administered 2021-02-22: 15 mg via INTRAVENOUS
  Filled 2021-02-22: qty 1

## 2021-02-22 MED ORDER — HYDROCODONE-ACETAMINOPHEN 5-325 MG PO TABS
1.0000 | ORAL_TABLET | Freq: Once | ORAL | Status: AC
  Administered 2021-02-22: 1 via ORAL
  Filled 2021-02-22: qty 1

## 2021-02-22 MED ORDER — MORPHINE SULFATE (PF) 4 MG/ML IV SOLN
4.0000 mg | Freq: Once | INTRAVENOUS | Status: AC
  Administered 2021-02-22: 4 mg via INTRAVENOUS
  Filled 2021-02-22: qty 1

## 2021-02-22 MED ORDER — INDOMETHACIN 50 MG PO CAPS: 50.0000 mg | ORAL_CAPSULE | Freq: Three times a day (TID) | ORAL | 0 refills | Status: AC

## 2021-02-22 MED ORDER — COLCHICINE 0.3 MG HALF TABLET
0.3000 mg | ORAL_TABLET | Freq: Once | ORAL | Status: AC
  Administered 2021-02-22: 0.3 mg via ORAL
  Filled 2021-02-22: qty 1

## 2021-02-22 NOTE — ED Notes (Signed)
Attempted to use ice to help alleviate pain of right elbow, pt stated the ice felt worse, elevated right elbow on pillow, pt stated pillow is helpful.  Dr.Cardama in another room with a patient, will notify MD of pt's pain once MD is available.

## 2021-02-22 NOTE — ED Notes (Signed)
ED Provider at bedside. 

## 2021-02-22 NOTE — ED Provider Notes (Signed)
[facility]-EMERGENCY DEPT Provider Note  CSN: [id number] Arrival date & time: 02/21/21 2137  Chief Complaint(s) Arm Pain  HPI Willie Collins is a 60 y.o. male with a history of gout here for right elbow pain for several weeks. Persistent since onset. Gradually worsening. Seen last week for the same and had a dry tap. Recommended orthopedic follow-up the patient cannot do so due to financial constraints. Reports that this pain does not feel like his gout because it has lasted for too long in typically his pain improves after regimen of colchicine. Denies any falls or trauma. No fevers or chills. No other physical complaints.   Arm Pain   Past Medical History Past Medical History:  Diagnosis Date   Hypertension    Patient Active Problem List   Diagnosis Date Noted   Foot abscess, left 11/24/2020   Anxiety 11/24/2020   Hyperkalemia 11/24/2020   Home Medication(s) Prior to Admission medications   Medication Sig Start Date End Date Taking? Authorizing Provider  amLODipine (NORVASC) 5 MG tablet Take 1 tablet (5 mg total) by mouth daily. 11/29/20   Narda BondsNettey, Ralph A, MD  indomethacin (INDOCIN) 50 MG capsule Take 1 capsule (50 mg total) by mouth 3 (three) times daily with meals for 5 days. 02/22/21 02/27/21  Nira Connardama, Heidemarie Goodnow Eduardo, MD  morphine (MSIR) 15 MG tablet Take 0.5 tablets (7.5 mg total) by mouth every 4 (four) hours as needed for severe pain. 02/10/21   Melene PlanFloyd, Dan, DO  oxyCODONE-acetaminophen (PERCOCET) 10-325 MG tablet Take 1 tablet by mouth every 4 (four) hours as needed for pain. 12/04/20   Candelaria StagersPatel, Kevin P, DPM  oxyCODONE-acetaminophen (PERCOCET) 5-325 MG tablet Take 1-2 tablets by mouth every 4 (four) hours as needed for severe pain. 11/25/20   Candelaria StagersPatel, Kevin P, DPM  predniSONE (DELTASONE) 20 MG tablet 2 tabs po daily x 4 days 02/10/21   Melene PlanFloyd, Dan, DO                                                                                                                                     Past Surgical History Past Surgical History:  Procedure Laterality Date   IRRIGATION AND DEBRIDEMENT FOOT Left 11/25/2020   Procedure: IRRIGATION AND DEBRIDEMENT FOOT;  Surgeon: Candelaria StagersPatel, Kevin P, DPM;  Location: [facility];  Service: Podiatry;  Laterality: Left;   Family History No family history on file.  Social History Social History   Tobacco Use   Smoking status: Never   Smokeless tobacco: Never  Vaping Use   Vaping Use: Never used  Substance Use Topics   Alcohol use: Yes    Comment: weekends   Drug use: Never   Allergies Patient has no known allergies.  Review of Systems Review of Systems All other systems are reviewed and are negative for acute change except as noted in the HPI  Physical Exam Vital Signs  I have reviewed the triage vital signs  BP 125/79   Pulse 62   Temp 97.8 F (36.6 C) (Oral)   Resp 18   Ht 5\' 6"  (1.676 m)   Wt 65.8 kg   SpO2 97%   BMI 23.40 kg/m   Physical Exam Vitals reviewed.  Constitutional:      General: He is not in acute distress.    Appearance: He is well-developed. He is not diaphoretic.  HENT:     Head: Normocephalic and atraumatic.     Right Ear: External ear normal.     Left Ear: External ear normal.     Nose: Nose normal.     Mouth/Throat:     Mouth: Mucous membranes are moist.  Eyes:     General: No scleral icterus.    Conjunctiva/sclera: Conjunctivae normal.  Neck:     Trachea: Phonation normal.  Cardiovascular:     Rate and Rhythm: Normal rate and regular rhythm.  Pulmonary:     Effort: Pulmonary effort is normal. No respiratory distress.     Breath sounds: No stridor.  Abdominal:     General: There is no distension.  Musculoskeletal:     Right elbow: Swelling and effusion present. No lacerations. Decreased range of motion. Tenderness present.     Cervical back: Normal range of motion.  Neurological:     Mental Status: He is alert and oriented to person, place, and time.  Psychiatric:         Behavior: Behavior normal.    ED Results and Treatments Labs (all labs ordered are listed, but only abnormal results are displayed) Labs Reviewed  CBC WITH DIFFERENTIAL/PLATELET - Abnormal; Notable for the following components:      Result Value   Platelets 605 (*)    All other components within normal limits  BASIC METABOLIC PANEL - Abnormal; Notable for the following components:   Potassium 3.2 (*)    Glucose, Bld 102 (*)    All other components within normal limits  SYNOVIAL CELL COUNT + DIFF, W/ CRYSTALS - Abnormal; Notable for the following components:   Color, Synovial RED (*)    Appearance-Synovial TURBID (*)    WBC, Synovial 7,070 (*)    Neutrophil, Synovial 55 (*)    Monocyte-Macrophage-Synovial Fluid 39 (*)    All other components within normal limits  BODY FLUID CULTURE W GRAM STAIN  SEDIMENTATION RATE  C-REACTIVE PROTEIN                                                                                                                         EKG  EKG Interpretation  Date/Time:    Ventricular Rate:    PR Interval:    QRS Duration:   QT Interval:    QTC Calculation:   R Axis:     Text Interpretation:          Radiology DG Elbow Complete Right  Result Date: 02/21/2021 CLINICAL DATA:  Elbow pain, no acute injury, initial encounter EXAM: RIGHT ELBOW - COMPLETE  3+ VIEW COMPARISON:  02/09/2021 FINDINGS: Previously seen mid humeral shaft fracture is noted with healing. No fracture is noted about the elbow joint. Elevation of the anterior fat pad is seen consistent with joint effusion. Some calcifications and adjacent to the joint are noted suspicious for crystal related arthropathy stable from the prior exam. This is most notable along the lateral aspect of the elbow joint. No new focal abnormality is seen. IMPRESSION: Overall stable appearance of the joint with joint effusion and findings suspicious for crystal deposition disease. Healed midshaft humeral  fracture. Electronically Signed   By: Alcide Clever M.D.   On: 02/21/2021 23:08    Pertinent labs & imaging results that were available during my care of the patient were reviewed by me and considered in my medical decision making (see chart for details).  Medications Ordered in ED Medications  ketorolac (TORADOL) 15 MG/ML injection 15 mg (has no administration in time range)  lidocaine (PF) (XYLOCAINE) 1 % injection 5 mL (5 mLs Intradermal Given by Other 02/22/21 0039)  HYDROcodone-acetaminophen (NORCO/VICODIN) 5-325 MG per tablet 1 tablet (1 tablet Oral Given 02/22/21 0039)  colchicine tablet 0.3 mg (0.3 mg Oral Given 02/22/21 0040)  morphine 4 MG/ML injection 4 mg (4 mg Intravenous Given 02/22/21 0334)                                                                                                                                    Procedures .Joint Aspiration/Arthrocentesis  Date/Time: 02/22/2021 5:19 AM Performed by: Nira Conn, MD Authorized by: Nira Conn, MD   Consent:    Consent obtained:  Verbal   Consent given by:  Patient   Risks, benefits, and alternatives were discussed: yes     Risks discussed:  Bleeding, infection, pain, nerve damage and incomplete drainage   Alternatives discussed:  Delayed treatment Universal protocol:    Procedure explained and questions answered to patient or proxy's satisfaction: yes     Patient identity confirmed:  Verbally with patient and arm band Location:    Location:  Elbow   Elbow:  R elbow Anesthesia:    Anesthesia method:  Local infiltration   Local anesthetic:  Lidocaine 1% w/o epi Procedure details:    Preparation: Patient was prepped and draped in usual sterile fashion     Needle gauge:  18 G   Approach:  Posterior   Aspirate amount:  1 cc   Aspirate characteristics:  Blood-tinged and clear Post-procedure details:    Dressing:  Adhesive bandage   Procedure completion:  Tolerated  (including critical care  time)  Medical Decision Making / ED Course I have reviewed the nursing notes for this encounter and the patient's prior records (if available in EHR or on provided paperwork).   Willie Collins was evaluated in Emergency Department on 02/22/2021 for the symptoms described in the history of present illness. He was evaluated in the context of the  global COVID-19 pandemic, which necessitated consideration that the patient might be at risk for infection with the SARS-CoV-2 virus that causes COVID-19. Institutional protocols and algorithms that pertain to the evaluation of patients at risk for COVID-19 are in a state of rapid change based on information released by regulatory bodies including the CDC and federal and state organizations. These policies and algorithms were followed during the patient's care in the ED.  Right elbow pain and effusion. Gout versus septic arthritis. CBC without leukocytosis or anemia No significant electrolyte derangements or renal sufficiency Arthrocentesis and synovial fluid consistent with gout. Treated symptomatically.      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 Safe for discharge with strict return precautions.  Disposition: Discharge  Condition: Good  I have discussed the results, Dx and Tx plan with the patient/family who expressed understanding and agree(s) with the plan. Discharge instructions discussed at length. The patient/family was given strict return precautions who verbalized understanding of the instructions. No further questions at time of discharge.    ED Discharge Orders          Ordered    indomethacin (INDOCIN) 50 MG capsule  3 times daily with meals         02/22/21 0516              Follow Up: Primary care provider  Call  if you have not been called about your appointment     This chart was dictated using voice recognition software.  Despite best efforts to proofread,  errors can occur which can change the documentation meaning.    Nira Conn, MD 02/22/21 [phone]

## 2021-02-25 LAB — BODY FLUID CULTURE W GRAM STAIN
Culture: NO GROWTH
Special Requests: NORMAL

## 2021-05-03 ENCOUNTER — Other Ambulatory Visit: Payer: Self-pay

## 2021-05-03 ENCOUNTER — Encounter (HOSPITAL_COMMUNITY): Payer: Self-pay

## 2021-05-03 ENCOUNTER — Emergency Department (HOSPITAL_COMMUNITY)
Admission: EM | Admit: 2021-05-03 | Discharge: 2021-05-03 | Disposition: A | Discharge status: Home / Self Care | Payer: Medicaid Other | Attending: Emergency Medicine | Admitting: Emergency Medicine

## 2021-05-03 DIAGNOSIS — R000 Tachycardia, unspecified: Secondary | ICD-10-CM | POA: Insufficient documentation

## 2021-05-03 DIAGNOSIS — M109 Gout, unspecified: Secondary | ICD-10-CM

## 2021-05-03 DIAGNOSIS — I10 Essential (primary) hypertension: Secondary | ICD-10-CM | POA: Insufficient documentation

## 2021-05-03 DIAGNOSIS — Z79899 Other long term (current) drug therapy: Secondary | ICD-10-CM | POA: Insufficient documentation

## 2021-05-03 MED ORDER — INDOMETHACIN 25 MG PO CAPS: 25.0000 mg | ORAL_CAPSULE | Freq: Three times a day (TID) | ORAL | 0 refills | Status: DC | PRN

## 2021-05-03 MED ORDER — ONDANSETRON HCL 4 MG/2ML IJ SOLN
4.0000 mg | Freq: Once | INTRAMUSCULAR | Status: AC
  Administered 2021-05-03: 4 mg via INTRAVENOUS
  Filled 2021-05-03: qty 2

## 2021-05-03 MED ORDER — HYDROMORPHONE HCL 1 MG/ML IJ SOLN
1.0000 mg | Freq: Once | INTRAMUSCULAR | Status: AC
  Administered 2021-05-03: 1 mg via INTRAMUSCULAR
  Filled 2021-05-03: qty 1

## 2021-05-03 MED ORDER — PREDNISONE 20 MG PO TABS: ORAL_TABLET | ORAL | 0 refills | Status: DC

## 2021-05-03 MED ORDER — PREDNISONE 20 MG PO TABS
60.0000 mg | ORAL_TABLET | Freq: Once | ORAL | Status: AC
  Administered 2021-05-03: 60 mg via ORAL
  Filled 2021-05-03: qty 3

## 2021-05-03 MED ORDER — ONDANSETRON 4 MG PO TBDP
4.0000 mg | ORAL_TABLET | Freq: Once | ORAL | Status: AC
  Administered 2021-05-03: 4 mg via ORAL
  Filled 2021-05-03: qty 1

## 2021-05-03 MED ORDER — COLCHICINE 0.6 MG PO TABS: 0.6000 mg | ORAL_TABLET | Freq: Every day | ORAL | 0 refills | Status: DC

## 2021-05-03 NOTE — ED Provider Notes (Addendum)
[facility]-EMERGENCY DEPT Provider Note   CSN: [id number] Arrival date & time: 05/03/21  1111     History Chief Complaint  Patient presents with   Joint Swelling    Willie Collins is a 60 y.o. male.  The history is provided by the patient and medical records. No language interpreter was used.   60 year old male significant history of hypertension, gout, who presents for evaluation of right elbow pain.  Patient report for the past 2 days he has been experiencing gradual onset of progressive worsening sharp throbbing pain about his right elbow.  Pain is now moderate to severe, persistent, worse with any movement.  No associated fever or chills no numbness.  No recent injury.  Denies perform any repetitive movement.  He did try taking indomethacin at home with minimal relief.  He does have history of gout.  Was last seen 2 months ago for same.  Past Medical History:  Diagnosis Date   Hypertension     Patient Active Problem List   Diagnosis Date Noted   Foot abscess, left 11/24/2020   Anxiety 11/24/2020   Hyperkalemia 11/24/2020    Past Surgical History:  Procedure Laterality Date   IRRIGATION AND DEBRIDEMENT FOOT Left 11/25/2020   Procedure: IRRIGATION AND DEBRIDEMENT FOOT;  Surgeon: Candelaria Stagers, DPM;  Location: [facility];  Service: Podiatry;  Laterality: Left;       History reviewed. No pertinent family history.  Social History   Tobacco Use   Smoking status: Never   Smokeless tobacco: Never  Vaping Use   Vaping Use: Never used  Substance Use Topics   Alcohol use: Yes    Comment: weekends   Drug use: Never    Home Medications Prior to Admission medications   Medication Sig Start Date End Date Taking? Authorizing Provider  amLODipine (NORVASC) 5 MG tablet Take 1 tablet (5 mg total) by mouth daily. 11/29/20   Narda Bonds, MD  morphine (MSIR) 15 MG tablet Take 0.5 tablets (7.5 mg total) by mouth every 4 (four) hours as needed for  severe pain. 02/10/21   Melene Plan, DO  oxyCODONE-acetaminophen (PERCOCET) 10-325 MG tablet Take 1 tablet by mouth every 4 (four) hours as needed for pain. 12/04/20   Candelaria Stagers, DPM  oxyCODONE-acetaminophen (PERCOCET) 5-325 MG tablet Take 1-2 tablets by mouth every 4 (four) hours as needed for severe pain. 11/25/20   Candelaria Stagers, DPM  predniSONE (DELTASONE) 20 MG tablet 2 tabs po daily x 4 days 02/10/21   Melene Plan, DO    Allergies    Patient has no known allergies.  Review of Systems   Review of Systems  Constitutional:  Negative for fever.  Musculoskeletal:  Positive for arthralgias.  Skin:  Negative for wound.   Physical Exam Updated Vital Signs BP (!) 133/94 (BP Location: Left Arm)   Pulse (!) 123   Temp 98.7 F (37.1 C) (Oral)   Resp 20   SpO2 95%   Physical Exam Vitals and nursing note reviewed.  Constitutional:      Appearance: He is well-developed.     Comments: Patient is laying in the chair appears to be very uncomfortable and actively crying.  HENT:     Head: Atraumatic.  Eyes:     Conjunctiva/sclera: Conjunctivae normal.  Cardiovascular:     Rate and Rhythm: Tachycardia present.     Pulses: Normal pulses.     Heart sounds: Normal heart sounds.  Pulmonary:     Effort:  Pulmonary effort is normal.     Breath sounds: Normal breath sounds.  Abdominal:     Palpations: Abdomen is soft.  Musculoskeletal:        General: Tenderness (Right elbow: Erythema edema and warmth appreciated to the posterior right elbow with decreased elbow range of motion.  Right shoulder and right wrist nontender radial pulse 2+.) present.     Cervical back: Neck supple.  Skin:    Findings: No rash.  Neurological:     Mental Status: He is alert. Mental status is at baseline.    ED Results / Procedures / Treatments   Labs (all labs ordered are listed, but only abnormal results are displayed) Labs Reviewed - No data to display  EKG None  Radiology No results  found.  Procedures Procedures   Medications Ordered in ED Medications  HYDROmorphone (DILAUDID) injection 1 mg (1 mg Intramuscular Given 05/03/21 1538)  predniSONE (DELTASONE) tablet 60 mg (60 mg Oral Given 05/03/21 1538)    ED Course  I have reviewed the triage vital signs and the nursing notes.  Pertinent labs & imaging results that were available during my care of the patient were reviewed by me and considered in my medical decision making (see chart for details).    MDM Rules/Calculators/A&P                           BP 124/83   Pulse 86   Temp 98.7 F (37.1 C) (Oral)   Resp 20   SpO2 94%   Final Clinical Impression(s) / ED Diagnoses Final diagnoses:  Acute gout of right elbow, unspecified cause    Rx / DC Orders ED Discharge Orders     None      3:20 PM Patient here with atraumatic right elbow pain ongoing for the past 2 days.  Exam reveals a swelling from an exquisitely tender right elbow with decreased range of motion.  Patient is tachycardic but afebrile.  Tachycardia likely secondary to ongoing pain.  He appears very uncomfortable.He was last seen in the ED 2 months ago for similar presentation.  At that time, previous provider did work-up for gout versus septic arthritis.  The elbow was tapped and synovial fluid consistent with gout.  I suspect this is likely a gout flare and less likely to be a septic arthritis, or cellulitis.  No trauma doubt fracture or dislocation.  Will provide symptomatic treatment.  4:28 PM Patient did report improvement of his symptoms after receiving pain medication.  Patient sent home with medication to treat for gout.  Recommend dietary changes to decrease risk of recurrence of gout.  Return precaution given.   5:37 PM As patient was about to be discharged, he became very nauseous and dizzy upon standing up.  He proceeds to vomit and became very tremulous.  I suspect this is likely secondary to recent Dilaudid pain medication  reaction.  Will monitor closely.   Symptoms improved patient stable for discharge.   Fayrene Helper, PA-C 05/03/21 Serena Croissant    Mancel Bale, MD 05/03/21 [phone]

## 2021-05-03 NOTE — Discharge Instructions (Addendum)
You have been diagnosed with gout of the right elbow.  Please take medication as prescribed.  Monitor your diet and avoid red meat, beer, seafood as it can worsen your symptoms.

## 2021-05-03 NOTE — ED Triage Notes (Signed)
Pt arrived via POV, c/o right elbow swelling, states hx of gout, has started taking meds but pain worsening.

## 2021-06-01 ENCOUNTER — Emergency Department (HOSPITAL_COMMUNITY)
Admission: EM | Admit: 2021-06-01 | Discharge: 2021-06-01 | Disposition: A | Discharge status: Home / Self Care | Payer: Self-pay | Attending: Emergency Medicine | Admitting: Emergency Medicine

## 2021-06-01 ENCOUNTER — Other Ambulatory Visit: Payer: Self-pay

## 2021-06-01 ENCOUNTER — Emergency Department (HOSPITAL_COMMUNITY): Payer: Self-pay

## 2021-06-01 ENCOUNTER — Encounter (HOSPITAL_COMMUNITY): Payer: Self-pay | Admitting: Emergency Medicine

## 2021-06-01 ENCOUNTER — Emergency Department (HOSPITAL_BASED_OUTPATIENT_CLINIC_OR_DEPARTMENT_OTHER): Payer: Self-pay

## 2021-06-01 DIAGNOSIS — M79605 Pain in left leg: Secondary | ICD-10-CM

## 2021-06-01 DIAGNOSIS — I10 Essential (primary) hypertension: Secondary | ICD-10-CM | POA: Insufficient documentation

## 2021-06-01 DIAGNOSIS — R52 Pain, unspecified: Secondary | ICD-10-CM

## 2021-06-01 DIAGNOSIS — M79662 Pain in left lower leg: Secondary | ICD-10-CM | POA: Insufficient documentation

## 2021-06-01 DIAGNOSIS — Z79899 Other long term (current) drug therapy: Secondary | ICD-10-CM | POA: Insufficient documentation

## 2021-06-01 MED ORDER — OXYCODONE-ACETAMINOPHEN 5-325 MG PO TABS
1.0000 | ORAL_TABLET | Freq: Once | ORAL | Status: AC
  Administered 2021-06-01: 1 via ORAL
  Filled 2021-06-01: qty 1

## 2021-06-01 MED ORDER — METHOCARBAMOL 500 MG PO TABS
500.0000 mg | ORAL_TABLET | Freq: Once | ORAL | Status: AC
  Administered 2021-06-01: 500 mg via ORAL
  Filled 2021-06-01: qty 1

## 2021-06-01 MED ORDER — METHOCARBAMOL 500 MG PO TABS: 500.0000 mg | ORAL_TABLET | Freq: Three times a day (TID) | ORAL | 0 refills | Status: DC | PRN

## 2021-06-01 MED ORDER — IBUPROFEN 800 MG PO TABS
800.0000 mg | ORAL_TABLET | Freq: Once | ORAL | Status: AC
  Administered 2021-06-01: 800 mg via ORAL
  Filled 2021-06-01: qty 1

## 2021-06-01 NOTE — ED Notes (Signed)
Pt reports that his daughter is coming to get him.

## 2021-06-01 NOTE — Progress Notes (Signed)
Lower extremity venous has been completed.   Preliminary results in CV Proc.   Willie Collins Willie Collins 06/01/2021 3:22 PM

## 2021-06-01 NOTE — Discharge Instructions (Addendum)
You came to the emergency department today to be evaluated for your leg pain.  Your x-ray showed no broken bones or dislocations.  The ultrasound showed no blood clots in your leg.  Your pain is likely musculoskeletal in nature.  While in the emergency department you were noted to have a high blood pressure.  Please schedule an appointment with [facility] or your primary care provider for reevaluation of your blood pressure.  Today you were prescribed Methocarbamol (Robaxin).  Methocarbamol (Robaxin) is used to treat muscle spasms/pain.  It works by helping to relax the muscles.  Drowsiness, dizziness, lightheadedness, stomach upset, nausea/vomiting, or blurred vision may occur.  Do not drive, use machinery, or do anything that needs alertness or clear vision until you can do it safely.  Do not combine this medication with alcoholic beverages, marijuana, or other central nervous system depressants.    Today you received medications that may make you sleepy or impair your ability to make decisions.  For the next 24 hours please do not drive, operate heavy machinery, care for a small child with out another adult present, or perform any activities that may cause harm to you or someone else if you were to fall asleep or be impaired.   You are being prescribed a medication which may make you sleepy. Please follow up of listed precautions for at least 24 hours after taking one dose.   Get help right away if: Your pain gets worse. Your skin or toenails turn blue or gray, feel cold, or become numb. You develop swelling, or redness in your leg.

## 2021-06-01 NOTE — ED Provider Notes (Signed)
[facility]-EMERGENCY DEPT Provider Note   CSN: [id number] Arrival date & time: 06/01/21  1339     History Chief Complaint  Patient presents with   Leg Pain    Hudsyn Diaz-Rivera is a 60 y.o. male with a history of hypertension.  Presents to the emergency department with a chief complaint of left leg pain.  Patient reports that pain started while he is at work 05/30/21.  Patient reports pain has been constant since then.  Pain is located to left lower leg and radiates up to left hip.  Patient describes pain as a "throbbing."  Patient rates pain 10/10 on the pain scale.  Pain is worse with touch or movement.  Patient has had no relief with ibuprofen.  Patient denies any recent falls or injuries.  No recent heavy lifting.  Patient denies any fevers, chills, numbness, weakness, rash, wounds, swelling, color change, saddle anesthesia, bowel or bladder dysfunction, swelling or tenderness to genitals.  Patient reports that describes his feeling restless into admission.  Patient is reports he is on his feet for prolonged period of time but denies any heavy lifting.      Leg Pain Associated symptoms: no back pain, no fever and no neck pain       Past Medical History:  Diagnosis Date   Hypertension     Patient Active Problem List   Diagnosis Date Noted   Foot abscess, left 11/24/2020   Anxiety 11/24/2020   Hyperkalemia 11/24/2020    Past Surgical History:  Procedure Laterality Date   IRRIGATION AND DEBRIDEMENT FOOT Left 11/25/2020   Procedure: IRRIGATION AND DEBRIDEMENT FOOT;  Surgeon: Candelaria Stagers, DPM;  Location: [facility];  Service: Podiatry;  Laterality: Left;       No family history on file.  Social History   Tobacco Use   Smoking status: Never   Smokeless tobacco: Never  Vaping Use   Vaping Use: Never used  Substance Use Topics   Alcohol use: Yes    Comment: weekends   Drug use: Never    Home Medications Prior to Admission medications    Medication Sig Start Date End Date Taking? Authorizing Provider  amLODipine (NORVASC) 5 MG tablet Take 1 tablet (5 mg total) by mouth daily. 11/29/20   Narda Bonds, MD  colchicine 0.6 MG tablet Take 1 tablet (0.6 mg total) by mouth daily. 05/03/21   Fayrene Helper, PA-C  indomethacin (INDOCIN) 25 MG capsule Take 1 capsule (25 mg total) by mouth 3 (three) times daily as needed. 05/03/21   Fayrene Helper, PA-C  morphine (MSIR) 15 MG tablet Take 0.5 tablets (7.5 mg total) by mouth every 4 (four) hours as needed for severe pain. 02/10/21   Melene Plan, DO  predniSONE (DELTASONE) 20 MG tablet 2 tabs po daily x 4 days 05/03/21   Fayrene Helper, PA-C    Allergies    Patient has no known allergies.  Review of Systems   Review of Systems  Constitutional:  Negative for chills and fever.  Eyes:  Negative for visual disturbance.  Respiratory:  Negative for shortness of breath.   Cardiovascular:  Negative for chest pain.  Genitourinary:  Negative for difficulty urinating, penile pain, penile swelling, scrotal swelling and testicular pain.  Musculoskeletal:  Positive for myalgias. Negative for back pain and neck pain.  Skin:  Negative for color change, pallor, rash and wound.  Allergic/Immunologic: Negative for immunocompromised state.  Neurological:  Negative for weakness and numbness.  Psychiatric/Behavioral:  Negative for  confusion.    Physical Exam Updated Vital Signs BP (!) 152/105   Pulse 96   Temp 98.9 F (37.2 C) (Oral)   Resp 16   SpO2 97%   Physical Exam Vitals and nursing note reviewed.  Constitutional:      General: He is not in acute distress.    Appearance: He is not ill-appearing, toxic-appearing or diaphoretic.  HENT:     Head: Normocephalic.  Eyes:     General: No scleral icterus.       Right eye: No discharge.        Left eye: No discharge.  Cardiovascular:     Rate and Rhythm: Normal rate.     Pulses:          Dorsalis pedis pulses are 2+ on the right side and 2+ on the  left side.  Pulmonary:     Effort: Pulmonary effort is normal.  Musculoskeletal:     Right hip: No deformity, lacerations, tenderness, bony tenderness or crepitus. Normal range of motion. Normal strength.     Left hip: Tenderness and bony tenderness present. No deformity, lacerations or crepitus. Decreased range of motion (2ndary to complaints of pain). Normal strength.     Right upper leg: Normal.     Left upper leg: Tenderness and bony tenderness present. No swelling, edema, deformity or lacerations.     Right knee: No swelling, deformity, effusion, erythema, ecchymosis, lacerations, bony tenderness or crepitus. No tenderness. Normal alignment.     Left knee: No swelling, deformity, effusion, erythema, ecchymosis, lacerations, bony tenderness or crepitus. Normal range of motion. No tenderness. Normal alignment.     Right lower leg: Normal.     Left lower leg: Tenderness and bony tenderness present. No swelling, deformity or lacerations. No edema.     Right ankle: No swelling, deformity, ecchymosis or lacerations. No tenderness. Normal range of motion.     Left ankle: No swelling, deformity, ecchymosis or lacerations. No tenderness. Normal range of motion.     Right foot: Normal range of motion and normal capillary refill. No swelling, deformity, laceration, tenderness, bony tenderness or crepitus. Normal pulse.     Left foot: Normal range of motion and normal capillary refill. No swelling, deformity, laceration, tenderness, bony tenderness or crepitus. Normal pulse.     Comments: No midline tenderness or deformity to cervical, thoracic, lumbar spine.  Feet:     Right foot:     Skin integrity: Skin integrity normal.     Toenail Condition: Right toenails are normal.     Left foot:     Skin integrity: Skin integrity normal.     Toenail Condition: Left toenails are normal.  Skin:    General: Skin is warm and dry.  Neurological:     General: No focal deficit present.     Mental Status: He is  alert.  Psychiatric:        Behavior: Behavior is cooperative.    ED Results / Procedures / Treatments   Labs (all labs ordered are listed, but only abnormal results are displayed) Labs Reviewed - No data to display  EKG None  Radiology No results found.  Procedures Procedures   Medications Ordered in ED Medications - No data to display  ED Course  I have reviewed the triage vital signs and the nursing notes.  Pertinent labs & imaging results that were available during my care of the patient were reviewed by me and considered in my medical decision making (see chart for  details).    MDM Rules/Calculators/A&P                           Alert 60 year old male in no acute distress, nontoxic-appearing.  Presents emergency department with a chief complaint of left leg pain.  Patient has pulse, motor, and sensation intact distally; low suspicion for arterial occlusion at this time.  No signs of erythema, warmth, or swelling; low suspicion for infection at this time.  We will give patient Percocet for pain management.  Will obtain x-ray imaging to evaluate for possible fracture or dislocation.  Will obtain ultrasound imaging to evaluate for possible DVT.  X-ray imaging shows no acute fracture or dislocation. Ultrasound imaging shows no signs of DVT.    Will prescribe patient with Robaxin.  Patient vies use over-the-counter pain medication as needed.  While in emergency room patient was noted to be hypertensive.  Patient has history of hypertension.  Reports he has been noncompliant with his medication.  We will have patient follow-up with [facility] for further blood pressure management.  Discussed results, findings, treatment and follow up. Patient advised of return precautions. Patient verbalized understanding and agreed with plan.   Final Clinical Impression(s) / ED Diagnoses Final diagnoses:  Left leg pain    Rx / DC Orders ED Discharge Orders           Ordered    methocarbamol (ROBAXIN) 500 MG tablet  Every 8 hours PRN        06/01/21 1547             Haskel Schroeder, PA-C 06/01/21 1600    Bethann Berkshire, MD 06/02/21 1651

## 2021-06-01 NOTE — ED Triage Notes (Signed)
PT c/o L lower leg pain radiating up leg to L lower back x2 days. Pain worsens with movement. Denies injury.

## 2021-06-23 ENCOUNTER — Other Ambulatory Visit: Payer: Self-pay

## 2021-06-23 ENCOUNTER — Emergency Department (HOSPITAL_COMMUNITY)
Admission: EM | Admit: 2021-06-23 | Discharge: 2021-06-23 | Disposition: A | Discharge status: Home / Self Care | Payer: Medicaid Other | Attending: Emergency Medicine | Admitting: Emergency Medicine

## 2021-06-23 ENCOUNTER — Encounter (HOSPITAL_COMMUNITY): Payer: Self-pay

## 2021-06-23 DIAGNOSIS — I10 Essential (primary) hypertension: Secondary | ICD-10-CM | POA: Insufficient documentation

## 2021-06-23 DIAGNOSIS — M79605 Pain in left leg: Secondary | ICD-10-CM | POA: Insufficient documentation

## 2021-06-23 DIAGNOSIS — Z79899 Other long term (current) drug therapy: Secondary | ICD-10-CM | POA: Insufficient documentation

## 2021-06-23 DIAGNOSIS — M25552 Pain in left hip: Secondary | ICD-10-CM | POA: Insufficient documentation

## 2021-06-23 DIAGNOSIS — M25562 Pain in left knee: Secondary | ICD-10-CM | POA: Insufficient documentation

## 2021-06-23 MED ORDER — METHOCARBAMOL 500 MG PO TABS: 500.0000 mg | ORAL_TABLET | Freq: Three times a day (TID) | ORAL | 0 refills | Status: DC | PRN

## 2021-06-23 MED ORDER — PREDNISONE 20 MG PO TABS: 40.0000 mg | ORAL_TABLET | Freq: Every day | ORAL | 0 refills | Status: AC

## 2021-06-23 MED ORDER — KETOROLAC TROMETHAMINE 30 MG/ML IJ SOLN
30.0000 mg | Freq: Once | INTRAMUSCULAR | Status: AC
  Administered 2021-06-23: 30 mg via INTRAVENOUS
  Filled 2021-06-23: qty 1

## 2021-06-23 MED ORDER — DEXAMETHASONE SODIUM PHOSPHATE 10 MG/ML IJ SOLN
10.0000 mg | Freq: Once | INTRAMUSCULAR | Status: AC
  Administered 2021-06-23: 10 mg via INTRAVENOUS
  Filled 2021-06-23: qty 1

## 2021-06-23 MED ORDER — HYDROMORPHONE HCL 1 MG/ML IJ SOLN
1.0000 mg | Freq: Once | INTRAMUSCULAR | Status: AC
  Administered 2021-06-23: 1 mg via INTRAMUSCULAR
  Filled 2021-06-23: qty 1

## 2021-06-23 NOTE — ED Provider Notes (Signed)
[facility]-EMERGENCY DEPT Provider Note   CSN: [id number] Arrival date & time: 06/23/21  5176     History Chief Complaint  Patient presents with   Leg Pain    Willie Collins is a 60 y.o. male.  With past medical history of anxiety, hypertension who presents emergency department for left leg pain.  States that he had 10/10, constant, throbbing and sharp left leg pain that he states begins in his left lower leg and moves to his left thigh and left buttock.  Denies aggravating or alleviating factors.  States that he has been using Tylenol and ibuprofen as well as his prescribed Robaxin without relief of pain.  He is quite tearful on exam and appears to be in excruciating pain.  He denies any falls or trauma to the leg.  He denies any fevers, swelling of the leg or joints.  He has a history of I&D of the left foot in March 2022 for an abscess without complication.  Of note he was seen on 06/01/2021 for similar complaint.  At that time they obtained ultrasound DVT study which was negative, x-ray of the pelvis, femur, tib-fib which was all negative.  He was given Robaxin to take outpatient which he says he has been taking, but has run out.  He said that it was minimally helpful.     Leg Pain Associated symptoms: no fever       Past Medical History:  Diagnosis Date   Hypertension     Patient Active Problem List   Diagnosis Date Noted   Foot abscess, left 11/24/2020   Anxiety 11/24/2020   Hyperkalemia 11/24/2020    Past Surgical History:  Procedure Laterality Date   IRRIGATION AND DEBRIDEMENT FOOT Left 11/25/2020   Procedure: IRRIGATION AND DEBRIDEMENT FOOT;  Surgeon: Candelaria Stagers, DPM;  Location: [facility];  Service: Podiatry;  Laterality: Left;       No family history on file.  Social History   Tobacco Use   Smoking status: Never   Smokeless tobacco: Never  Vaping Use   Vaping Use: Never used  Substance Use Topics   Alcohol use: Yes     Comment: weekends   Drug use: Never    Home Medications Prior to Admission medications   Medication Sig Start Date End Date Taking? Authorizing Provider  amLODipine (NORVASC) 5 MG tablet Take 1 tablet (5 mg total) by mouth daily. 11/29/20   Narda Bonds, MD  colchicine 0.6 MG tablet Take 1 tablet (0.6 mg total) by mouth daily. 05/03/21   Fayrene Helper, PA-C  indomethacin (INDOCIN) 25 MG capsule Take 1 capsule (25 mg total) by mouth 3 (three) times daily as needed. 05/03/21   Fayrene Helper, PA-C  methocarbamol (ROBAXIN) 500 MG tablet Take 1 tablet (500 mg total) by mouth every 8 (eight) hours as needed for muscle spasms. 06/01/21   Haskel Schroeder, PA-C  morphine (MSIR) 15 MG tablet Take 0.5 tablets (7.5 mg total) by mouth every 4 (four) hours as needed for severe pain. 02/10/21   Melene Plan, DO  predniSONE (DELTASONE) 20 MG tablet 2 tabs po daily x 4 days 05/03/21   Fayrene Helper, PA-C    Allergies    Patient has no known allergies.  Review of Systems   Review of Systems  Constitutional:  Negative for fever.  Musculoskeletal:  Positive for gait problem and myalgias.  Skin:  Negative for color change, pallor, rash and wound.  All other systems reviewed and are  negative.  Physical Exam Updated Vital Signs BP (!) 161/104 (BP Location: Left Arm)   Pulse 88   Temp 98.1 F (36.7 C) (Oral)   Resp 18   Ht 5\' 6"  (1.676 m)   Wt 61.7 kg   SpO2 96%   BMI 21.95 kg/m   Physical Exam Vitals and nursing note reviewed.  Constitutional:      Appearance: Normal appearance. He is normal weight. He is not toxic-appearing or diaphoretic.  HENT:     Head: Normocephalic and atraumatic.  Eyes:     General: No scleral icterus.    Pupils: Pupils are equal, round, and reactive to light.  Cardiovascular:     Pulses: Normal pulses.  Pulmonary:     Effort: Pulmonary effort is normal. No respiratory distress.  Abdominal:     Palpations: Abdomen is soft.     Tenderness: There is no abdominal  tenderness.  Musculoskeletal:        General: Tenderness present. No swelling or deformity.     Cervical back: Normal range of motion and neck supple. No tenderness.     Right hip: Normal.     Left hip: Tenderness present.     Right upper leg: Normal.     Left upper leg: Tenderness and bony tenderness present. No swelling, edema or deformity.     Right knee: Normal.     Left knee: Bony tenderness present. No swelling, deformity, effusion or erythema. Decreased range of motion. Tenderness present. Normal pulse.     Right lower leg: No swelling. No edema.     Left lower leg: Tenderness and bony tenderness present. No swelling. No edema.     Comments: + Straight leg on the left Limping gait Exquisite tenderness to light palpation of the left hip, buttock, anterior and posterior thigh, calf/shin  Skin:    General: Skin is warm and dry.     Capillary Refill: Capillary refill takes less than 2 seconds.     Findings: No rash.  Neurological:     General: No focal deficit present.     Mental Status: He is alert and oriented to person, place, and time. Mental status is at baseline.  Psychiatric:        Mood and Affect: Mood normal.        Behavior: Behavior normal.        Thought Content: Thought content normal.        Judgment: Judgment normal.    ED Results / Procedures / Treatments   Labs (all labs ordered are listed, but only abnormal results are displayed) Labs Reviewed - No data to display  EKG None  Radiology No results found.  Procedures Procedures   Medications Ordered in ED Medications  dexamethasone (DECADRON) injection 10 mg (10 mg Intravenous Given 06/23/21 0915)  ketorolac (TORADOL) 30 MG/ML injection 30 mg (30 mg Intravenous Given 06/23/21 0916)  HYDROmorphone (DILAUDID) injection 1 mg (1 mg Intramuscular Given 06/23/21 1134)    ED Course  I have reviewed the triage vital signs and the nursing notes.  Pertinent labs & imaging results that were available  during my care of the patient were reviewed by me and considered in my medical decision making (see chart for details).  1202: Pain improving with IM Dilaudid.  Will reassess shortly and dispo. MDM Rules/Calculators/A&P 60 year old male with HPI, PE and work-up as described above. He has had no changes since previous ED presentation for same complaint where he received ultrasound DVT study and plain  films of his leg which were all negative.  His exam is unremarkable. His presentation is consistent with sciatica.  He has positive straight leg raise testing, and pain that radiates up to his buttock which is consistent with sciatica.  Given his previous negative imaging for acute fractures, dislocations, soft tissue swelling do not believe further imaging would be beneficial at this time.  He has no unilateral swelling of the extremity so will not repeat ultrasound DVT study.  He also has no other risk factors for DVT.  He has no fevers, redness or swelling in his joints that would concern me for septic arthritis.  I have given him Decadron to reduce swelling.  Toradol was not beneficial.  Given IM Dilaudid which appears to have decreased his pain.  Patient is alert and oriented, able to ambulate appropriately, eat and drink at time of discharge. I have renewed his prescription for Robaxin and have instructed him to take as prescribed. I am also prescribing a course of steroids to attempt to reduce inflammation of the nerve. Given strict return precautions should he begin to have bilateral lower extremity numbness and tingling, saddle anesthesia, incontinence of bowel or bladder or fevers. He understands return precautions. Discussed f/u with PCP for hypertension. He understands to follow-up for ongoing management.  Final Clinical Impression(s) / ED Diagnoses Final diagnoses:  Left leg pain    Rx / DC Orders ED Discharge Orders          Ordered    predniSONE (DELTASONE) 20 MG tablet  Daily         06/23/21 1237    methocarbamol (ROBAXIN) 500 MG tablet  Every 8 hours PRN        06/23/21 1237             Cristopher Peru, PA-C 06/23/21 1752    Pollyann Savoy, MD 06/24/21 [phone]

## 2021-06-23 NOTE — ED Triage Notes (Signed)
Patient c/o pain from left buttocks to the left ankle.  Patient reports that he was seen earlier and received Robaxin and has finished the prescription without relief. Patient states pain is worse with standing and walking.

## 2021-06-23 NOTE — Discharge Instructions (Signed)
You are seen in the emergency department today for left leg pain.  Given that you have recently had imaging of your leg and it was all negative we did not repeat it while you are here this may be sciatica as her symptoms are consistent with that.  I will discharge you with a course of steroids to reduce the swelling that may be around the nerve.  Additionally continue to take Robaxin which is a muscle relaxant.  Please use this every 8 hours for muscle spasms.  Additionally can take ibuprofen as needed for pain relief.  Return to emergency department if you are unable to walk on the leg, you begin to have swelling of the leg, fevers.  I have given you the information to [facility] which may be able to set you up with a specialist if needed and can continue managing sciatica pain.

## 2021-07-06 ENCOUNTER — Emergency Department (HOSPITAL_COMMUNITY): Payer: Medicaid Other

## 2021-07-06 ENCOUNTER — Emergency Department (HOSPITAL_COMMUNITY)
Admission: EM | Admit: 2021-07-06 | Discharge: 2021-07-07 | Disposition: A | Discharge status: Home / Self Care | Payer: Medicaid Other | Attending: Emergency Medicine | Admitting: Emergency Medicine

## 2021-07-06 ENCOUNTER — Encounter (HOSPITAL_COMMUNITY): Payer: Self-pay | Admitting: Emergency Medicine

## 2021-07-06 ENCOUNTER — Other Ambulatory Visit: Payer: Self-pay

## 2021-07-06 DIAGNOSIS — F32A Depression, unspecified: Secondary | ICD-10-CM | POA: Insufficient documentation

## 2021-07-06 DIAGNOSIS — Z20822 Contact with and (suspected) exposure to covid-19: Secondary | ICD-10-CM | POA: Insufficient documentation

## 2021-07-06 DIAGNOSIS — M545 Low back pain, unspecified: Secondary | ICD-10-CM | POA: Insufficient documentation

## 2021-07-06 DIAGNOSIS — I10 Essential (primary) hypertension: Secondary | ICD-10-CM | POA: Insufficient documentation

## 2021-07-06 DIAGNOSIS — G8929 Other chronic pain: Secondary | ICD-10-CM

## 2021-07-06 DIAGNOSIS — F332 Major depressive disorder, recurrent severe without psychotic features: Secondary | ICD-10-CM

## 2021-07-06 DIAGNOSIS — R45851 Suicidal ideations: Secondary | ICD-10-CM | POA: Insufficient documentation

## 2021-07-06 DIAGNOSIS — Z789 Other specified health status: Secondary | ICD-10-CM

## 2021-07-06 DIAGNOSIS — F1994 Other psychoactive substance use, unspecified with psychoactive substance-induced mood disorder: Secondary | ICD-10-CM

## 2021-07-06 LAB — RAPID URINE DRUG SCREEN, HOSP PERFORMED
Amphetamines: NOT DETECTED
Barbiturates: NOT DETECTED
Benzodiazepines: NOT DETECTED
Cocaine: NOT DETECTED
Opiates: NOT DETECTED
Tetrahydrocannabinol: POSITIVE — AB

## 2021-07-06 LAB — CBC WITH DIFFERENTIAL/PLATELET
Abs Immature Granulocytes: 0.06 10*3/uL (ref 0.00–0.07)
Basophils Absolute: 0 10*3/uL (ref 0.0–0.1)
Basophils Relative: 1 %
Eosinophils Absolute: 0.2 10*3/uL (ref 0.0–0.5)
Eosinophils Relative: 3 %
HCT: 48.5 % (ref 39.0–52.0)
Hemoglobin: 15.7 g/dL (ref 13.0–17.0)
Immature Granulocytes: 1 %
Lymphocytes Relative: 35 %
Lymphs Abs: 2 10*3/uL (ref 0.7–4.0)
MCH: 29.2 pg (ref 26.0–34.0)
MCHC: 32.4 g/dL (ref 30.0–36.0)
MCV: 90.3 fL (ref 80.0–100.0)
Monocytes Absolute: 0.4 10*3/uL (ref 0.1–1.0)
Monocytes Relative: 7 %
Neutro Abs: 3.1 10*3/uL (ref 1.7–7.7)
Neutrophils Relative %: 53 %
Platelets: 307 10*3/uL (ref 150–400)
RBC: 5.37 MIL/uL (ref 4.22–5.81)
RDW: 14.5 % (ref 11.5–15.5)
WBC: 5.7 10*3/uL (ref 4.0–10.5)
nRBC: 0 % (ref 0.0–0.2)

## 2021-07-06 LAB — URINALYSIS, ROUTINE W REFLEX MICROSCOPIC
Bilirubin Urine: NEGATIVE
Glucose, UA: NEGATIVE mg/dL
Hgb urine dipstick: NEGATIVE
Ketones, ur: NEGATIVE mg/dL
Leukocytes,Ua: NEGATIVE
Nitrite: NEGATIVE
Protein, ur: NEGATIVE mg/dL
Specific Gravity, Urine: 1.01 (ref 1.005–1.030)
pH: 5 (ref 5.0–8.0)

## 2021-07-06 LAB — COMPREHENSIVE METABOLIC PANEL
ALT: 20 U/L (ref 0–44)
AST: 21 U/L (ref 15–41)
Albumin: 4 g/dL (ref 3.5–5.0)
Alkaline Phosphatase: 65 U/L (ref 38–126)
Anion gap: 13 (ref 5–15)
BUN: 17 mg/dL (ref 8–23)
CO2: 21 mmol/L — ABNORMAL LOW (ref 22–32)
Calcium: 9 mg/dL (ref 8.9–10.3)
Chloride: 106 mmol/L (ref 98–111)
Creatinine, Ser: 1.07 mg/dL (ref 0.61–1.24)
GFR, Estimated: >60 mL/min (ref >60)
Glucose, Bld: 113 mg/dL — ABNORMAL HIGH (ref 70–99)
Potassium: 4.1 mmol/L (ref 3.5–5.1)
Sodium: 140 mmol/L (ref 135–145)
Total Bilirubin: 0.4 mg/dL (ref 0.3–1.2)
Total Protein: 6.8 g/dL (ref 6.5–8.1)

## 2021-07-06 LAB — RESP PANEL BY RT-PCR (FLU A&B, COVID) ARPGX2
Influenza A by PCR: NEGATIVE
Influenza B by PCR: NEGATIVE
SARS Coronavirus 2 by RT PCR: NEGATIVE

## 2021-07-06 LAB — ACETAMINOPHEN LEVEL: Acetaminophen (Tylenol), Serum: <10 ug/mL — ABNORMAL LOW (ref 10–30)

## 2021-07-06 LAB — ETHANOL: Alcohol, Ethyl (B): 283 mg/dL — ABNORMAL HIGH (ref <10)

## 2021-07-06 LAB — SALICYLATE LEVEL: Salicylate Lvl: <7 mg/dL — ABNORMAL LOW (ref 7.0–30.0)

## 2021-07-06 MED ORDER — HYDROMORPHONE HCL 1 MG/ML IJ SOLN
1.0000 mg | Freq: Once | INTRAMUSCULAR | Status: AC
  Administered 2021-07-06: 1 mg via INTRAVENOUS
  Filled 2021-07-06: qty 1

## 2021-07-06 MED ORDER — KETOROLAC TROMETHAMINE 30 MG/ML IJ SOLN
INTRAMUSCULAR | Status: AC
  Filled 2021-07-06: qty 2

## 2021-07-06 MED ORDER — LIDOCAINE 5 % EX PTCH
1.0000 | MEDICATED_PATCH | CUTANEOUS | Status: DC
  Administered 2021-07-06: 1 via TRANSDERMAL
  Filled 2021-07-06: qty 1

## 2021-07-06 MED ORDER — KETOROLAC TROMETHAMINE 15 MG/ML IJ SOLN
15.0000 mg | Freq: Once | INTRAMUSCULAR | Status: DC
  Filled 2021-07-06: qty 1

## 2021-07-06 MED ORDER — KETOROLAC TROMETHAMINE 60 MG/2ML IM SOLN
60.0000 mg | Freq: Once | INTRAMUSCULAR | Status: AC
  Administered 2021-07-06: 60 mg via INTRAMUSCULAR

## 2021-07-06 MED ORDER — METHOCARBAMOL 500 MG PO TABS
500.0000 mg | ORAL_TABLET | Freq: Once | ORAL | Status: AC
  Administered 2021-07-06: 500 mg via ORAL
  Filled 2021-07-06: qty 1

## 2021-07-06 NOTE — ED Provider Notes (Signed)
Emergency Medicine Provider Triage Evaluation Note  Willie Collins , a 61 y.o. male  was evaluated in triage.  Pt complains of suicidal ideations.  Patient reports that he has had suicidal ideations over the last week.  Suicidal ideations became worse last night.  Patient has active plan to overdose on his prescribed medications.  Patient states that he has a previous suicide attempt 1 year prior via overdose on pills.  Patient reports that his suicidal thoughts are revolving around his left lumbar back pain.  Patient reports that he has had lumbar back pain over the last 2 to 3 months.  Patient reports he was diagnosed with sciatica.  Patient does endorse night sweats and approximately 20 pound weight loss over the last 2 to 3 months.  Denies any fevers, chills, saddle anesthesia, bowel or bladder dysfunction, previous history of cancer.  Patient denies any illicit drug use.  Patient endorses EtOH use.  Review of Systems  Positive: Suicidal ideations, lumbar back pain, unexpected weight loss, night sweats Negative: Fever, chills, saddle anesthesia, bowel or bladder dysfunction, homicidal ideations, auditory or visual hallucinations.  Physical Exam  BP 122/75   Pulse 80   Temp 98.7 F (37.1 C) (Oral)   Resp 16   SpO2 97%  Gen:   Awake, no distress   Resp:  Normal effort  MSK:   Moves extremities without difficulty  Other:  No midline tenderness or deformity to cervical, thoracic, or lumbar spine.  Patient has tenderness to left lumbar back.  Medical Decision Making  Medically screening exam initiated at 4:30 PM.  Appropriate orders placed.  Willie Collins was informed that the remainder of the evaluation will be completed by another provider, this initial triage assessment does not replace that evaluation, and the importance of remaining in the ED until their evaluation is complete.  Medical clearance labs ordered due to suicidal ideations.  Patient is here voluntarily at this  time, will need to be IVC if he attempts to leave.  Due to patient's chronic back pain over the last 2 to 3 months in conjunction with reports of unexpected weight loss and night sweats will obtain noncontrast CT of lumbar spine to evaluate for possible malignancy.   Haskel Schroeder, PA-C 07/06/21 1633    Jacalyn Lefevre, MD 07/06/21 [phone]

## 2021-07-06 NOTE — ED Triage Notes (Signed)
Pt reports suicidal ideation due to sciatica.  Reports plan to overdose on pills.

## 2021-07-06 NOTE — ED Notes (Signed)
Pt changed into burgundy scrubs.  Security wanded pt and belongings.  All belongings bagged, labeled, and placed in locker #6.

## 2021-07-06 NOTE — ED Notes (Signed)
Remains cooperative and has been thus far in the shift.

## 2021-07-06 NOTE — ED Provider Notes (Signed)
[facility] EMERGENCY DEPARTMENT Provider Note   CSN: [id number] Arrival date & time: 07/06/21  1528     History Chief Complaint  Patient presents with   Suicidal    Willie Collins is a 60 y.o. male.  HPI This is a 60 year old male with history of hypertension and chronic back pain, depression, prior suicide attempts who presents with SI and back pain.  Patient has had chronic low back pain that radiates to the left side and down the left leg for years.  He states that he has previously been treated with physical therapy with mild improvements but pain has been worse over the last several months.  Has been treated with Robaxin, Tylenol, and Motrin recently without significant improvement.  Also has tramadol at home does not relieve the pain.  Describes it as sharp, throbbing, worse in the left side radiating upward towards the back.  Sometimes pain is also in the left shin.  Endorses numbness and tingling in the left leg.  Denies any weakness in the lower extremity but does have difficulty ambulating secondary to the pain. Denies fever, chills, skin changes or other infectious symptoms.  Denies any falls or traumas recently.  Reports SI with a plan to overdose on his tramadol.  SI being driven by his pain.    Past Medical History:  Diagnosis Date   Hypertension     Patient Active Problem List   Diagnosis Date Noted   Foot abscess, left 11/24/2020   Anxiety 11/24/2020   Hyperkalemia 11/24/2020    Past Surgical History:  Procedure Laterality Date   IRRIGATION AND DEBRIDEMENT FOOT Left 11/25/2020   Procedure: IRRIGATION AND DEBRIDEMENT FOOT;  Surgeon: Candelaria Stagers, DPM;  Location: [facility];  Service: Podiatry;  Laterality: Left;       No family history on file.  Social History   Tobacco Use   Smoking status: Never   Smokeless tobacco: Never  Vaping Use   Vaping Use: Never used  Substance Use Topics   Alcohol use: Yes    Comment: weekends    Drug use: Never    Home Medications Prior to Admission medications   Medication Sig Start Date End Date Taking? Authorizing Provider  amLODipine (NORVASC) 5 MG tablet Take 1 tablet (5 mg total) by mouth daily. 11/29/20   Narda Bonds, MD  colchicine 0.6 MG tablet Take 1 tablet (0.6 mg total) by mouth daily. 05/03/21   Fayrene Helper, PA-C  indomethacin (INDOCIN) 25 MG capsule Take 1 capsule (25 mg total) by mouth 3 (three) times daily as needed. 05/03/21   Fayrene Helper, PA-C  methocarbamol (ROBAXIN) 500 MG tablet Take 1 tablet (500 mg total) by mouth every 8 (eight) hours as needed for muscle spasms. 06/23/21   Cristopher Peru, PA-C  morphine (MSIR) 15 MG tablet Take 0.5 tablets (7.5 mg total) by mouth every 4 (four) hours as needed for severe pain. 02/10/21   Melene Plan, DO    Allergies    Patient has no known allergies.  Review of Systems   Review of Systems  Constitutional:  Negative for chills and fever.  HENT:  Negative for ear pain and sore throat.   Eyes:  Negative for pain and visual disturbance.  Respiratory:  Negative for cough and shortness of breath.   Cardiovascular:  Negative for chest pain and palpitations.  Gastrointestinal:  Negative for abdominal pain and vomiting.  Genitourinary:  Negative for dysuria and hematuria.  Musculoskeletal:  Positive for arthralgias  and back pain.  Skin:  Negative for color change and rash.  Neurological:  Negative for seizures and syncope.  All other systems reviewed and are negative.  Physical Exam Updated Vital Signs BP 121/78 (BP Location: Left Arm)   Pulse 76   Temp 98.7 F (37.1 C) (Oral)   Resp 18   SpO2 97%   Physical Exam Vitals and nursing note reviewed.  Constitutional:      Appearance: He is well-developed.  HENT:     Head: Normocephalic and atraumatic.  Eyes:     Conjunctiva/sclera: Conjunctivae normal.  Cardiovascular:     Rate and Rhythm: Normal rate and regular rhythm.     Heart sounds: No murmur  heard. Pulmonary:     Effort: Pulmonary effort is normal. No respiratory distress.     Breath sounds: Normal breath sounds.  Abdominal:     Palpations: Abdomen is soft.     Tenderness: There is no abdominal tenderness.  Musculoskeletal:     Cervical back: Neck supple.     Comments: Tenderness with palpation of the midline L-spine and laterally to the left  on the SI joint.  Tenderness with palpation of the anterior lateral and posterior left thigh.  No bruising.  No gross deformity.  2+ distal pulses in both extremities.  Skin:    General: Skin is warm and dry.  Neurological:     Mental Status: He is alert.     Cranial Nerves: No cranial nerve deficit.     Sensory: Sensory deficit present.     Motor: No weakness.     Coordination: Coordination normal.     Comments: Decree sensation in the left lower extremity.  Antalgic gait.    ED Results / Procedures / Treatments   Labs (all labs ordered are listed, but only abnormal results are displayed) Labs Reviewed  COMPREHENSIVE METABOLIC PANEL - Abnormal; Notable for the following components:      Result Value   CO2 21 (*)    Glucose, Bld 113 (*)    All other components within normal limits  ETHANOL - Abnormal; Notable for the following components:   Alcohol, Ethyl (B) 283 (*)    All other components within normal limits  SALICYLATE LEVEL - Abnormal; Notable for the following components:   Salicylate Lvl <7.0 (*)    All other components within normal limits  ACETAMINOPHEN LEVEL - Abnormal; Notable for the following components:   Acetaminophen (Tylenol), Serum <10 (*)    All other components within normal limits  RAPID URINE DRUG SCREEN, HOSP PERFORMED - Abnormal; Notable for the following components:   Tetrahydrocannabinol POSITIVE (*)    All other components within normal limits  URINALYSIS, ROUTINE W REFLEX MICROSCOPIC - Abnormal; Notable for the following components:   Color, Urine STRAW (*)    All other components within  normal limits  RESP PANEL BY RT-PCR (FLU A&B, COVID) ARPGX2  CBC WITH DIFFERENTIAL/PLATELET    EKG None  Radiology CT Lumbar Spine Wo Contrast  Result Date: 07/06/2021 CLINICAL DATA:  Low back pain, > 6 wks EXAM: CT LUMBAR SPINE WITHOUT CONTRAST TECHNIQUE: Multidetector CT imaging of the lumbar spine was performed without intravenous contrast administration. Multiplanar CT image reconstructions were also generated. COMPARISON:  None available. FINDINGS: Segmentation: 5 lumbar type vertebrae. Alignment: Trace retrolisthesis of L5 on S1. Trace retrolisthesis of L2 on L3. No significant scoliosis. Vertebrae: No acute fracture or focal pathologic process. Normal vertebral body heights. Paraspinal and other soft tissues: Nonacute. Aortic atherosclerosis.  Disc levels: L1-L2: Disc space narrowing with vacuum phenomenon. Minimal broad-based disc bulge. No spinal canal stenosis. L2-L3: Disc space narrowing with vacuum phenomenon. Broad-based posterior disc bulge. This causes bilateral neural foraminal narrowing. Minimal spinal canal stenosis. L3-L4: Eccentric left posterior disc osteophyte complex causing severe stenosis of the left neural foramen. Moderate right neural foraminal stenosis. Slight ligamentum flavum hypertrophy. Mild central canal stenosis. L4-L5: Mild broad-based disc bulge. Minimal left greater than right spinal foraminal stenosis. Patent canal. L5-S1: Prominent disc space narrowing with vacuum phenomenon and adjacent Modic endplate changes. Posterior disc protrusion is well as broad-based disc bulge. Narrowing of both neural foramen. There is slight facet hypertrophy. IMPRESSION: 1. No acute osseous abnormalities of the lumbar spine. 2. Multilevel degenerative disc disease and facet hypertrophy, as described. 3. Multilevel neural foraminal stenosis. Aortic Atherosclerosis (ICD10-I70.0). Electronically Signed   By: Narda Rutherford M.D.   On: 07/06/2021 18:22    Procedures Procedures    Medications Ordered in ED Medications  lidocaine (LIDODERM) 5 % 1 patch (1 patch Transdermal Patch Applied 07/06/21 1926)  methocarbamol (ROBAXIN) tablet 500 mg (500 mg Oral Given 07/06/21 1928)  ketorolac (TORADOL) injection 60 mg (60 mg Intramuscular Given 07/06/21 1936)    ED Course  I have reviewed the triage vital signs and the nursing notes.  Pertinent labs & imaging results that were available during my care of the patient were reviewed by me and considered in my medical decision making (see chart for details).    MDM Rules/Calculators/A&P                          Patient is hemodynamically stable and well-appearing but tearful on exam.  He has tenderness palpation in the midline of the lower L-spine and laterally over the SI joint.  Also has tenderness with palpation of the left thigh anteriorly, laterally, and posteriorly.  No tenderness with palpation of the shin.  Reports decreased sensation and numbness and tingling to the left lower extremity.  Has 2+ distal pulses with full strength and full range of motion.  No joint effusion or skin changes on exam, patient is afebrile, low suspicion for an infectious cause of his symptoms including cellulitis or septic joint.  Also exhibited by normal white blood cell count. no recent trauma and patient had x-ray imaging 1 month ago, low suspicion for any bony injuries or joint dislocation.  CT of the L spine without any bony injury, there is a L5-S1 disc protrusion could likely be contributing to patient's symptoms.  No urinary retention or bowel or bladder incontinence, no saddle anesthesia, no lower extremity weakness, no evidence of cauda equina.  Patient would likely benefit from outpatient MRI in orthopedic or neurosurgery follow-up but at this time is medically stable and is not emergently indicated.  No evidence of ingestion or obvious toxidromes.  Ethanol elevated on arrival 3 hours ago but patient is clinically sober.  Medically  cleared.  Will consult psychiatry for further evaluation given patient's SI with plan.    Final Clinical Impression(s) / ED Diagnoses Final diagnoses:  Suicidal ideation  Chronic left-sided low back pain, unspecified whether sciatica present    Rx / DC Orders ED Discharge Orders     None        Doran Clay, MD 07/06/21 2027    Eber Hong, MD 07/07/21 2041

## 2021-07-06 NOTE — BH Assessment (Signed)
Clinician messaged Dola Argyle. O'Neal, RN: "Hey it's Thurston Pounds with TTS is the pt able to assessed if so he will need to be placed in a private room. Also is he under IVC?"    Clinician awaiting response.    Redmond Pulling, MS, [facility] Triage Specialist [phone]

## 2021-07-07 ENCOUNTER — Other Ambulatory Visit (HOSPITAL_COMMUNITY): Payer: Self-pay

## 2021-07-07 DIAGNOSIS — F1994 Other psychoactive substance use, unspecified with psychoactive substance-induced mood disorder: Secondary | ICD-10-CM

## 2021-07-07 DIAGNOSIS — Z789 Other specified health status: Secondary | ICD-10-CM

## 2021-07-07 DIAGNOSIS — F332 Major depressive disorder, recurrent severe without psychotic features: Secondary | ICD-10-CM

## 2021-07-07 MED ORDER — TIZANIDINE HCL 4 MG PO TABS
4.0000 mg | ORAL_TABLET | Freq: Three times a day (TID) | ORAL | Status: DC | PRN
  Administered 2021-07-07 (×2): 4 mg via ORAL
  Filled 2021-07-07 (×2): qty 1

## 2021-07-07 MED ORDER — LIDOCAINE 5 % EX PTCH
1.0000 | MEDICATED_PATCH | CUTANEOUS | 0 refills | Status: DC
  Filled 2021-07-07: qty 10, 10d supply, fill #0

## 2021-07-07 MED ORDER — OXYCODONE-ACETAMINOPHEN 5-325 MG PO TABS
1.0000 | ORAL_TABLET | Freq: Four times a day (QID) | ORAL | Status: DC | PRN
  Administered 2021-07-07 (×2): 1 via ORAL
  Filled 2021-07-07 (×2): qty 1

## 2021-07-07 MED ORDER — IBUPROFEN 400 MG PO TABS
600.0000 mg | ORAL_TABLET | Freq: Four times a day (QID) | ORAL | Status: DC | PRN
  Administered 2021-07-07 (×2): 600 mg via ORAL
  Filled 2021-07-07 (×2): qty 1

## 2021-07-07 MED ORDER — NAPROXEN 500 MG PO TABS
500.0000 mg | ORAL_TABLET | Freq: Two times a day (BID) | ORAL | 0 refills | Status: AC
  Filled 2021-07-07: qty 10, 5d supply, fill #0

## 2021-07-07 NOTE — Consult Note (Signed)
Message sent to patient's nurse and tech for telepsych assessment.  Awaiting response.

## 2021-07-07 NOTE — ED Notes (Signed)
Pending d/c. SW paged. Message left.

## 2021-07-07 NOTE — ED Notes (Signed)
Patient giving breakfast tray

## 2021-07-07 NOTE — BH Assessment (Signed)
Comprehensive Clinical Assessment (CCA) Note  07/07/2021 Rayford Williamsen [id number]  Disposition: Roselyn Bering, NP recommends pt to be observed and reassessed by psychiatry. Disposition discussed with Dola Argyle. O'Neal, RN. RN to inform EDP of disposition.   Flowsheet Row ED from 07/06/2021 in [facility]PART[facility] ED from 06/23/2021 in [facility] from 06/01/2021 in [facility]  C-SSRS RISK CATEGORY High Risk No Risk No Risk      The patient demonstrates the following risk factors for suicide: Chronic risk factors for suicide include: psychiatric disorder of Diagnosis: Major Depressive Disorder, recurrent, severe without psychotic features, substance use disorder, and previous suicide attempts Pt attempted suicide last year in [place], [place] . Acute risk factors for suicide include:  Chronic leg pain . Protective factors for this patient include: positive social support and responsibility to others (children, family). Considering these factors, the overall suicide risk at this point appears to be high. Patient is not appropriate for outpatient follow up.  Nico Syme is a 60 year old male who presents voluntary and unaccompanied to [facility].Clinician asked the pt, "what brought you to the hospital?" Pt reports, he was with his probation officer when his leg continued to hurt. Pt expressed that he was having bad thoughts, his probation officer sent him to the hospital. Per pt, his left leg began hurting a couple months ago, he's unable to work consistently, walk around, which is causing him to get behind on his bills. Pt reports, he's been to the ED twice about the leg pain with no relief. Pt reports, he's thinking about poisoning himself by overdosing on his medications. Pt report, he overdosed on medication about a year ago as a suicide attempt. Pt reports, his grandchildren are his motivation to  get help and not hurt himself. Pt denies, HI, AVH, self-injurious behaviors and access to weapons.   Pt reports, when he was in [place] he was drinking and was approached by someone to take suitcases to [place] for $2,000. Pt reports, he was stopped at the airport and learned Cocaine was in the suitcase. Per pt, he feels he was being watched, the person seen him frequent the bar. Pt reports, he was in jail for two years,  has six more months of probation after almost three years.   Pt reports, drinking alcohol yesterday. Pt's UDS is positive for marijuana. Pt's BAL was 283 at 1633. Pt denies, being linked to OPT resources (medication management and/or counseling.) Pt has a previous inpatient admission after suicide attempt last year in [place], [place].   Pt presents tearful at times with normal speech. Pt's mood, affect was depressed. Pt's insight was fair. Pt's judgement was poor. Clinician asked the pt if he can contract for safety, pt replied, "I don't know, I hope not."   Diagnosis: Major Depressive Disorder, recurrent, severe without psychotic features.   Chief Complaint:  Chief Complaint  Patient presents with   Suicidal   Visit Diagnosis:     CCA Screening, Triage and Referral (STR)  Patient Reported Information How did you hear about [place]? No data recorded What Is the Reason for Your Visit/Call Today? Per EDP/PA note: "a 60 y.o. male  was evaluated in triage. Pt complains of suicidal ideations. Patient reports that he has had suicidal ideations over the last week. Suicidal ideations became worse last night. Patient has active plan to overdose on his prescribed medications. Patient states that he has a previous suicide attempt 1  year prior via overdose on pills. Patient reports that his suicidal thoughts are revolving around his left lumbar back pain. Patient reports that he has had lumbar back pain over the last 2 to 3 months. Patient reports he was diagnosed with sciatica. Patient  does endorse night sweats and approximately 20 pound weight loss over the last 2 to 3 months. Denies any fevers, chills, saddle anesthesia, bowel or bladder dysfunction, previous history of cancer. Patient denies any illicit drug use. Patient endorses EtOH use."  How Long Has This Been Causing You Problems? 1-6 months  What Do You Feel Would Help You the Most Today? Treatment for Depression or other mood problem; Alcohol or Drug Use Treatment   Have You Recently Had Any Thoughts About Hurting Yourself? Yes  Are You Planning to Commit Suicide/Harm Yourself At This time? Yes   Have you Recently Had Thoughts About Hurting Someone [name]? No  Are You Planning to Harm Someone at This Time? No  Explanation: No data recorded  Have You Used Any Alcohol or Drugs in the Past 24 Hours? Yes  How Long Ago Did You Use Drugs or Alcohol? No data recorded What Did You Use and How Much? Pt reports, he drank alochol yesterday.   Do You Currently Have a Therapist/Psychiatrist? No  Name of Therapist/Psychiatrist: No data recorded  Have You Been Recently Discharged From Any Office Practice or Programs? No data recorded Explanation of Discharge From Practice/Program: No data recorded    CCA Screening Triage Referral Assessment Type of Contact: Tele-Assessment  Telemedicine Service Delivery: Telemedicine service delivery: This service was provided via telemedicine using a 2-way, interactive audio and video technology  Is this Initial or Reassessment? Initial Assessment  Date Telepsych consult ordered in [facility]:  07/06/21  Time Telepsych consult ordered in [facility]:  1934  Location of Assessment: [facility] ED  Provider Location: [facility] Assessment Services   Collateral Involvement: None   Does Patient Have a Automotive engineer Guardian? No data recorded Name and Contact of Legal Guardian: No data recorded If Minor and Not Living with Parent(s), Who has Custody? No data recorded Is CPS involved or ever  been involved? No data recorded Is APS involved or ever been involved? No data recorded  Patient Determined To Be At Risk for Harm To Self or Others Based on Review of Patient Reported Information or Presenting Complaint? Yes, for Self-Harm  Method: No data recorded Availability of Means: No data recorded Intent: No data recorded Notification Required: No data recorded Additional Information for Danger to Others Potential: No data recorded Additional Comments for Danger to Others Potential: No data recorded Are There Guns or Other Weapons in Your Home? No data recorded Types of Guns/Weapons: No data recorded Are These Weapons Safely Secured?                            No data recorded Who Could Verify You Are Able To Have These Secured: No data recorded Do You Have any Outstanding Charges, Pending Court Dates, Parole/Probation? No data recorded Contacted To Inform of Risk of Harm To Self or Others: No data recorded   Does Patient Present under Involuntary Commitment? No  IVC Papers Initial File Date: No data recorded  [place] of Residence: [place]   Patient Currently Receiving the Following Services: Individual Therapy   Determination of Need: Urgent (48 hours)   Options For Referral: Other: Comment (Pt to be observe and reassessed by psychiatry.)  CCA Biopsychosocial Patient Reported Schizophrenia/Schizoaffective Diagnosis in Past: No data recorded  Strengths: No data recorded  Mental Health Symptoms Depression:   Irritability; Hopelessness; Worthlessness; Tearfulness; Fatigue; Sleep (too much or little)   Duration of Depressive symptoms:    Mania:  No data recorded  Anxiety:    Worrying; Tension   Psychosis:   None   Duration of Psychotic symptoms:    Trauma:   None   Obsessions:   None   Compulsions:   None   Inattention:   None   Hyperactivity/Impulsivity:   Symptoms present before age 34   Oppositional/Defiant Behaviors:   None    Emotional Irregularity:  No data recorded  Other Mood/Personality Symptoms:  No data recorded   Mental Status Exam Appearance and self-care  Stature:   Average   Weight:   Average weight   Clothing:   -- (In scrubs.)   Grooming:   Normal   Cosmetic use:   None   Posture/gait:   Normal   Motor activity:   Not Remarkable   Sensorium  Attention:   Normal   Concentration:   Normal   Orientation:  No data recorded  Recall/memory:   Normal   Affect and Mood  Affect:   Depressed   Mood:   Depressed   Relating  Eye contact:   Normal   Facial expression:   Depressed   Attitude toward examiner:   Cooperative   Thought and Language  Speech flow:  Normal   Thought content:   Appropriate to Mood and Circumstances   Preoccupation:   Suicide   Hallucinations:   None   Organization:  No data recorded  [employer] of Knowledge:   Fair   Intelligence:   Average   Abstraction:  No data recorded  Judgement:   Poor   Reality Testing:  No data recorded  Insight:   Fair   Decision Making:   Impulsive   Social Functioning  Social Maturity:  No data recorded  Social Judgement:  No data recorded  Stress  Stressors:   Other (Comment) (Leg pain.)   Coping Ability:   Overwhelmed   Skill Deficits:   Decision making   Supports:   Family     Religion: Religion/Spirituality Are You A Religious Person?: No  Leisure/Recreation:    Exercise/Diet: Exercise/Diet Do You Follow a Special Diet?: No Do You Have Any Trouble Sleeping?: Yes Explanation of Sleeping Difficulties: Pt reports, waking up often.   CCA Employment/Education Employment/Work Situation: Employment / Work Situation Employment Situation: Employed Work Stressors: Pt is unable to work as usual due to chronic leg pain. Has Patient ever Been in the Military?: No  Education: Education Is Patient Currently Attending School?: No Last Grade Completed:   (GED.) Did You Attend College?: No   CCA Family/Childhood History Family and Relationship History: Family history Marital status: Divorced Divorced, when?: Per pt, five years ago. Does patient have children?: Yes How many children?: 4 How is patient's relationship with their children?: Pt reports, he has two sons, two daughters and nine grandchildren.  Childhood History:  Childhood History Did patient suffer any verbal/emotional/physical/sexual abuse as a child?: No Has patient ever been sexually abused/assaulted/raped as an adolescent or adult?: No Was the patient ever a victim of a crime or a disaster?: No Witnessed domestic violence?: No Has patient been affected by domestic violence as an adult?:  (NA)  Child/Adolescent Assessment:     CCA Substance Use Alcohol/Drug Use: Alcohol / Drug  Use Pain Medications: See MAR Prescriptions: See MAR Over the Counter: See MAR    ASAM's:  Six Dimensions of Multidimensional Assessment  Dimension 1:  Acute Intoxication and/or Withdrawal Potential:      Dimension 2:  Biomedical Conditions and Complications:      Dimension 3:  Emotional, Behavioral, or Cognitive Conditions and Complications:     Dimension 4:  Readiness to Change:     Dimension 5:  Relapse, Continued use, or Continued Problem Potential:     Dimension 6:  Recovery/Living Environment:     ASAM Severity Score:    ASAM Recommended Level of Treatment:     Substance use Disorder (SUD)    Recommendations for Services/Supports/Treatments: Recommendations for Services/Supports/Treatments Recommendations For Services/Supports/Treatments: Other (Comment) (Pt to be observed and reassessed by psychiatry.)  Discharge Disposition:    DSM5 Diagnoses: Patient Active Problem List   Diagnosis Date Noted   Foot abscess, left 11/24/2020   Anxiety 11/24/2020   Hyperkalemia 11/24/2020     Referrals to Alternative Service(s): Referred to Alternative Service(s):   Place:    Date:   Time:    Referred to Alternative Service(s):   Place:   Date:   Time:    Referred to Alternative Service(s):   Place:   Date:   Time:    Referred to Alternative Service(s):   Place:   Date:   Time:     Redmond Pulling, [facility] Comprehensive Clinical Assessment (CCA) Screening, Triage and Referral Note  07/07/2021 Zaden Sako [id number]  Chief Complaint:  Chief Complaint  Patient presents with   Suicidal   Visit Diagnosis:   Patient Reported Information How did you hear about [place]? No data recorded What Is the Reason for Your Visit/Call Today? Per EDP/PA note: "a 60 y.o. male  was evaluated in triage. Pt complains of suicidal ideations. Patient reports that he has had suicidal ideations over the last week. Suicidal ideations became worse last night. Patient has active plan to overdose on his prescribed medications. Patient states that he has a previous suicide attempt 1 year prior via overdose on pills. Patient reports that his suicidal thoughts are revolving around his left lumbar back pain. Patient reports that he has had lumbar back pain over the last 2 to 3 months. Patient reports he was diagnosed with sciatica. Patient does endorse night sweats and approximately 20 pound weight loss over the last 2 to 3 months. Denies any fevers, chills, saddle anesthesia, bowel or bladder dysfunction, previous history of cancer. Patient denies any illicit drug use. Patient endorses EtOH use."  How Long Has This Been Causing You Problems? 1-6 months  What Do You Feel Would Help You the Most Today? Treatment for Depression or other mood problem; Alcohol or Drug Use Treatment   Have You Recently Had Any Thoughts About Hurting Yourself? Yes  Are You Planning to Commit Suicide/Harm Yourself At This time? Yes   Have you Recently Had Thoughts About Hurting Someone [name]? No  Are You Planning to Harm Someone at This Time? No  Explanation: No data recorded  Have You Used Any Alcohol or  Drugs in the Past 24 Hours? Yes  How Long Ago Did You Use Drugs or Alcohol? No data recorded What Did You Use and How Much? Pt reports, he drank alochol yesterday.   Do You Currently Have a Therapist/Psychiatrist? No  Name of Therapist/Psychiatrist: No data recorded  Have You Been Recently Discharged From Any Office Practice or Programs? No data recorded Explanation of Discharge  From Practice/Program: No data recorded   CCA Screening Triage Referral Assessment Type of Contact: Tele-Assessment  Telemedicine Service Delivery: Telemedicine service delivery: This service was provided via telemedicine using a 2-way, interactive audio and video technology  Is this Initial or Reassessment? Initial Assessment  Date Telepsych consult ordered in [facility]:  07/06/21  Time Telepsych consult ordered in [facility]:  1934  Location of Assessment: [facility] ED  Provider Location: [facility] Assessment Services   Collateral Involvement: None   Does Patient Have a Automotive engineer Guardian? No data recorded Name and Contact of Legal Guardian: No data recorded If Minor and Not Living with Parent(s), Who has Custody? No data recorded Is CPS involved or ever been involved? No data recorded Is APS involved or ever been involved? No data recorded  Patient Determined To Be At Risk for Harm To Self or Others Based on Review of Patient Reported Information or Presenting Complaint? Yes, for Self-Harm  Method: No data recorded Availability of Means: No data recorded Intent: No data recorded Notification Required: No data recorded Additional Information for Danger to Others Potential: No data recorded Additional Comments for Danger to Others Potential: No data recorded Are There Guns or Other Weapons in Your Home? No data recorded Types of Guns/Weapons: No data recorded Are These Weapons Safely Secured?                            No data recorded Who Could Verify You Are Able To Have These Secured: No data  recorded Do You Have any Outstanding Charges, Pending Court Dates, Parole/Probation? No data recorded Contacted To Inform of Risk of Harm To Self or Others: No data recorded  Does Patient Present under Involuntary Commitment? No  IVC Papers Initial File Date: No data recorded  [place] of Residence: [place]   Patient Currently Receiving the Following Services: Individual Therapy   Determination of Need: Urgent (48 hours)   Options For Referral: Other: Comment (Pt to be observe and reassessed by psychiatry.)   Discharge Disposition:     Redmond Pulling, [facility]      Redmond Pulling, MS, [facility], [facility] Triage Specialist [phone]

## 2021-07-07 NOTE — ED Notes (Signed)
Patient ate all his food and drank cranberry juice

## 2021-07-07 NOTE — ED Notes (Signed)
TTS initiated, however unable to complete in [facility], delayed.

## 2021-07-07 NOTE — BH Assessment (Signed)
[name] Assessment Progress Note   Per Ophelia Shoulder, NP, this pt does not require psychiatric hospitalization at this time.  Pt is psychiatrically cleared.  Discharge instructions include referral information for [facility].  Pt's nurse, Christiane Ha, has been notified.  Doylene Canning, MA Triage Specialist [phone]

## 2021-07-07 NOTE — ED Notes (Signed)
Breakfast Orders placed 

## 2021-07-07 NOTE — Consult Note (Signed)
Telepsych Consultation   Reason for Consult:  Psychiatric Reassessment Referring Physician:  Dr. Eber Hong Location of Patient:    [facility] Location of Provider: Other: virtual home office  Patient Identification: Willie Collins MRN:  [id number] Principal Diagnosis: Substance induced mood disorder (HCC) Diagnosis:  Principal Problem:   Substance induced mood disorder (HCC) Active Problems:   Major depressive disorder, recurrent episode, severe (HCC)   Alcohol use   Total Time spent with patient: 30 minutes  HPI:  Per EDP Admission Assessment 07/06/2021: Willie Collins , a 60 y.o. male  was evaluated in triage.  Pt complains of suicidal ideations.  Patient reports that he has had suicidal ideations over the last week.  Suicidal ideations became worse last night.  Patient has active plan to overdose on his prescribed medications.  Patient states that he has a previous suicide attempt 1 year prior via overdose on pills.  Patient reports that his suicidal thoughts are revolving around his left lumbar back pain.  Patient reports that he has had lumbar back pain over the last 2 to 3 months.  Patient reports he was diagnosed with sciatica.  Patient does endorse night sweats and approximately 20 pound weight loss over the last 2 to 3 months.  Denies any fevers, chills, saddle anesthesia, bowel or bladder dysfunction, previous history of cancer.   Patient denies any illicit drug use.  Patient endorses EtOH use.   Subjective:   Willie Collins is a 60 y.o. male patient admitted with substance induced mood.  On arrival his BAL was 283, UDS + for THC.  Patient states, "I was not in a good place."  Patient seen via telepsych by this provider; chart reviewed and consulted with Dr. Lucianne Muss on 07/07/21.  On evaluation Willie Collins reports hx for MDD, alcoholism and chronic back pain.  He had a flare up of low back pain that increased over the past few weeks.  He is not being  followed by outpatient provider for this concern or pain mgmt.  States he began feeling hopeless, as pain affected his ability to work and he started drinking; consequently began having suicidal ideations and came to the hospital for care.  States he is usually impulsive when he drinks and no longer feels this way when he is not drinking.  Today he tells me he no longer has thoughts of self harm, lives with his daughter and granddaughter whom he lists as strong protective factors.  In total he has 9 grandchildren, smiles while discussing them.  States after her tried to hurt himself last year, he promised he would not do that again because of his family.  Since arriving at the hospital, he was started on a multi-modal pain approach by the emergency room provider.  Patient now reports pain level of 5/10, which he reports is improved significantly since admission.  The improvement in pain has given him a better outlook.  During evaluation Willie Collins is laying upright on hospital gurney.  He is alert/oriented x 4; calm/cooperative; and mood congruent with affect.  Patient is speaking in a clear tone at moderate volume, and normal pace; with good eye contact. His thought process is coherent and relevant; There is no indication that he is currently responding to internal/external stimuli or experiencing delusional thought content.  Patient denies suicidal/self-harm/homicidal ideation, psychosis, and paranoia.  Patient has remained calm throughout assessment and has answered questions appropriately.   Past Psychiatric History: MDD, Alcohol Abuse  Risk to Self:  no Risk  to Others:  no Prior Inpatient Therapy: yes, one year prior  Prior Outpatient Therapy:  unsure  Past Medical History:  Past Medical History:  Diagnosis Date   Hypertension     Past Surgical History:  Procedure Laterality Date   IRRIGATION AND DEBRIDEMENT FOOT Left 11/25/2020   Procedure: IRRIGATION AND DEBRIDEMENT FOOT;  Surgeon:  Candelaria Stagers, DPM;  Location: [facility];  Service: Podiatry;  Laterality: Left;   Family History: No family history on file. Family Psychiatric  History: unknown Social History:  Social History   Substance and Sexual Activity  Alcohol Use Yes   Comment: weekends     Social History   Substance and Sexual Activity  Drug Use Never    Social History   Socioeconomic History   Marital status: Single    Spouse name: Not on file   Number of children: Not on file   Years of education: Not on file   Highest education level: Not on file  Occupational History   Not on file  Tobacco Use   Smoking status: Never   Smokeless tobacco: Never  Vaping Use   Vaping Use: Never used  Substance and Sexual Activity   Alcohol use: Yes    Comment: weekends   Drug use: Never   Sexual activity: Yes    Partners: Female  Other Topics Concern   Not on file  Social History Narrative   Not on file   Social Determinants of Health   Financial Resource Strain: Not on file  Food Insecurity: Not on file  Transportation Needs: Not on file  Physical Activity: Not on file  Stress: Not on file  Social Connections: Not on file   Additional Social History:    Allergies:  No Known Allergies  Labs:  Results for orders placed or performed during the hospital encounter of 07/06/21 (from the past 48 hour(s))  Rapid urine drug screen (hospital performed)     Status: Abnormal   Collection Time: 07/06/21  4:20 PM  Result Value Ref Range   Opiates NONE DETECTED NONE DETECTED   Cocaine NONE DETECTED NONE DETECTED   Benzodiazepines NONE DETECTED NONE DETECTED   Amphetamines NONE DETECTED NONE DETECTED   Tetrahydrocannabinol POSITIVE (A) NONE DETECTED   Barbiturates NONE DETECTED NONE DETECTED    Comment: (NOTE) DRUG SCREEN FOR MEDICAL PURPOSES ONLY.  IF CONFIRMATION IS NEEDED FOR ANY PURPOSE, NOTIFY LAB WITHIN 5 DAYS.  LOWEST DETECTABLE LIMITS FOR URINE DRUG SCREEN Drug Class                      Cutoff (ng/mL) Amphetamine and metabolites    1000 Barbiturate and metabolites    200 Benzodiazepine                 200 Tricyclics and metabolites     300 Opiates and metabolites        300 Cocaine and metabolites        300 THC                            50 Performed at [facility], [address], [place], [place] [address]   Urinalysis, Routine w reflex microscopic Urine, Clean Catch     Status: Abnormal   Collection Time: 07/06/21  4:30 PM  Result Value Ref Range   Color, Urine STRAW (A) YELLOW   APPearance CLEAR CLEAR   Specific Gravity, Urine 1.010 1.005 -  1.030   pH 5.0 5.0 - 8.0   Glucose, UA NEGATIVE NEGATIVE mg/dL   Hgb urine dipstick NEGATIVE NEGATIVE   Bilirubin Urine NEGATIVE NEGATIVE   Ketones, ur NEGATIVE NEGATIVE mg/dL   Protein, ur NEGATIVE NEGATIVE mg/dL   Nitrite NEGATIVE NEGATIVE   Leukocytes,Ua NEGATIVE NEGATIVE    Comment: Performed at [facility], [address] [place], [place] [address]  Comprehensive metabolic panel     Status: Abnormal   Collection Time: 07/06/21  4:33 PM  Result Value Ref Range   Sodium 140 135 - 145 mmol/L   Potassium 4.1 3.5 - 5.1 mmol/L   Chloride 106 98 - 111 mmol/L   CO2 21 (L) 22 - 32 mmol/L   Glucose, Bld 113 (H) 70 - 99 mg/dL    Comment: Glucose reference range applies only to samples taken after fasting for at least 8 hours.   BUN 17 8 - 23 mg/dL   Creatinine, Ser 9.37 0.61 - 1.24 mg/dL   Calcium 9.0 8.9 - 90.2 mg/dL   Total Protein 6.8 6.5 - 8.1 g/dL   Albumin 4.0 3.5 - 5.0 g/dL   AST 21 15 - 41 U/L   ALT 20 0 - 44 U/L   Alkaline Phosphatase 65 38 - 126 U/L   Total Bilirubin 0.4 0.3 - 1.2 mg/dL   GFR, Estimated >40 >97 mL/min    Comment: (NOTE) Calculated using the CKD-EPI Creatinine Equation (2021)    Anion gap 13 5 - 15    Comment: Performed at [facility], [address], [place], [place] [address]  Ethanol     Status: Abnormal   Collection Time: 07/06/21  4:33 PM  Result Value Ref  Range   Alcohol, Ethyl (B) 283 (H) <10 mg/dL    Comment: (NOTE) Lowest detectable limit for serum alcohol is 10 mg/dL.  For medical purposes only. Performed at [facility], [address] [place], [place] [address]   Salicylate level     Status: Abnormal   Collection Time: 07/06/21  4:33 PM  Result Value Ref Range   Salicylate Lvl <7.0 (L) 7.0 - 30.0 mg/dL    Comment: Performed at [facility], [address], [place], [place] [address]  Acetaminophen level     Status: Abnormal   Collection Time: 07/06/21  4:33 PM  Result Value Ref Range   Acetaminophen (Tylenol), Serum <10 (L) 10 - 30 ug/mL    Comment: (NOTE) Therapeutic concentrations vary significantly. A range of 10-30 ug/mL  may be an effective concentration for many patients. However, some  are best treated at concentrations outside of this range. Acetaminophen concentrations >150 ug/mL at 4 hours after ingestion  and >50 ug/mL at 12 hours after ingestion are often associated with  toxic reactions.  Performed at [facility], [address] [place], [place] [address]   CBC with Diff     Status: None   Collection Time: 07/06/21  4:33 PM  Result Value Ref Range   WBC 5.7 4.0 - 10.5 K/uL   RBC 5.37 4.22 - 5.81 MIL/uL   Hemoglobin 15.7 13.0 - 17.0 g/dL   HCT 79.8 92.1 - 19.4 %   MCV 90.3 80.0 - 100.0 fL   MCH 29.2 26.0 - 34.0 pg   MCHC 32.4 30.0 - 36.0 g/dL   RDW 17.4 08.1 - 44.8 %   Platelets 307 150 - 400 K/uL   nRBC 0.0 0.0 - 0.2 %  Neutrophils Relative % 53 %   Neutro Abs 3.1 1.7 - 7.7 K/uL   Lymphocytes Relative 35 %   Lymphs Abs 2.0 0.7 - 4.0 K/uL   Monocytes Relative 7 %   Monocytes Absolute 0.4 0.1 - 1.0 K/uL   Eosinophils Relative 3 %   Eosinophils Absolute 0.2 0.0 - 0.5 K/uL   Basophils Relative 1 %   Basophils Absolute 0.0 0.0 - 0.1 K/uL   Immature Granulocytes 1 %   Abs Immature Granulocytes 0.06 0.00 - 0.07 K/uL    Comment: Performed at [facility], [address] [place], [place] [address]  Resp Panel by RT-PCR (Flu A&B, Covid) Nasopharyngeal Swab     Status: None   Collection Time: 07/06/21  7:23 PM   Specimen: Nasopharyngeal Swab; Nasopharyngeal(NP) swabs in vial transport medium  Result Value Ref Range   SARS Coronavirus 2 by RT PCR NEGATIVE NEGATIVE    Comment: (NOTE) SARS-CoV-2 target nucleic acids are NOT DETECTED.  The SARS-CoV-2 RNA is generally detectable in upper respiratory specimens during the acute phase of infection. The lowest concentration of SARS-CoV-2 viral copies this assay can detect is 138 copies/mL. A negative result does not preclude SARS-Cov-2 infection and should not be used as the sole basis for treatment or other patient management decisions. A negative result may occur with  improper specimen collection/handling, submission of specimen other than nasopharyngeal swab, presence of viral mutation(s) within the areas targeted by this assay, and inadequate number of viral copies(<138 copies/mL). A negative result must be combined with clinical observations, patient history, and epidemiological information. The expected result is Negative.  Fact Sheet for Patients:  BloggerCourse.com  Fact Sheet for Healthcare Providers:  SeriousBroker.it  This test is no t yet approved or cleared by the [place] FDA and  has been authorized for detection and/or diagnosis of SARS-CoV-2 by FDA under an Emergency Use Authorization (EUA). This EUA will remain  in effect (meaning this test can be used) for the duration of the COVID-19 declaration under Section 564(b)(1) of the Act, 21 U.S.C.section 360bbb-3(b)(1), unless the authorization is terminated  or revoked sooner.       Influenza A by PCR NEGATIVE NEGATIVE   Influenza B by PCR NEGATIVE NEGATIVE    Comment: (NOTE) The Xpert Xpress SARS-CoV-2/FLU/RSV plus assay is intended as an aid in the diagnosis of influenza from  Nasopharyngeal swab specimens and should not be used as a sole basis for treatment. Nasal washings and aspirates are unacceptable for Xpert Xpress SARS-CoV-2/FLU/RSV testing.  Fact Sheet for Patients: BloggerCourse.com  Fact Sheet for Healthcare Providers: SeriousBroker.it  This test is not yet approved or cleared by the [place] FDA and has been authorized for detection and/or diagnosis of SARS-CoV-2 by FDA under an Emergency Use Authorization (EUA). This EUA will remain in effect (meaning this test can be used) for the duration of the COVID-19 declaration under Section 564(b)(1) of the Act, 21 U.S.C. section 360bbb-3(b)(1), unless the authorization is terminated or revoked.  Performed at [facility], [address] [place], [place] [address]     Medications:  Current Facility-Administered Medications  Medication Dose Route Frequency Provider Last Rate Last Admin   ibuprofen (ADVIL) tablet 600 mg  600 mg Oral Q6H PRN Cheryll Cockayne, MD   600 mg at 07/07/21 0831   lidocaine (LIDODERM) 5 % 1 patch  1 patch Transdermal Q24H Doran Clay, MD   1 patch at 07/06/21 1926   oxyCODONE-acetaminophen (PERCOCET/ROXICET)  5-325 MG per tablet 1 tablet  1 tablet Oral Q6H PRN Cheryll Cockayne, MD   1 tablet at 07/07/21 0831   tiZANidine (ZANAFLEX) tablet 4 mg  4 mg Oral Q8H PRN Cheryll Cockayne, MD   4 mg at 07/07/21 0630   Current Outpatient Medications  Medication Sig Dispense Refill   amLODipine (NORVASC) 5 MG tablet Take 1 tablet (5 mg total) by mouth daily. 90 tablet 0   colchicine 0.6 MG tablet Take 1 tablet (0.6 mg total) by mouth daily. 7 tablet 0   indomethacin (INDOCIN) 25 MG capsule Take 1 capsule (25 mg total) by mouth 3 (three) times daily as needed. 30 capsule 0   methocarbamol (ROBAXIN) 500 MG tablet Take 1 tablet (500 mg total) by mouth every 8 (eight) hours as needed for muscle spasms. 20 tablet 0   morphine (MSIR)  15 MG tablet Take 0.5 tablets (7.5 mg total) by mouth every 4 (four) hours as needed for severe pain. 5 tablet 0    Musculoskeletal: Strength & Muscle Tone: within normal limits Gait & Station: normal Patient leans: N/A   Psychiatric Specialty Exam:  Presentation  General Appearance: Appropriate for Environment; Casual  Eye Contact:Good  Speech:Clear and Coherent; Normal Rate  Speech Volume:Normal  Handedness:Right   Mood and Affect  Mood:Depressed but does smile and interact appropriately during assessment  Affect:Appropriate; Full Range   Thought Process  Thought Processes:Coherent; Goal Directed  Descriptions of Associations:Intact  Orientation:Full (Time, Place and Person)  Thought Content:Logical  History of Schizophrenia/Schizoaffective disorder:No data recorded Duration of Psychotic Symptoms:No data recorded Hallucinations:Hallucinations: None  Ideas of Reference:None  Suicidal Thoughts:Suicidal Thoughts: No (has improved since admission and he's no longer intoxicated)  Homicidal Thoughts:Homicidal Thoughts: No   Sensorium  Memory:Immediate Good; Recent Good; Remote Good  Judgment:Good (improved since he is no longer intoxicated)  Insight:Fair   Executive Functions  Concentration:Good  Attention Span:Good  Recall:Good  Fund of Knowledge:Good  Language:Good   Psychomotor Activity  Psychomotor Activity:Psychomotor Activity: Normal   Assets  Assets:Communication Skills; Desire for Improvement; Resilience; Social Support; Vocational/Educational   Sleep  Sleep:Sleep: Good Number of Hours of Sleep: 8   Physical Exam: Physical Exam Constitutional:      Appearance: Normal appearance.  Cardiovascular:     Rate and Rhythm: Normal rate.     Pulses: Normal pulses.  Pulmonary:     Effort: Pulmonary effort is normal.  Musculoskeletal:     Cervical back: Normal range of motion.  Neurological:     General: No focal deficit present.      Mental Status: He is alert and oriented to person, place, and time.  Psychiatric:        Thought Content: Thought content normal.        Judgment: Judgment normal.   Review of Systems  Constitutional: Negative.   HENT: Negative.    Eyes: Negative.   Respiratory: Negative.    Cardiovascular: Negative.   Gastrointestinal: Negative.   Genitourinary: Negative.   Musculoskeletal: Negative.   Skin: Negative.   Neurological: Negative.   Endo/Heme/Allergies: Negative.   Psychiatric/Behavioral:  Positive for depression and substance abuse. Negative for hallucinations and suicidal ideas. The patient is not nervous/anxious.   Blood pressure 127/74, pulse 77, temperature 98.5 F (36.9 C), temperature source Oral, resp. rate 18, SpO2 99 %. There is no height or weight on file to calculate BMI.  Treatment Plan Summary: Plan- As per above assessment, there are no current grounds for involuntary commitment at this  time.  Patient is future oriented and relates his plan return to work seek outpatient mental health services.  He would benefit from starting duloxetine for mood and pain mgmt but will defer to outpatient care.  Asking SW to refer him to [facility] for mental health services and medication management.   Patient is not currently interested in inpatient services but expresses agreement to continue outpatient treatment., we have reviewed importance of substance abuse abstinence, potential negative impact substance abuse can have on his relationships and level of functioning, and importance of medication compliance. Plan- As per above assessment, there are no current grounds for involuntary commitment at this time.  Patient is not currently interested in inpatient services but expresses agreement to continue outpatient treatment., we have reviewed importance of substance abuse abstinence, potential negative impact substance abuse can have on his relationships and level of functioning, and  importance of medication compliance.  Disposition: No evidence of imminent risk to self or others at present.   Patient does not meet criteria for psychiatric inpatient admission. Supportive therapy provided about ongoing stressors. Discussed crisis plan, support from social network, calling 911, coming to the Emergency Department, and calling Suicide Hotline.  This service was provided via telemedicine using a 2-way, interactive audio and video technology.  Names of all persons participating in this telemedicine service and their role in this encounter. Name: Willie Collins Role: Patient  Name: Ophelia Shoulder Role: PMHNP  Name: Nelly Rout Role: Psychiatrist    Chales Abrahams, NP 07/07/2021 11:25 AM

## 2021-07-07 NOTE — ED Provider Notes (Signed)
Patient medically cleared and cleared by psychiatry for continued outpatient care.  Advised return for worsening symptoms or any additional concerns.    Cheryll Cockayne, MD 07/07/21 1410

## 2021-07-07 NOTE — ED Notes (Signed)
TTS complete, returned to [facility]. Alert, NAD, calm. Sitter present.

## 2021-07-07 NOTE — Discharge Instructions (Signed)
For your behavioral health needs you are advised to follow up with [facility] at your earliest opportunity: °     [facility] °     [address] °     [place], [place] [address] °     [phone] °     They offer psychiatry/medication management, therapy and substance use disorder treatment.  New patients are seen in their walk-in clinic.  Walk-in hours are Monday - Thursday from 8:00 am - 11:00 am for psychiatry, and Friday from 1:00 pm - 4:00 pm for therapy.  Walk-in patients are seen on a first come, first served basis, so try to arrive as early as possible for the best chance of being seen the same day.  Please note that to be eligible for services you must bring an ID or a piece of mail with your name and a [place] address. ° °

## 2021-07-29 ENCOUNTER — Other Ambulatory Visit: Payer: Self-pay

## 2021-07-29 ENCOUNTER — Other Ambulatory Visit (HOSPITAL_COMMUNITY)
Admission: EM | Admit: 2021-07-29 | Discharge: 2021-08-04 | Disposition: A | Discharge status: Home / Self Care | Payer: No Payment, Other | Attending: Psychiatry | Admitting: Psychiatry

## 2021-07-29 DIAGNOSIS — F1994 Other psychoactive substance use, unspecified with psychoactive substance-induced mood disorder: Secondary | ICD-10-CM | POA: Insufficient documentation

## 2021-07-29 DIAGNOSIS — M549 Dorsalgia, unspecified: Secondary | ICD-10-CM | POA: Insufficient documentation

## 2021-07-29 DIAGNOSIS — R45851 Suicidal ideations: Secondary | ICD-10-CM | POA: Insufficient documentation

## 2021-07-29 DIAGNOSIS — F332 Major depressive disorder, recurrent severe without psychotic features: Secondary | ICD-10-CM | POA: Insufficient documentation

## 2021-07-29 DIAGNOSIS — Z20822 Contact with and (suspected) exposure to covid-19: Secondary | ICD-10-CM | POA: Insufficient documentation

## 2021-07-29 DIAGNOSIS — F1010 Alcohol abuse, uncomplicated: Secondary | ICD-10-CM | POA: Insufficient documentation

## 2021-07-29 DIAGNOSIS — F4323 Adjustment disorder with mixed anxiety and depressed mood: Secondary | ICD-10-CM | POA: Insufficient documentation

## 2021-07-29 DIAGNOSIS — R45850 Homicidal ideations: Secondary | ICD-10-CM | POA: Insufficient documentation

## 2021-07-29 LAB — COMPREHENSIVE METABOLIC PANEL
ALT: 50 U/L — ABNORMAL HIGH (ref 0–44)
AST: 55 U/L — ABNORMAL HIGH (ref 15–41)
Albumin: 4.2 g/dL (ref 3.5–5.0)
Alkaline Phosphatase: 61 U/L (ref 38–126)
Anion gap: 15 (ref 5–15)
BUN: 8 mg/dL (ref 8–23)
CO2: 25 mmol/L (ref 22–32)
Calcium: 9.2 mg/dL (ref 8.9–10.3)
Chloride: 99 mmol/L (ref 98–111)
Creatinine, Ser: 0.75 mg/dL (ref 0.61–1.24)
GFR, Estimated: >60 mL/min (ref >60)
Glucose, Bld: 74 mg/dL (ref 70–99)
Potassium: 3.8 mmol/L (ref 3.5–5.1)
Sodium: 139 mmol/L (ref 135–145)
Total Bilirubin: 1.1 mg/dL (ref 0.3–1.2)
Total Protein: 7.1 g/dL (ref 6.5–8.1)

## 2021-07-29 LAB — LIPID PANEL
Cholesterol: 259 mg/dL — ABNORMAL HIGH (ref 0–200)
HDL: >135 mg/dL (ref >40)
Triglycerides: 62 mg/dL (ref <150)
VLDL: 12 mg/dL (ref 0–40)

## 2021-07-29 LAB — CBC WITH DIFFERENTIAL/PLATELET
Abs Immature Granulocytes: 0.02 10*3/uL (ref 0.00–0.07)
Basophils Absolute: 0 10*3/uL (ref 0.0–0.1)
Basophils Relative: 0 %
Eosinophils Absolute: 0.1 10*3/uL (ref 0.0–0.5)
Eosinophils Relative: 2 %
HCT: 48.6 % (ref 39.0–52.0)
Hemoglobin: 16.5 g/dL (ref 13.0–17.0)
Immature Granulocytes: 0 %
Lymphocytes Relative: 40 %
Lymphs Abs: 2 10*3/uL (ref 0.7–4.0)
MCH: 30.3 pg (ref 26.0–34.0)
MCHC: 34 g/dL (ref 30.0–36.0)
MCV: 89.2 fL (ref 80.0–100.0)
Monocytes Absolute: 0.3 10*3/uL (ref 0.1–1.0)
Monocytes Relative: 6 %
Neutro Abs: 2.5 10*3/uL (ref 1.7–7.7)
Neutrophils Relative %: 52 %
Platelets: 289 10*3/uL (ref 150–400)
RBC: 5.45 MIL/uL (ref 4.22–5.81)
RDW: 15 % (ref 11.5–15.5)
WBC: 4.8 10*3/uL (ref 4.0–10.5)
nRBC: 0 % (ref 0.0–0.2)

## 2021-07-29 LAB — RESP PANEL BY RT-PCR (FLU A&B, COVID) ARPGX2
Influenza A by PCR: NEGATIVE
Influenza B by PCR: NEGATIVE
SARS Coronavirus 2 by RT PCR: NEGATIVE

## 2021-07-29 LAB — POCT URINE DRUG SCREEN - MANUAL ENTRY (I-SCREEN)
POC Amphetamine UR: NOT DETECTED
POC Buprenorphine (BUP): NOT DETECTED
POC Cocaine UR: NOT DETECTED
POC Marijuana UR: POSITIVE — AB
POC Methadone UR: NOT DETECTED
POC Methamphetamine UR: NOT DETECTED
POC Morphine: NOT DETECTED
POC Oxazepam (BZO): NOT DETECTED
POC Oxycodone UR: NOT DETECTED
POC Secobarbital (BAR): NOT DETECTED

## 2021-07-29 LAB — HEMOGLOBIN A1C
Hgb A1c MFr Bld: 5.4 % (ref 4.8–5.6)
Mean Plasma Glucose: 108.28 mg/dL

## 2021-07-29 LAB — URINALYSIS, COMPLETE (UACMP) WITH MICROSCOPIC
Bacteria, UA: NONE SEEN
Bilirubin Urine: NEGATIVE
Glucose, UA: NEGATIVE mg/dL
Hgb urine dipstick: NEGATIVE
Ketones, ur: NEGATIVE mg/dL
Leukocytes,Ua: NEGATIVE
Nitrite: NEGATIVE
Protein, ur: NEGATIVE mg/dL
Specific Gravity, Urine: 1.005 (ref 1.005–1.030)
pH: 5 (ref 5.0–8.0)

## 2021-07-29 LAB — ETHANOL: Alcohol, Ethyl (B): 277 mg/dL — ABNORMAL HIGH (ref <10)

## 2021-07-29 LAB — MAGNESIUM: Magnesium: 1.6 mg/dL — ABNORMAL LOW (ref 1.7–2.4)

## 2021-07-29 LAB — HIV ANTIBODY (ROUTINE TESTING W REFLEX): HIV Screen 4th Generation wRfx: NONREACTIVE

## 2021-07-29 LAB — POC SARS CORONAVIRUS 2 AG: SARSCOV2ONAVIRUS 2 AG: NEGATIVE

## 2021-07-29 LAB — POC SARS CORONAVIRUS 2 AG -  ED: SARS Coronavirus 2 Ag: NEGATIVE

## 2021-07-29 MED ORDER — LOPERAMIDE HCL 2 MG PO CAPS: 2.0000 mg | ORAL_CAPSULE | ORAL | Status: DC | PRN

## 2021-07-29 MED ORDER — THIAMINE HCL 100 MG PO TABS
100.0000 mg | ORAL_TABLET | Freq: Every day | ORAL | Status: DC
  Administered 2021-07-29 – 2021-08-03 (×6): 100 mg via ORAL
  Filled 2021-07-29 (×6): qty 1

## 2021-07-29 MED ORDER — HYDROXYZINE HCL 25 MG PO TABS
25.0000 mg | ORAL_TABLET | Freq: Four times a day (QID) | ORAL | Status: AC | PRN
  Administered 2021-07-29: 25 mg via ORAL
  Filled 2021-07-29: qty 1

## 2021-07-29 MED ORDER — ONDANSETRON 4 MG PO TBDP: 4.0000 mg | ORAL_TABLET | Freq: Four times a day (QID) | ORAL | Status: DC | PRN

## 2021-07-29 MED ORDER — ALUM & MAG HYDROXIDE-SIMETH 200-200-20 MG/5ML PO SUSP: 30.0000 mL | ORAL | Status: DC | PRN

## 2021-07-29 MED ORDER — ADULT MULTIVITAMIN W/MINERALS CH
1.0000 | ORAL_TABLET | Freq: Every day | ORAL | Status: DC
  Administered 2021-07-29 – 2021-08-03 (×7): 1 via ORAL
  Filled 2021-07-29 (×7): qty 1

## 2021-07-29 MED ORDER — ACETAMINOPHEN 325 MG PO TABS
650.0000 mg | ORAL_TABLET | Freq: Four times a day (QID) | ORAL | Status: DC | PRN
  Administered 2021-07-30 – 2021-08-03 (×4): 650 mg via ORAL
  Filled 2021-07-29 (×4): qty 2

## 2021-07-29 MED ORDER — TRAZODONE HCL 50 MG PO TABS: 50.0000 mg | ORAL_TABLET | Freq: Every evening | ORAL | Status: DC | PRN

## 2021-07-29 MED ORDER — MAGNESIUM HYDROXIDE 400 MG/5ML PO SUSP
30.0000 mL | Freq: Every day | ORAL | Status: DC | PRN
Start: 2021-07-29 — End: 2021-08-04
  Administered 2021-08-01: 30 mL via ORAL
  Filled 2021-07-29: qty 30

## 2021-07-29 MED ORDER — LORAZEPAM 1 MG PO TABS
1.0000 mg | ORAL_TABLET | Freq: Four times a day (QID) | ORAL | Status: DC | PRN
  Administered 2021-07-29 – 2021-07-30 (×3): 1 mg via ORAL
  Filled 2021-07-29 (×3): qty 1

## 2021-07-29 NOTE — ED Provider Notes (Signed)
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 COVID POC and PCR.   EKG ordered  Ativan detox protocol with CIWA initiated. (Ativan Not scheduled, PRN)  Ardis Hughs,  NP 07/29/21  3:40 PM

## 2021-07-29 NOTE — ED Notes (Signed)
Pt resting at present, no distress noted, SI, contracts for safety.  Monitoring for safety.
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
## 2021-07-29 NOTE — Progress Notes (Signed)
CIWA-3

## 2021-07-29 NOTE — ED Notes (Signed)
Pt had a 2200 medication multivitamin with minerals tablet 1 tab. I administered the multivitamin with minerals tab but I scanned it twice, due to it not showing up when scanned it the first time.

## 2021-07-29 NOTE — ED Notes (Signed)
Patient admitted to unit.  He is calm but fearful with paranoid ideas about someone trying to harm him.  He required verbal support and reassurance.  He is on a detox protocol and will be monitored for withdrawal symptoms.

## 2021-07-29 NOTE — ED Notes (Signed)
Pt sleeping@this time. Breathing even and unlabored. Will continue to monitor for safety 

## 2021-07-30 ENCOUNTER — Encounter (HOSPITAL_COMMUNITY): Payer: Self-pay | Admitting: Psychiatry

## 2021-07-30 DIAGNOSIS — F332 Major depressive disorder, recurrent severe without psychotic features: Secondary | ICD-10-CM | POA: Diagnosis not present

## 2021-07-30 DIAGNOSIS — R45851 Suicidal ideations: Secondary | ICD-10-CM

## 2021-07-30 DIAGNOSIS — F1994 Other psychoactive substance use, unspecified with psychoactive substance-induced mood disorder: Secondary | ICD-10-CM | POA: Diagnosis not present

## 2021-07-30 DIAGNOSIS — F4323 Adjustment disorder with mixed anxiety and depressed mood: Secondary | ICD-10-CM | POA: Diagnosis not present

## 2021-07-30 LAB — TSH: TSH: 0.545 u[IU]/mL (ref 0.350–4.500)

## 2021-07-30 LAB — RPR: RPR Ser Ql: NONREACTIVE

## 2021-07-30 MED ORDER — ADULT MULTIVITAMIN W/MINERALS CH: 1.0000 | ORAL_TABLET | Freq: Every day | ORAL | Status: DC

## 2021-07-30 MED ORDER — LORAZEPAM 1 MG PO TABS
1.0000 mg | ORAL_TABLET | Freq: Two times a day (BID) | ORAL | Status: AC
  Administered 2021-08-02 (×2): 1 mg via ORAL
  Filled 2021-07-30 (×2): qty 1

## 2021-07-30 MED ORDER — THIAMINE HCL 100 MG PO TABS: 100.0000 mg | ORAL_TABLET | Freq: Every day | ORAL | Status: DC

## 2021-07-30 MED ORDER — AMLODIPINE BESYLATE 5 MG PO TABS
5.0000 mg | ORAL_TABLET | Freq: Every day | ORAL | Status: DC
  Administered 2021-07-30 – 2021-08-03 (×5): 5 mg via ORAL
  Filled 2021-07-30 (×5): qty 1
  Filled 2021-07-30: qty 14

## 2021-07-30 MED ORDER — THIAMINE HCL 100 MG/ML IJ SOLN
100.0000 mg | Freq: Once | INTRAMUSCULAR | Status: AC
  Administered 2021-07-30: 100 mg via INTRAMUSCULAR
  Filled 2021-07-30: qty 2

## 2021-07-30 MED ORDER — HYDROXYZINE HCL 25 MG PO TABS: 25.0000 mg | ORAL_TABLET | Freq: Four times a day (QID) | ORAL | Status: DC | PRN

## 2021-07-30 MED ORDER — LORAZEPAM 1 MG PO TABS
1.0000 mg | ORAL_TABLET | Freq: Four times a day (QID) | ORAL | Status: AC
  Administered 2021-07-30 – 2021-07-31 (×5): 1 mg via ORAL
  Filled 2021-07-30 (×6): qty 1

## 2021-07-30 MED ORDER — ONDANSETRON 4 MG PO TBDP: 4.0000 mg | ORAL_TABLET | Freq: Four times a day (QID) | ORAL | Status: AC | PRN

## 2021-07-30 MED ORDER — LOPERAMIDE HCL 2 MG PO CAPS: 2.0000 mg | ORAL_CAPSULE | ORAL | Status: AC | PRN

## 2021-07-30 MED ORDER — SERTRALINE HCL 50 MG PO TABS
50.0000 mg | ORAL_TABLET | Freq: Every day | ORAL | Status: DC
  Administered 2021-07-30 – 2021-08-03 (×5): 50 mg via ORAL
  Filled 2021-07-30 (×5): qty 1
  Filled 2021-07-30: qty 14

## 2021-07-30 MED ORDER — LORAZEPAM 1 MG PO TABS: 1.0000 mg | ORAL_TABLET | Freq: Four times a day (QID) | ORAL | Status: AC | PRN

## 2021-07-30 MED ORDER — LORAZEPAM 1 MG PO TABS
1.0000 mg | ORAL_TABLET | Freq: Once | ORAL | Status: AC
  Administered 2021-07-30: 1 mg via ORAL
  Filled 2021-07-30: qty 1

## 2021-07-30 MED ORDER — LORAZEPAM 1 MG PO TABS
1.0000 mg | ORAL_TABLET | Freq: Every day | ORAL | Status: AC
  Administered 2021-08-03: 1 mg via ORAL
  Filled 2021-07-30: qty 1

## 2021-07-30 MED ORDER — LORAZEPAM 1 MG PO TABS
1.0000 mg | ORAL_TABLET | Freq: Three times a day (TID) | ORAL | Status: AC
  Administered 2021-08-01 (×3): 1 mg via ORAL
  Filled 2021-07-30 (×3): qty 1
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
## 2021-07-30 NOTE — ED Notes (Signed)
Pt sleeping@this time. Breathing even and unlabored. Will continue to monitor for safety 
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
## 2021-07-30 NOTE — ED Notes (Signed)
Pt was awake and alert this morning. Oriented to person, place and situation.  Unsure of time.   Denies SI, HI, or AVH.    Pt was visibly tremulous and CIWA was 14.   Given prn dose of ativan for ETOH withdrawal.  Will continue to monitor for safety.

## 2021-07-30 NOTE — ED Notes (Signed)
Pt asleep in bed. Respirations even and unlabored. Will continue to monitor for safety. ?

## 2021-07-30 NOTE — ED Notes (Signed)
Pt to be transferred to [facility]. Report called to [employer]. Escorted by Staff.

## 2021-07-30 NOTE — ED Notes (Signed)
Pt is resting comfortably.  No obvious signs of withdrawal noted.  Staff has continued to encourage PO fluid intake.  Will continue to monitor for safety.

## 2021-07-31 DIAGNOSIS — F1994 Other psychoactive substance use, unspecified with psychoactive substance-induced mood disorder: Secondary | ICD-10-CM | POA: Diagnosis not present

## 2021-07-31 DIAGNOSIS — F4323 Adjustment disorder with mixed anxiety and depressed mood: Secondary | ICD-10-CM | POA: Diagnosis not present

## 2021-07-31 DIAGNOSIS — R45851 Suicidal ideations: Secondary | ICD-10-CM | POA: Diagnosis not present

## 2021-07-31 DIAGNOSIS — F332 Major depressive disorder, recurrent severe without psychotic features: Secondary | ICD-10-CM | POA: Diagnosis not present

## 2021-07-31 MED ORDER — INDOMETHACIN 25 MG PO CAPS
25.0000 mg | ORAL_CAPSULE | Freq: Three times a day (TID) | ORAL | Status: DC | PRN
  Filled 2021-07-31: qty 15

## 2021-07-31 MED ORDER — TRAZODONE HCL 50 MG PO TABS
50.0000 mg | ORAL_TABLET | Freq: Every evening | ORAL | Status: DC | PRN
  Administered 2021-07-31 – 2021-08-02 (×5): 50 mg via ORAL
  Filled 2021-07-31 (×5): qty 1

## 2021-07-31 MED ORDER — GABAPENTIN 100 MG PO CAPS
100.0000 mg | ORAL_CAPSULE | Freq: Three times a day (TID) | ORAL | Status: DC
  Administered 2021-07-31 – 2021-08-01 (×4): 100 mg via ORAL
  Filled 2021-07-31 (×4): qty 1

## 2021-07-31 NOTE — Progress Notes (Signed)
Pt is resting quietly in his room. No distress noted. Monitoring for pt's safety.

## 2021-07-31 NOTE — ED Notes (Addendum)
Pt is awake, alert and oriented. Pt endorses SI with plan to hang himself,HI,AVH seeing people and hearing voices telling him its not over and hearing  a voice telling him to think about his grand kids and not harm himself. Emotional support given by nurse. Patient contracts to safety while on the unit. Respiration is even and unlabored. No acute distress noted. Will continue to monitor for safety

## 2021-07-31 NOTE — ED Notes (Addendum)
Pt came to nurses station asking for some medication to help him sleep, pt was advised that the only medication he has on file is lorazepam and gabapentin that is due at 10 pm (the earliest he can have it is at 9pm) This nurse advised the patient that I will reach out to the provider to see if he can order trazodone for the patient.

## 2021-07-31 NOTE — ED Notes (Signed)
Patient was encouraged by staff to attend wrap up group, patient declined due to him saying he do not feeling well. Staff continued to check on the patient for his safety throughout the night.
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
## 2021-07-31 NOTE — ED Notes (Signed)
Pt did not attend group. 

## 2021-07-31 NOTE — ED Notes (Signed)
A secure message was sent to provider on staff per patients request about order for trazodone.

## 2021-07-31 NOTE — ED Provider Notes (Addendum)
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 revoked.  Performed at [facility], [place] [address], [place], [place] [address]    WBC 07/06/2021 5.7  4.0 - 10.5 K/uL Final   RBC 07/06/2021 5.37  4.22 - 5.81 MIL/uL Final   Hemoglobin 07/06/2021 15.7  13.0 - 17.0 g/dL Final   HCT 07/06/2021 48.5  39.0 - 52.0 % Final   MCV 07/06/2021 90.3  80.0 - 100.0 fL Final   MCH 07/06/2021 29.2  26.0 - 34.0 pg Final   MCHC 07/06/2021 32.4  30.0 - 36.0 g/dL  Final   RDW 07/06/2021 14.5  11.5 - 15.5 % Final   Platelets 07/06/2021 307  150 - 400 K/uL Final   nRBC 07/06/2021 0.0  0.0 - 0.2 % Final   Neutrophils Relative % 07/06/2021 53  % Final   Neutro Abs 07/06/2021 3.1  1.7 - 7.7 K/uL Final   Lymphocytes Relative 07/06/2021 35  % Final   Lymphs Abs 07/06/2021 2.0  0.7 - 4.0 K/uL Final   Monocytes Relative 07/06/2021 7  % Final   Monocytes Absolute 07/06/2021 0.4  0.1 - 1.0 K/uL Final   Eosinophils Relative 07/06/2021 3  % Final   Eosinophils Absolute  07/06/2021 0.2  0.0 - 0.5 K/uL Final   Basophils Relative 07/06/2021 1  % Final   Basophils Absolute 07/06/2021 0.0  0.0 - 0.1 K/uL Final   Immature Granulocytes 07/06/2021 1  % Final   Abs Immature Granulocytes 07/06/2021 0.06  0.00 - 0.07 K/uL Final   Performed at [facility], [place] [address], [place], [place] [address]   Color, Urine 07/06/2021 STRAW (A)  YELLOW Final   APPearance 07/06/2021 CLEAR  CLEAR Final   Specific Gravity, Urine 07/06/2021 1.010  1.005 - 1.030 Final   pH 07/06/2021 5.0  5.0 - 8.0 Final   Glucose, UA 07/06/2021 NEGATIVE  NEGATIVE mg/dL Final   Hgb urine dipstick 07/06/2021 NEGATIVE  NEGATIVE Final   Bilirubin Urine 07/06/2021 NEGATIVE  NEGATIVE Final   Ketones, ur 07/06/2021 NEGATIVE  NEGATIVE mg/dL Final   Protein, ur 07/06/2021 NEGATIVE  NEGATIVE mg/dL Final   Nitrite 07/06/2021 NEGATIVE  NEGATIVE Final   Leukocytes,Ua 07/06/2021 NEGATIVE  NEGATIVE Final   Performed at [facility], [place] [address], [place], [place] [address]  Admission on 02/21/2021, Discharged on 02/22/2021  Component Date Value Ref Range Status   WBC 02/21/2021 6.5  4.0 - 10.5 K/uL Final   RBC 02/21/2021 4.61  4.22 - 5.81 MIL/uL Final   Hemoglobin 02/21/2021 13.3  13.0 - 17.0 g/dL Final   HCT 02/21/2021 40.4  39.0 - 52.0 % Final   MCV 02/21/2021 87.6  80.0 - 100.0 fL Final   MCH 02/21/2021 28.9  26.0 - 34.0 pg Final   MCHC 02/21/2021 32.9  30.0 - 36.0 g/dL Final   RDW 02/21/2021  12.7  11.5 - 15.5 % Final   Platelets 02/21/2021 605 (H)  150 - 400 K/uL Final   nRBC 02/21/2021 0.0  0.0 - 0.2 % Final   Neutrophils Relative % 02/21/2021 49  % Final   Neutro Abs 02/21/2021 3.2  1.7 - 7.7 K/uL Final   Lymphocytes Relative 02/21/2021 43  % Final   Lymphs Abs 02/21/2021 2.8  0.7 - 4.0 K/uL Final   Monocytes Relative 02/21/2021 6  % Final   Monocytes Absolute 02/21/2021 0.4  0.1 - 1.0 K/uL Final   Eosinophils Relative 02/21/2021 1  % Final   Eosinophils Absolute 02/21/2021 0.1  0.0 - 0.5 K/uL Final   Basophils Relative 02/21/2021 0  % Final   Basophils Absolute 02/21/2021 0.0  0.0 - 0.1 K/uL Final   Immature Granulocytes 02/21/2021 1  % Final   Abs Immature Granulocytes 02/21/2021 0.04  0.00 - 0.07 K/uL Final   Performed at [facility], [place] [address] [place], [place] [address]   Sodium 02/21/2021 140  135 - 145 mmol/L Final   Potassium 02/21/2021 3.2 (L)  3.5 - 5.1 mmol/L Final   Chloride 02/21/2021 105  98 - 111 mmol/L Final   CO2 02/21/2021 22  22 - 32 mmol/L Final   Glucose, Bld 02/21/2021 102 (H)  70 - 99 mg/dL Final   Glucose reference range applies only to samples taken after fasting for at least 8 hours.   BUN 02/21/2021 10  8 - 23 mg/dL Final   Creatinine, Ser 02/21/2021 0.80  0.61 - 1.24 mg/dL Final   Calcium 02/21/2021 9.5  8.9 - 10.3 mg/dL Final   GFR, Estimated 02/21/2021 >60  >60 mL/min Final   Comment: (NOTE) Calculated using the CKD-EPI Creatinine Equation (2021)    Anion gap 02/21/2021 13  5 - 15 Final   Performed  at [facility], [place] [address], [place], [place] [address]   Specimen Description 02/22/2021    Final                   Value:JOINT FLUID RIGHT ELBOW Performed at [facility], [place] [address], [place], [place] [address]    Special Requests 02/22/2021    Final                   Value:Normal Performed at [facility], [place] [address] [place], [place] [address]     Gram Stain 02/22/2021    Final                   Value:RARE WBC PRESENT,BOTH PMN AND MONONUCLEAR NO ORGANISMS SEEN    Culture 02/22/2021    Final                   Value:NO GROWTH 3 DAYS Performed at [facility], [place] [address], [place], [place] [address]    Report Status 02/22/2021 02/25/2021 FINAL   Final   Color, Synovial 02/22/2021 RED (A)  YELLOW Final   Appearance-Synovial 02/22/2021 TURBID (A)  CLEAR Final   Crystals, Fluid 02/22/2021 EXTRACELLULAR MONOSODIUM URATE CRYSTALS   Final   WBC, Synovial 02/22/2021 7,070 (H)  0 - 200 /cu mm Final   Neutrophil, Synovial 02/22/2021 55 (H)  0 - 25 % Final   Lymphocytes-Synovial Fld 02/22/2021 6  0 - 20 % Final   Monocyte-Macrophage-Synovial Fluid 02/22/2021 39 (L)  50 - 90 % Final   Eosinophils-Synovial 02/22/2021 0  0 - 1 % Final   Other Cells-SYN 02/22/2021 OTHER CELLS IDENTIFIED AS SYNOVIAL LINING CELLS   Final   Performed at [facility], [place] [address], [place],  [address]  Admission on 02/09/2021, Discharged on 02/10/2021  Component Date Value Ref Range Status   Lactic Acid, Venous 02/10/2021 1.1  0.5 - 1.9 mmol/L Final   Performed at [facility], [place] [address], [place], [place] [address]   Lactic Acid, Venous 02/09/2021 0.8  0.5 - 1.9 mmol/L Final   Performed at [place] [address], [place], [place] [address]   Sodium 02/09/2021 136  135 - 145 mmol/L Final   Potassium 02/09/2021 3.2 (L)  3.5 - 5.1 mmol/L Final   Chloride 02/09/2021 98  98 - 111 mmol/L Final   CO2 02/09/2021 27  22 - 32 mmol/L Final   Glucose, Bld 02/09/2021 103 (H)  70 - 99 mg/dL Final   Glucose reference range applies only to samples taken after fasting for at least 8 hours.   BUN 02/09/2021 12  8 - 23 mg/dL Final   Creatinine, Ser 02/09/2021 0.81  0.61 - 1.24 mg/dL Final   Calcium 02/09/2021 9.1  8.9 - 10.3 mg/dL Final   Total Protein 02/09/2021 7.3  6.5 - 8.1 g/dL Final    Albumin 02/09/2021 3.4 (L)  3.5 - 5.0 g/dL Final   AST 02/09/2021 13 (L)  15 - 41 U/L Final   ALT 02/09/2021 13  0 - 44 U/L Final   Alkaline Phosphatase 02/09/2021 60  38 - 126 U/L Final   Total Bilirubin 02/09/2021 1.1  0.3 - 1.2 mg/dL Final   GFR, Estimated 02/09/2021 >60  >60 mL/min Final   Comment: (NOTE) Calculated using the CKD-EPI Creatinine Equation (2021)    Anion gap 02/09/2021 11  5 - 15 Final  Performed at [facility], [place] [address], [place], [place] [address]   WBC 02/09/2021 12.8 (H)  4.0 - 10.5 K/uL Final   RBC 02/09/2021 4.31  4.22 - 5.81 MIL/uL Final   Hemoglobin 02/09/2021 12.7 (L)  13.0 - 17.0 g/dL Final   HCT 02/09/2021 37.8 (L)  39.0 - 52.0 % Final   MCV 02/09/2021 87.7  80.0 - 100.0 fL Final   MCH 02/09/2021 29.5  26.0 - 34.0 pg Final   MCHC 02/09/2021 33.6  30.0 - 36.0 g/dL Final   RDW 02/09/2021 12.3  11.5 - 15.5 % Final   Platelets 02/09/2021 368  150 - 400 K/uL Final   nRBC 02/09/2021 0.0  0.0 - 0.2 % Final   Neutrophils Relative % 02/09/2021 75  % Final   Neutro Abs 02/09/2021 9.6 (H)  1.7 - 7.7 K/uL Final   Lymphocytes Relative 02/09/2021 16  % Final   Lymphs Abs 02/09/2021 2.1  0.7 - 4.0 K/uL Final   Monocytes Relative 02/09/2021 8  % Final   Monocytes Absolute 02/09/2021 1.0  0.1 - 1.0 K/uL Final   Eosinophils Relative 02/09/2021 0  % Final   Eosinophils Absolute 02/09/2021 0.0  0.0 - 0.5 K/uL Final   Basophils Relative 02/09/2021 0  % Final   Basophils Absolute 02/09/2021 0.0  0.0 - 0.1 K/uL Final   Immature Granulocytes 02/09/2021 1  % Final   Abs Immature Granulocytes 02/09/2021 0.09 (H)  0.00 - 0.07 K/uL Final   Performed at [facility], [place] [address], [place], [place] [address]   Color, Urine 02/10/2021 YELLOW  YELLOW Final   APPearance 02/10/2021 CLEAR  CLEAR Final   Specific Gravity, Urine 02/10/2021 1.015  1.005 - 1.030 Final   pH 02/10/2021 6.0  5.0 - 8.0 Final   Glucose, UA 02/10/2021 NEGATIVE   NEGATIVE mg/dL Final   Hgb urine dipstick 02/10/2021 NEGATIVE  NEGATIVE Final   Bilirubin Urine 02/10/2021 NEGATIVE  NEGATIVE Final   Ketones, ur 02/10/2021 20 (A)  NEGATIVE mg/dL Final   Protein, ur 02/10/2021 NEGATIVE  NEGATIVE mg/dL Final   Nitrite 02/10/2021 NEGATIVE  NEGATIVE Final   Leukocytes,Ua 02/10/2021 NEGATIVE  NEGATIVE Final   Performed at [place] [address], [place], [place] [address]   Specimen Description 02/09/2021    Final                   Value:BLOOD LEFT ANTECUBITAL Performed at [place] [address], [place], [place] [address]    Special Requests 02/09/2021    Final                   Value:BOTTLES DRAWN AEROBIC AND ANAEROBIC Blood Culture adequate volume Performed at [place] [address] [place], [place] [address]    Culture 02/09/2021    Final                   Value:NO GROWTH 5 DAYS Performed at [place] [address] [place], [place] [address]    Report Status 02/09/2021 02/15/2021 FINAL   Final   Specimen Description 02/09/2021    Final                   Value:BLOOD LEFT ANTECUBITAL Performed at [facility], [place] [address], [place], [place] [address]    Special Requests 02/09/2021    Final  Value:BOTTLES DRAWN AEROBIC AND ANAEROBIC Blood Culture adequate volume Performed at [place] [address], [place], [place] [address]    Culture 02/09/2021    Final                   Value:NO GROWTH 5 DAYS Performed at [place] [address] [place], [place] [address]    Report Status 02/09/2021 02/15/2021 FINAL   Final   Sed Rate 02/09/2021 89 (H)  0 - 16 mm/hr Final   Performed at [facility], [place] [address], [place], [place] [address]   CRP 02/09/2021 14.7 (H)  <1.0 mg/dL Final   Performed at [place] [address], [place], [place] [address]     Blood Alcohol level:  Lab Results  Component Value Date   ETH 277 (H) 07/29/2021   ETH 283 (H) [id number]    Metabolic Disorder Labs: Lab Results  Component Value Date   HGBA1C 5.4 07/29/2021   MPG 108.28 07/29/2021   MPG 108.28 11/26/2020   No results found for: PROLACTIN Lab Results  Component Value Date   CHOL 259 (H) 07/29/2021   TRIG 62 07/29/2021   HDL >135 07/29/2021   CHOLHDL NOT CALCULATED 07/29/2021   VLDL 12 07/29/2021   LDLCALC NOT CALCULATED 07/29/2021    Therapeutic Lab Levels: No results found for: LITHIUM No results found for: VALPROATE No components found for:  CBMZ  Physical Findings   Flowsheet Row ED from 07/29/2021 in [facility] from 07/06/2021 in [facility] from 06/23/2021 in [facility] DEPT  C-SSRS RISK CATEGORY Low Risk High Risk No Risk        Musculoskeletal  Strength & Muscle Tone: within normal limits Gait & Station: normal Patient leans: N/A  Psychiatric Specialty Exam  Presentation  General Appearance: Appropriate for Environment; Casual  Eye Contact:Good  Speech:Clear and Coherent; Normal Rate  Speech Volume:Normal  Handedness:Right   Mood and Affect  Mood:-- ("awful"; 0/10)  Affect:Appropriate; Congruent; Other (comment) (dysphoric)   Thought Process  Thought Processes:Coherent; Goal Directed; Linear  Descriptions of Associations:Intact  Orientation:Full (Time, Place and Person)  Thought Content:WDL; Logical     Hallucinations:Hallucinations: None  Ideas of Reference:None  Suicidal Thoughts:Suicidal Thoughts: Yes, Passive SI Active Intent and/or Plan: With Intent; With Plan  Homicidal Thoughts:Homicidal Thoughts: Yes, Passive (describes as being angry as a cowoeker, no plan or intent) HI Passive Intent and/or Plan: Without Plan; Without Intent   Sensorium  Memory:Immediate Good; Recent Good;  Remote Good  Judgment:Intact  Insight:Fair   Executive Functions  Concentration:Good  Attention Span:Good  [place] of Knowledge:Good  Language:Good   Psychomotor Activity  Psychomotor Activity:Psychomotor Activity: Tremor   Assets  Assets:Communication Skills; Desire for Improvement; Social Support; Housing   Sleep  Sleep:Sleep: Poor   No data recorded  Physical Exam  Physical Exam Constitutional:      Appearance: Normal appearance. He is normal weight.  HENT:     Head: Normocephalic and atraumatic.  Eyes:     Extraocular Movements: Extraocular movements intact.  Cardiovascular:     Rate and Rhythm: Normal rate and regular rhythm.     Heart sounds: Normal heart sounds.  Pulmonary:     Effort: Pulmonary effort is normal.     Breath sounds: Normal breath sounds.  Abdominal:     General: Abdomen is flat. Bowel sounds are normal.  Palpations: Abdomen is soft.  Neurological:     General: No focal deficit present.     Mental Status: He is alert and oriented to person, place, and time.     Comments: tremulous  Psychiatric:        Attention and Perception: Attention and perception normal.        Speech: Speech normal.        Behavior: Behavior is cooperative.   Review of Systems  Constitutional:  Negative for chills and fever.  HENT:  Negative for hearing loss.   Eyes:  Negative for discharge and redness.  Respiratory:  Negative for cough.   Cardiovascular:  Negative for chest pain.  Gastrointestinal:  Positive for abdominal pain and nausea. Negative for vomiting.  Musculoskeletal:  Negative for myalgias.  Neurological:  Positive for tremors and headaches.  Psychiatric/Behavioral:  Positive for depression and substance abuse. The patient has insomnia.   Blood pressure (!) 142/86, pulse 72, temperature 97.7 F (36.5 C), temperature source Temporal, resp. rate 18, SpO2 98 %. There is no height or weight on file to calculate BMI.  Treatment  Plan Summary: 60 year old male with past reported psychiatric history of alcohol use disorder and depression who presented to the [facility] on [facility]15 with chief complaint of suicidal thoughts and alcohol use in the setting of recent relapse (vodka) on Friday 11/11; last drink 11/15.  [facility] on 11/16 for continued treatment of mood and alcohol detox.  Patient started on alcohol detox protocol and Ativan taper on 11/16.  Today patient reporting alcohol withdrawal symptoms of tremors, headache, cramps, nausea and GI upset.  Patient remains appropriate for continued admission to the [facility] for alcohol detox.  Consulted with pharmacy who confirms that medications that are listed as home medications are correct.  Pharmacist reported that patient stated that he was previously prescribed Norvasc but had not taken recently.    AUD, severe Alcohol Withdrawal, uncomplicated -Ativan taper-1 mg Ativan 4 times daily followed by 1 mg Ativan 3 times daily followed by 1 mg Ativan twice daily followed by 1 mg Ativan times once -CIWA protocol -PRN medications to assist with alcohol withdrawal sx -started gabapentin 100 mg tID (11/17) for alcohol withdrawal/cravings; pt reported back pain which gabapentin may also assist with  MDD R/o SIMD -zoloft 25 mg (started 11/16); will titrate as deemed appropriate/clinically indicated  Hypertension -continue amlodipine 5 mg; consider titrating doses clinically indicated/appropriate  Gout Indomethocin 25 mg TID PRN  Dispo: Ongoing.  Social work Sports administrator.  Ativan taper completing Sunday November 20. Possible residential rehab   Ival Bible, MD 07/31/2021 11:59 AM

## 2021-07-31 NOTE — Clinical Social Work Psych Note (Addendum)
Health Support Systems  Healthy Support Systems & Self Advocacy  Date: 07/31/21  Type of Therapy/Therapeutic Modalities: Solution-Focused, Motivational Interviewing, Supportive   Participation Level: None  Objective: To assist patients in learning effective communication skills to express their needs to respective support and regulatory systems appropriately. Facilitators will challenge patients to identify positive influences in their life to provide insight on healthy relationships that could provide support in the event of a crisis and recovery.   Therapeutic Goals:  Patient will discuss how they identify a healthy support system.  Patient will identify current and potential support people and/or systems in their life currently.  Patient will discuss setting healthy boundaries and conflict resolution skills to maintain support systems.  Patient will reflect on how they can strengthen their support system when you get out of the hospital.  Summary of Patient's Progress:  Keller did not participate in the group's discussion. He shared that he did not feel well and continues to experience intense withdrawal symptoms.

## 2021-07-31 NOTE — Progress Notes (Signed)
Willie Collins is sleeping without distress in his room.

## 2021-07-31 NOTE — ED Notes (Signed)
Pt asleep in bed. Respirations even and unlabored. Will continue to monitor for safety. ?

## 2021-07-31 NOTE — ED Notes (Signed)
Pt is in the bed sleeping at present. Respirations are even and unlabored. No acute distress noted. Will continue to monitor for safety. °

## 2021-07-31 NOTE — ED Notes (Signed)
Pt is currently at the medication window getting his medications.

## 2021-07-31 NOTE — ED Notes (Signed)
Pt is in dining room eating a snack. 

## 2021-08-01 DIAGNOSIS — R45851 Suicidal ideations: Secondary | ICD-10-CM | POA: Diagnosis not present

## 2021-08-01 DIAGNOSIS — F4323 Adjustment disorder with mixed anxiety and depressed mood: Secondary | ICD-10-CM | POA: Diagnosis not present

## 2021-08-01 DIAGNOSIS — F332 Major depressive disorder, recurrent severe without psychotic features: Secondary | ICD-10-CM | POA: Diagnosis not present

## 2021-08-01 DIAGNOSIS — F1994 Other psychoactive substance use, unspecified with psychoactive substance-induced mood disorder: Secondary | ICD-10-CM | POA: Diagnosis not present

## 2021-08-01 MED ORDER — GABAPENTIN 100 MG PO CAPS
200.0000 mg | ORAL_CAPSULE | Freq: Three times a day (TID) | ORAL | Status: DC
  Administered 2021-08-01 – 2021-08-03 (×8): 200 mg via ORAL
  Filled 2021-08-01 (×5): qty 2
  Filled 2021-08-01: qty 84
  Filled 2021-08-01 (×3): qty 2

## 2021-08-01 NOTE — ED Notes (Signed)
Patient took his medication asked

## 2021-08-01 NOTE — ED Notes (Signed)
Pt eating dinner

## 2021-08-01 NOTE — ED Notes (Signed)
Pt is currently sleeping, no distress noted, environmental check complete, will continue to monitor patient for safety. ? ?

## 2021-08-01 NOTE — Clinical Social Work Psych Note (Signed)
CSW Update   Melo reports he "felt better than yesterday". He did share that he did not sleep well and experienced suicidal ideation and auditory hallucinations last night. Byford denied having any SI or AH at this time.   Patient was accepted to [facility] for residential substance abuse treatment on Monday,08/04/2021 at 9:00am. [facility] will require a 14-day supply of sampled medications, in addition to a 30 day prescription with one refill.   Patient will need to be transported via [employer].   CSW informed Jashun of his appointment on Monday. He was agreeable with his discharge plan.   Mana reports he does not have any additional questions or concerns at this time.   RN staff notified Dr. Bronwen Betters, MD notified.     Baldo Daub, MSW, LCSW Clinical Child psychotherapist (Facility Based Crisis) [facility]

## 2021-08-01 NOTE — ED Provider Notes (Signed)
Behavioral Health Progress Note  Date and Time: 08/01/2021 2:10 PM Name: Willie Collins MRN:  [id number]  Subjective:   60 year old male with past reported psychiatric history of alcohol use disorder and depression who presented to the [facility] on 11/15 with chief complaint of suicidal thoughts and alcohol use in the setting of recent relapse (vodka) on Friday 11/11; last drink 11/15.  Patient was transferred from the [facility] observation unit to the [facility] on 11/16 for continued treatment of mood and alcohol detox.  Patient started on alcohol detox protocol and Ativan taper on 11/16.   Patient seen and chart reviewed.  Patient has been medication compliant has not been an issue on the unit.  He has been appropriate with staff and peers.  Most recent CIWA score of 4.    Patient interviewed this morning in conjunction with PA student.  Patient describes his mood as "pretty good".  He states he slept well yesterday and attributes this to the trazodone.  Patient denies current SI/HI/AVH.  Patient states that yesterday he was experiencing some auditory hallucinations of voices that were telling him they were going to kill him.  On description of these auditory hallucinations, patient describes the voice he was hearing as being one of his of his coworkers threatening to harm him.  Patient also reported auditory hallucinations that tell him that he needs to drink.  Describes these hallucinations as only occurring at night.Reports some mild nausea, headache, abdominal discomfort, constipation.  He denies vomiting and sweating.  Patient states that he was unable to eat breakfast this morning and attributes this to the nausea.  Discussed increasing gabapentin to 200 mg 3 times daily to assist with alcohol cravings/withdrawal.  Patient is amenable.  Informed patient that he has been accepted by [facility].  Patient verbalizes understanding and expresses that he is looking forward to substance treatment.  No other concerns  reported at this time.  All questions answered.      Diagnosis:  Final diagnoses:  Substance induced mood disorder ([place])    Total Time spent with patient: 15 minutes  Past Psychiatric History: alcohol use disorder, depression, anxiety Past Medical History:  Past Medical History:  Diagnosis Date   Hypertension     Past Surgical History:  Procedure Laterality Date   IRRIGATION AND DEBRIDEMENT FOOT Left 11/25/2020   Procedure: IRRIGATION AND DEBRIDEMENT FOOT;  Surgeon: Felipa Furnace, DPM;  Location: [facility] ORS;  Service: Podiatry;  Laterality: Left;   Family History: No family history on file. Family Psychiatric  History: Father with alcoholism Sister with drug use (deceased) Sister diagnosed with bipolar disorder Social History:  Social History   Substance and Sexual Activity  Alcohol Use Yes   Comment: weekends     Social History   Substance and Sexual Activity  Drug Use Never    Social History   Socioeconomic History   Marital status: Single    Spouse name: Not on file   Number of children: Not on file   Years of education: Not on file   Highest education level: Not on file  Occupational History   Not on file  Tobacco Use   Smoking status: Never   Smokeless tobacco: Never  Vaping Use   Vaping Use: Never used  Substance and Sexual Activity   Alcohol use: Yes    Comment: weekends   Drug use: Never   Sexual activity: Yes    Partners: Female  Other Topics Concern   Not on file  Social History  Narrative   Not on file   Social Determinants of Health   Financial Resource Strain: Not on file  Food Insecurity: Not on file  Transportation Needs: Not on file  Physical Activity: Not on file  Stress: Not on file  Social Connections: Not on file   SDOH:  SDOH Screenings   Alcohol Screen: Not on file  Depression (PHQ2-9): Not on file  Financial Resource Strain: Not on file  Food Insecurity: Not on file  Housing: Not on file  Physical Activity: Not on  file  Social Connections: Not on file  Stress: Not on file  Tobacco Use: Low Risk    Smoking Tobacco Use: Never   Smokeless Tobacco Use: Never   Passive Exposure: Not on file  Transportation Needs: Not on file   Additional Social History:    Pain Medications: None History of alcohol / drug use?: Yes Longest period of sobriety (when/how long): 6 months Negative Consequences of Use: Financial, Personal relationships Withdrawal Symptoms: Tremors, Irritability, Nausea / Vomiting, Agitation Name of Substance 1: ETOH (Alcohol) 1 - Age of First Use: Unknown 1 - Amount (size/oz): Two fifths of alcohol 1 - Frequency: Daily 1 - Duration: "Many years" 1 - Last Use / Amount: Tuesday; 2 fifths of liquor 1 - Method of Aquiring: Purchases from store 1- Route of Use: Oral; Drinking                  Sleep: Poor  Appetite:  Fair  Current Medications:  Current Facility-Administered Medications  Medication Dose Route Frequency Provider Last Rate Last Admin   acetaminophen (TYLENOL) tablet 650 mg  650 mg Oral Q6H PRN Ardis Hughs, NP   650 mg at 07/31/21 0922   alum & mag hydroxide-simeth (MAALOX/MYLANTA) 200-200-20 MG/5ML suspension 30 mL  30 mL Oral Q4H PRN Ardis Hughs, NP       amLODipine (NORVASC) tablet 5 mg  5 mg Oral Daily Estella Husk, MD   5 mg at 08/01/21 1029   gabapentin (NEURONTIN) capsule 100 mg  100 mg Oral TID Estella Husk, MD   100 mg at 08/01/21 1030   hydrOXYzine (ATARAX/VISTARIL) tablet 25 mg  25 mg Oral Q6H PRN Ardis Hughs, NP   25 mg at 07/29/21 1827   indomethacin (INDOCIN) capsule 25 mg  25 mg Oral TID PRN Estella Husk, MD       loperamide (IMODIUM) capsule 2-4 mg  2-4 mg Oral PRN Estella Husk, MD       LORazepam (ATIVAN) tablet 1 mg  1 mg Oral Q6H PRN Estella Husk, MD       LORazepam (ATIVAN) tablet 1 mg  1 mg Oral TID Estella Husk, MD   1 mg at 08/01/21 1031   Followed by   Melene Muller ON 08/02/2021]  LORazepam (ATIVAN) tablet 1 mg  1 mg Oral BID Estella Husk, MD       Followed by   Melene Muller ON 08/03/2021] LORazepam (ATIVAN) tablet 1 mg  1 mg Oral Daily Estella Husk, MD       magnesium hydroxide (MILK OF MAGNESIA) suspension 30 mL  30 mL Oral Daily PRN Ardis Hughs, NP   30 mL at 08/01/21 1031   multivitamin with minerals tablet 1 tablet  1 tablet Oral Daily Ardis Hughs, NP   1 tablet at 08/01/21 1030   ondansetron (ZOFRAN-ODT) disintegrating tablet 4 mg  4 mg Oral Q6H PRN Estella Husk, MD  sertraline (ZOLOFT) tablet 50 mg  50 mg Oral Daily Penn, Cicely, NP   50 mg at 08/01/21 1030   thiamine tablet 100 mg  100 mg Oral Daily Thomes Lolling H, NP   100 mg at 08/01/21 1030   traZODone (DESYREL) tablet 50 mg  50 mg Oral QHS,MR X 1 Lindon Romp A, NP   50 mg at 07/31/21 2312   Current Outpatient Medications  Medication Sig Dispense Refill   ibuprofen (ADVIL) 200 MG tablet Take 400 mg by mouth every 6 (six) hours as needed (For pain).     indomethacin (INDOCIN) 25 MG capsule Take 1 capsule (25 mg total) by mouth 3 (three) times daily as needed. (Patient taking differently: Take 25 mg by mouth 3 (three) times daily as needed (For gout.).) 30 capsule 0   methocarbamol (ROBAXIN) 500 MG tablet Take 1 tablet (500 mg total) by mouth every 8 (eight) hours as needed for muscle spasms. 20 tablet 0   naproxen (NAPROSYN) 500 MG tablet Take 500 mg by mouth 2 (two) times daily with a meal.      Labs  Lab Results:  Admission on 07/29/2021  Component Date Value Ref Range Status   SARS Coronavirus 2 by RT PCR 07/29/2021 NEGATIVE  NEGATIVE Final   Comment: (NOTE) SARS-CoV-2 target nucleic acids are NOT DETECTED.  The SARS-CoV-2 RNA is generally detectable in upper respiratory specimens during the acute phase of infection. The lowest concentration of SARS-CoV-2 viral copies this assay can detect is 138 copies/mL. A negative result does not preclude  SARS-Cov-2 infection and should not be used as the sole basis for treatment or other patient management decisions. A negative result may occur with  improper specimen collection/handling, submission of specimen other than nasopharyngeal swab, presence of viral mutation(s) within the areas targeted by this assay, and inadequate number of viral copies(<138 copies/mL). A negative result must be combined with clinical observations, patient history, and epidemiological information. The expected result is Negative.  Fact Sheet for Patients:  EntrepreneurPulse.com.au  Fact Sheet for Healthcare Providers:  IncredibleEmployment.be  This test is no                          t yet approved or cleared by the [place] FDA and  has been authorized for detection and/or diagnosis of SARS-CoV-2 by FDA under an Emergency Use Authorization (EUA). This EUA will remain  in effect (meaning this test can be used) for the duration of the COVID-19 declaration under Section 564(b)(1) of the Act, 21 U.S.C.section 360bbb-3(b)(1), unless the authorization is terminated  or revoked sooner.       Influenza A by PCR 07/29/2021 NEGATIVE  NEGATIVE Final   Influenza B by PCR 07/29/2021 NEGATIVE  NEGATIVE Final   Comment: (NOTE) The Xpert Xpress SARS-CoV-2/FLU/RSV plus assay is intended as an aid in the diagnosis of influenza from Nasopharyngeal swab specimens and should not be used as a sole basis for treatment. Nasal washings and aspirates are unacceptable for Xpert Xpress SARS-CoV-2/FLU/RSV testing.  Fact Sheet for Patients: EntrepreneurPulse.com.au  Fact Sheet for Healthcare Providers: IncredibleEmployment.be  This test is not yet approved or cleared by the [place] FDA and has been authorized for detection and/or diagnosis of SARS-CoV-2 by FDA under an Emergency Use Authorization (EUA). This EUA will remain in effect  (meaning this test can be used) for the duration of the COVID-19 declaration under Section 564(b)(1) of the Act, 21 U.S.C. section 360bbb-3(b)(1), unless the  authorization is terminated or revoked.  Performed at [facility], [place] [address], [place], [place] [address]    WBC 07/29/2021 4.8  4.0 - 10.5 K/uL Final   RBC 07/29/2021 5.45  4.22 - 5.81 MIL/uL Final   Hemoglobin 07/29/2021 16.5  13.0 - 17.0 g/dL Final   HCT 07/29/2021 48.6  39.0 - 52.0 % Final   MCV 07/29/2021 89.2  80.0 - 100.0 fL Final   MCH 07/29/2021 30.3  26.0 - 34.0 pg Final   MCHC 07/29/2021 34.0  30.0 - 36.0 g/dL Final   RDW 07/29/2021 15.0  11.5 - 15.5 % Final   Platelets 07/29/2021 289  150 - 400 K/uL Final   nRBC 07/29/2021 0.0  0.0 - 0.2 % Final   Neutrophils Relative % 07/29/2021 52  % Final   Neutro Abs 07/29/2021 2.5  1.7 - 7.7 K/uL Final   Lymphocytes Relative 07/29/2021 40  % Final   Lymphs Abs 07/29/2021 2.0  0.7 - 4.0 K/uL Final   Monocytes Relative 07/29/2021 6  % Final   Monocytes Absolute 07/29/2021 0.3  0.1 - 1.0 K/uL Final   Eosinophils Relative 07/29/2021 2  % Final   Eosinophils Absolute 07/29/2021 0.1  0.0 - 0.5 K/uL Final   Basophils Relative 07/29/2021 0  % Final   Basophils Absolute 07/29/2021 0.0  0.0 - 0.1 K/uL Final   Immature Granulocytes 07/29/2021 0  % Final   Abs Immature Granulocytes 07/29/2021 0.02  0.00 - 0.07 K/uL Final   Performed at [facility], [place] [address], [place], [place] [address]   Sodium 07/29/2021 139  135 - 145 mmol/L Final   Potassium 07/29/2021 3.8  3.5 - 5.1 mmol/L Final   Chloride 07/29/2021 99  98 - 111 mmol/L Final   CO2 07/29/2021 25  22 - 32 mmol/L Final   Glucose, Bld 07/29/2021 74  70 - 99 mg/dL Final   Glucose reference range applies only to samples taken after fasting for at least 8 hours.   BUN 07/29/2021 8  8 - 23 mg/dL Final   Creatinine, Ser 07/29/2021 0.75  0.61 - 1.24 mg/dL Final   Calcium 07/29/2021 9.2  8.9 - 10.3 mg/dL Final    Total Protein 07/29/2021 7.1  6.5 - 8.1 g/dL Final   Albumin 07/29/2021 4.2  3.5 - 5.0 g/dL Final   AST 07/29/2021 55 (H)  15 - 41 U/L Final   ALT 07/29/2021 50 (H)  0 - 44 U/L Final   Alkaline Phosphatase 07/29/2021 61  38 - 126 U/L Final   Total Bilirubin 07/29/2021 1.1  0.3 - 1.2 mg/dL Final   GFR, Estimated 07/29/2021 >60  >60 mL/min Final   Comment: (NOTE) Calculated using the CKD-EPI Creatinine Equation (2021)    Anion gap 07/29/2021 15  5 - 15 Final   Performed at [place] [address] [place], [place] [address]   Hgb A1c MFr Bld 07/29/2021 5.4  4.8 - 5.6 % Final   Comment: (NOTE) Pre diabetes:          5.7%-6.4%  Diabetes:              >6.4%  Glycemic control for   <7.0% adults with diabetes    Mean Plasma Glucose 07/29/2021 108.28  mg/dL Final   Performed at [facility], [place] [address], [place], [place] [address]   Magnesium 07/29/2021 1.6 (L)  1.7 - 2.4 mg/dL Final   Performed at [facility], [place] [address] [place], [place] [address]   Alcohol, Ethyl (B) 07/29/2021 277 (H)  <10 mg/dL Final   Comment: (NOTE) Lowest detectable limit for serum alcohol is 10 mg/dL.  For medical purposes only. Performed at [facility], [place] [address] [place], [place] [address]    Cholesterol 07/29/2021 259 (H)  0 - 200 mg/dL Final   Triglycerides 07/29/2021 62  <150 mg/dL Final   HDL 07/29/2021 >135  >40 mg/dL Final   Total CHOL/HDL Ratio 07/29/2021 NOT CALCULATED  RATIO Final   VLDL 07/29/2021 12  0 - 40 mg/dL Final   LDL Cholesterol 07/29/2021 NOT CALCULATED  0 - 99 mg/dL Final   Performed at [facility], [place] [address] [place], [place] [address]   TSH 07/29/2021 0.545  0.350 - 4.500 uIU/mL Final   Comment: Performed by a 3rd Generation assay with a functional sensitivity of <=0.01 uIU/mL. Performed at [facility], [place] [address] [place], [place] [address]    Color, Urine 07/29/2021 YELLOW  YELLOW Final   APPearance 07/29/2021  CLEAR  CLEAR Final   Specific Gravity, Urine 07/29/2021 1.005  1.005 - 1.030 Final   pH 07/29/2021 5.0  5.0 - 8.0 Final   Glucose, UA 07/29/2021 NEGATIVE  NEGATIVE mg/dL Final   Hgb urine dipstick 07/29/2021 NEGATIVE  NEGATIVE Final   Bilirubin Urine 07/29/2021 NEGATIVE  NEGATIVE Final   Ketones, ur 07/29/2021 NEGATIVE  NEGATIVE mg/dL Final   Protein, ur 07/29/2021 NEGATIVE  NEGATIVE mg/dL Final   Nitrite 07/29/2021 NEGATIVE  NEGATIVE Final   Leukocytes,Ua 07/29/2021 NEGATIVE  NEGATIVE Final   WBC, UA 07/29/2021 0-5  0 - 5 WBC/hpf Final   Bacteria, UA 07/29/2021 NONE SEEN  NONE SEEN Final   Performed at [facility], [place] [address] [place], [place] [address]   POC Amphetamine UR 07/29/2021 None Detected  NONE DETECTED (Cut Off Level 1000 ng/mL) Final   POC Secobarbital (BAR) 07/29/2021 None Detected  NONE DETECTED (Cut Off Level 300 ng/mL) Final   POC Buprenorphine (BUP) 07/29/2021 None Detected  NONE DETECTED (Cut Off Level 10 ng/mL) Final   POC Oxazepam (BZO) 07/29/2021 None Detected  NONE DETECTED (Cut Off Level 300 ng/mL) Final   POC Cocaine UR 07/29/2021 None Detected  NONE DETECTED (Cut Off Level 300 ng/mL) Final   POC Methamphetamine UR 07/29/2021 None Detected  NONE DETECTED (Cut Off Level 1000 ng/mL) Final   POC Morphine 07/29/2021 None Detected  NONE DETECTED (Cut Off Level 300 ng/mL) Final   POC Oxycodone UR 07/29/2021 None Detected  NONE DETECTED (Cut Off Level 100 ng/mL) Final   POC Methadone UR 07/29/2021 None Detected  NONE DETECTED (Cut Off Level 300 ng/mL) Final   POC Marijuana UR 07/29/2021 Positive (A)  NONE DETECTED (Cut Off Level 50 ng/mL) Final   SARS Coronavirus 2 Ag 07/29/2021 Negative  Negative Final   RPR Ser Ql 07/29/2021 NON REACTIVE  NON REACTIVE Final   Performed at [facility], [place] [address] [place],  [address]   HIV Screen 4th Generation wRfx 07/29/2021 Non Reactive  Non Reactive Final   Performed at [facility], [place]  [address], [place],  [address]   SARSCOV2ONAVIRUS 2 AG 07/29/2021 NEGATIVE  NEGATIVE Final   Comment: (NOTE) SARS-CoV-2 antigen NOT DETECTED.   Negative results are presumptive.  Negative results do not preclude SARS-CoV-2 infection and should not be used as the sole basis for treatment or other patient  management decisions, including infection  control decisions, particularly in the presence of clinical signs and  symptoms consistent with COVID-19, or in those who have been in contact with the virus.  Negative results must be combined with clinical observations, patient history, and epidemiological information. The expected result is Negative.  Fact Sheet for Patients: https://www.jennings-kim.com/  Fact Sheet for Healthcare Providers: https://alexander-rogers.biz/  This test is not yet approved or cleared by the [place] FDA and  has been authorized for detection and/or diagnosis of SARS-CoV-2 by FDA under an Emergency Use Authorization (EUA).  This EUA will remain in effect (meaning this test can be used) for the duration of  the COV                          ID-19 declaration under Section 564(b)(1) of the Act, 21 U.S.C. section 360bbb-3(b)(1), unless the authorization is terminated or revoked sooner.    Admission on 07/06/2021, Discharged on 07/07/2021  Component Date Value Ref Range Status   Sodium 07/06/2021 140  135 - 145 mmol/L Final   Potassium 07/06/2021 4.1  3.5 - 5.1 mmol/L Final   Chloride 07/06/2021 106  98 - 111 mmol/L Final   CO2 07/06/2021 21 (L)  22 - 32 mmol/L Final   Glucose, Bld 07/06/2021 113 (H)  70 - 99 mg/dL Final   Glucose reference range applies only to samples taken after fasting for at least 8 hours.   BUN 07/06/2021 17  8 - 23 mg/dL Final   Creatinine, Ser 07/06/2021 1.07  0.61 - 1.24 mg/dL Final   Calcium 16/06/9603 9.0  8.9 - 10.3 mg/dL Final   Total Protein 54/05/8118 6.8  6.5 - 8.1 g/dL Final   Albumin 14/78/2956  4.0  3.5 - 5.0 g/dL Final   AST 21/30/8657 21  15 - 41 U/L Final   ALT 07/06/2021 20  0 - 44 U/L Final   Alkaline Phosphatase 07/06/2021 65  38 - 126 U/L Final   Total Bilirubin 07/06/2021 0.4  0.3 - 1.2 mg/dL Final   GFR, Estimated 07/06/2021 >60  >60 mL/min Final   Comment: (NOTE) Calculated using the CKD-EPI Creatinine Equation (2021)    Anion gap 07/06/2021 13  5 - 15 Final   Performed at [facility], [address], [place], [place] [address]   Alcohol, Ethyl (B) 07/06/2021 283 (H)  <10 mg/dL Final   Comment: (NOTE) Lowest detectable limit for serum alcohol is 10 mg/dL.  For medical purposes only. Performed at [facility], [address] [place], [place] [address]    Salicylate Lvl 07/06/2021 <7.0 (L)  7.0 - 30.0 mg/dL Final   Performed at [facility], [address] [place], [place] [address]   Acetaminophen (Tylenol), Serum 07/06/2021 <10 (L)  10 - 30 ug/mL Final   Comment: (NOTE) Therapeutic concentrations vary significantly. A range of 10-30 ug/mL  may be an effective concentration for many patients. However, some  are best treated at concentrations outside of this range. Acetaminophen concentrations >150 ug/mL at 4 hours after ingestion  and >50 ug/mL at 12 hours after ingestion are often associated with  toxic reactions.  Performed at [facility], [address] [place], [place] [address]    Opiates 07/06/2021 NONE DETECTED  NONE DETECTED Final   Cocaine 07/06/2021 NONE DETECTED  NONE DETECTED Final   Benzodiazepines 07/06/2021 NONE DETECTED  NONE DETECTED Final   Amphetamines 07/06/2021 NONE DETECTED  NONE DETECTED Final   Tetrahydrocannabinol 07/06/2021 POSITIVE (A)  NONE DETECTED Final   Barbiturates 07/06/2021 NONE DETECTED  NONE DETECTED Final   Comment: (NOTE) DRUG SCREEN FOR MEDICAL PURPOSES ONLY.  IF CONFIRMATION IS NEEDED FOR ANY PURPOSE, NOTIFY LAB WITHIN 5 DAYS.  LOWEST DETECTABLE LIMITS FOR URINE DRUG SCREEN Drug  Class                     Cutoff (ng/mL) Amphetamine and metabolites    1000 Barbiturate and metabolites    200 Benzodiazepine                 [id number] Tricyclics and metabolites     300 Opiates and metabolites        300 Cocaine and metabolites        300 THC                            50 Performed at [facility], [place] [address] [place], [place] [address]    SARS Coronavirus 2 by RT PCR 07/06/2021 NEGATIVE  NEGATIVE Final   Comment: (NOTE) SARS-CoV-2 target nucleic acids are NOT DETECTED.  The SARS-CoV-2 RNA is generally detectable in upper respiratory specimens during the acute phase of infection. The lowest concentration of SARS-CoV-2 viral copies this assay can detect is 138 copies/mL. A negative result does not preclude SARS-Cov-2 infection and should not be used as the sole basis for treatment or other patient management decisions. A negative result may occur with  improper specimen collection/handling, submission of specimen other than nasopharyngeal swab, presence of viral mutation(s) within the areas targeted by this assay, and inadequate number of viral copies(<138 copies/mL). A negative result must be combined with clinical observations, patient history, and epidemiological information. The expected result is Negative.  Fact Sheet for Patients:  EntrepreneurPulse.com.au  Fact Sheet for Healthcare Providers:  IncredibleEmployment.be  This test is no                          t yet approved or cleared by the [place] FDA and  has been authorized for detection and/or diagnosis of SARS-CoV-2 by FDA under an Emergency Use Authorization (EUA). This EUA will remain  in effect (meaning this test can be used) for the duration of the COVID-19 declaration under Section 564(b)(1) of the Act, 21 U.S.C.section 360bbb-3(b)(1), unless the authorization is terminated  or revoked sooner.       Influenza A by PCR 07/06/2021 NEGATIVE   NEGATIVE Final   Influenza B by PCR 07/06/2021 NEGATIVE  NEGATIVE Final   Comment: (NOTE) The Xpert Xpress SARS-CoV-2/FLU/RSV plus assay is intended as an aid in the diagnosis of influenza from Nasopharyngeal swab specimens and should not be used as a sole basis for treatment. Nasal washings and aspirates are unacceptable for Xpert Xpress SARS-CoV-2/FLU/RSV testing.  Fact Sheet for Patients: EntrepreneurPulse.com.au  Fact Sheet for Healthcare Providers: IncredibleEmployment.be  This test is not yet approved or cleared by the [place] FDA and has been authorized for detection and/or diagnosis of SARS-CoV-2 by FDA under an Emergency Use Authorization (EUA). This EUA will remain in effect (meaning this test can be used) for the duration of the COVID-19 declaration under Section 564(b)(1) of the Act, 21 U.S.C. section 360bbb-3(b)(1), unless the authorization is terminated or revoked.  Performed at [facility], [place] [address], [place], [place] [address]  WBC 07/06/2021 5.7  4.0 - 10.5 K/uL Final   RBC 07/06/2021 5.37  4.22 - 5.81 MIL/uL Final   Hemoglobin 07/06/2021 15.7  13.0 - 17.0 g/dL Final   HCT 07/06/2021 48.5  39.0 - 52.0 % Final   MCV 07/06/2021 90.3  80.0 - 100.0 fL Final   MCH 07/06/2021 29.2  26.0 - 34.0 pg Final   MCHC 07/06/2021 32.4  30.0 - 36.0 g/dL Final   RDW 07/06/2021 14.5  11.5 - 15.5 % Final   Platelets 07/06/2021 307  150 - 400 K/uL Final   nRBC 07/06/2021 0.0  0.0 - 0.2 % Final   Neutrophils Relative % 07/06/2021 53  % Final   Neutro Abs 07/06/2021 3.1  1.7 - 7.7 K/uL Final   Lymphocytes Relative 07/06/2021 35  % Final   Lymphs Abs 07/06/2021 2.0  0.7 - 4.0 K/uL Final   Monocytes Relative 07/06/2021 7  % Final   Monocytes Absolute 07/06/2021 0.4  0.1 - 1.0 K/uL Final   Eosinophils Relative 07/06/2021 3  % Final   Eosinophils Absolute 07/06/2021 0.2  0.0 - 0.5 K/uL Final   Basophils Relative 07/06/2021 1  %  Final   Basophils Absolute 07/06/2021 0.0  0.0 - 0.1 K/uL Final   Immature Granulocytes 07/06/2021 1  % Final   Abs Immature Granulocytes 07/06/2021 0.06  0.00 - 0.07 K/uL Final   Performed at [facility], [place] [address] [place], [place] [address]   Color, Urine 07/06/2021 STRAW (A)  YELLOW Final   APPearance 07/06/2021 CLEAR  CLEAR Final   Specific Gravity, Urine 07/06/2021 1.010  1.005 - 1.030 Final   pH 07/06/2021 5.0  5.0 - 8.0 Final   Glucose, UA 07/06/2021 NEGATIVE  NEGATIVE mg/dL Final   Hgb urine dipstick 07/06/2021 NEGATIVE  NEGATIVE Final   Bilirubin Urine 07/06/2021 NEGATIVE  NEGATIVE Final   Ketones, ur 07/06/2021 NEGATIVE  NEGATIVE mg/dL Final   Protein, ur 07/06/2021 NEGATIVE  NEGATIVE mg/dL Final   Nitrite 07/06/2021 NEGATIVE  NEGATIVE Final   Leukocytes,Ua 07/06/2021 NEGATIVE  NEGATIVE Final   Performed at [facility], [place] [address], [place],  [address]  Admission on 02/21/2021, Discharged on 02/22/2021  Component Date Value Ref Range Status   WBC 02/21/2021 6.5  4.0 - 10.5 K/uL Final   RBC 02/21/2021 4.61  4.22 - 5.81 MIL/uL Final   Hemoglobin 02/21/2021 13.3  13.0 - 17.0 g/dL Final   HCT 02/21/2021 40.4  39.0 - 52.0 % Final   MCV 02/21/2021 87.6  80.0 - 100.0 fL Final   MCH 02/21/2021 28.9  26.0 - 34.0 pg Final   MCHC 02/21/2021 32.9  30.0 - 36.0 g/dL Final   RDW 02/21/2021 12.7  11.5 - 15.5 % Final   Platelets 02/21/2021 605 (H)  150 - 400 K/uL Final   nRBC 02/21/2021 0.0  0.0 - 0.2 % Final   Neutrophils Relative % 02/21/2021 49  % Final   Neutro Abs 02/21/2021 3.2  1.7 - 7.7 K/uL Final   Lymphocytes Relative 02/21/2021 43  % Final   Lymphs Abs 02/21/2021 2.8  0.7 - 4.0 K/uL Final   Monocytes Relative 02/21/2021 6  % Final   Monocytes Absolute 02/21/2021 0.4  0.1 - 1.0 K/uL Final   Eosinophils Relative 02/21/2021 1  % Final   Eosinophils Absolute 02/21/2021 0.1  0.0 - 0.5 K/uL Final   Basophils Relative 02/21/2021 0  % Final   Basophils  Absolute 02/21/2021 0.0  0.0 -  0.1 K/uL Final   Immature Granulocytes 02/21/2021 1  % Final   Abs Immature Granulocytes 02/21/2021 0.04  0.00 - 0.07 K/uL Final   Performed at [facility], [place] [address] [place], [place] [address]   Sodium 02/21/2021 140  135 - 145 mmol/L Final   Potassium 02/21/2021 3.2 (L)  3.5 - 5.1 mmol/L Final   Chloride 02/21/2021 105  98 - 111 mmol/L Final   CO2 02/21/2021 22  22 - 32 mmol/L Final   Glucose, Bld 02/21/2021 102 (H)  70 - 99 mg/dL Final   Glucose reference range applies only to samples taken after fasting for at least 8 hours.   BUN 02/21/2021 10  8 - 23 mg/dL Final   Creatinine, Ser 02/21/2021 0.80  0.61 - 1.24 mg/dL Final   Calcium 02/21/2021 9.5  8.9 - 10.3 mg/dL Final   GFR, Estimated 02/21/2021 >60  >60 mL/min Final   Comment: (NOTE) Calculated using the CKD-EPI Creatinine Equation (2021)    Anion gap 02/21/2021 13  5 - 15 Final   Performed at [facility], [place] [address], [place], [place] [address]   Specimen Description 02/22/2021    Final                   Value:JOINT FLUID RIGHT ELBOW Performed at [facility], [place] [address], [place], [place] [address]    Special Requests 02/22/2021    Final                   Value:Normal Performed at [facility], [place] [address], [place], [place] [address]    Gram Stain 02/22/2021    Final                   Value:RARE WBC PRESENT,BOTH PMN AND MONONUCLEAR NO ORGANISMS SEEN    Culture 02/22/2021    Final                   Value:NO GROWTH 3 DAYS Performed at [facility], [place] [address], [place], [place] [address]    Report Status 02/22/2021 02/25/2021 FINAL   Final   Color, Synovial 02/22/2021 RED (A)  YELLOW Final   Appearance-Synovial 02/22/2021 TURBID (A)  CLEAR Final   Crystals, Fluid 02/22/2021 EXTRACELLULAR MONOSODIUM URATE CRYSTALS   Final   WBC, Synovial 02/22/2021 7,070 (H)  0 - 200 /cu mm Final    Neutrophil, Synovial 02/22/2021 55 (H)  0 - 25 % Final   Lymphocytes-Synovial Fld 02/22/2021 6  0 - 20 % Final   Monocyte-Macrophage-Synovial Fluid 02/22/2021 39 (L)  50 - 90 % Final   Eosinophils-Synovial 02/22/2021 0  0 - 1 % Final   Other Cells-SYN 02/22/2021 OTHER CELLS IDENTIFIED AS SYNOVIAL LINING CELLS   Final   Performed at [facility], [place] [address], [place], [place] [address]  Admission on 02/09/2021, Discharged on 02/10/2021  Component Date Value Ref Range Status   Lactic Acid, Venous 02/10/2021 1.1  0.5 - 1.9 mmol/L Final   Performed at [facility], [place] [address], [place], [place] [address]   Lactic Acid, Venous 02/09/2021 0.8  0.5 - 1.9 mmol/L Final   Performed at [place] [address] [place], [place] [address]   Sodium 02/09/2021 136  135 - 145 mmol/L Final   Potassium 02/09/2021 3.2 (L)  3.5 - 5.1 mmol/L Final   Chloride 02/09/2021 98  98 -  111 mmol/L Final   CO2 02/09/2021 27  22 - 32 mmol/L Final   Glucose, Bld 02/09/2021 103 (H)  70 - 99 mg/dL Final   Glucose reference range applies only to samples taken after fasting for at least 8 hours.   BUN 02/09/2021 12  8 - 23 mg/dL Final   Creatinine, Ser 02/09/2021 0.81  0.61 - 1.24 mg/dL Final   Calcium 02/09/2021 9.1  8.9 - 10.3 mg/dL Final   Total Protein 02/09/2021 7.3  6.5 - 8.1 g/dL Final   Albumin 02/09/2021 3.4 (L)  3.5 - 5.0 g/dL Final   AST 02/09/2021 13 (L)  15 - 41 U/L Final   ALT 02/09/2021 13  0 - 44 U/L Final   Alkaline Phosphatase 02/09/2021 60  38 - 126 U/L Final   Total Bilirubin 02/09/2021 1.1  0.3 - 1.2 mg/dL Final   GFR, Estimated 02/09/2021 >60  >60 mL/min Final   Comment: (NOTE) Calculated using the CKD-EPI Creatinine Equation (2021)    Anion gap 02/09/2021 11  5 - 15 Final   Performed at [facility], [place] [address], [place], [place] [address]   WBC 02/09/2021 12.8 (H)  4.0 - 10.5 K/uL Final   RBC 02/09/2021  4.31  4.22 - 5.81 MIL/uL Final   Hemoglobin 02/09/2021 12.7 (L)  13.0 - 17.0 g/dL Final   HCT 02/09/2021 37.8 (L)  39.0 - 52.0 % Final   MCV 02/09/2021 87.7  80.0 - 100.0 fL Final   MCH 02/09/2021 29.5  26.0 - 34.0 pg Final   MCHC 02/09/2021 33.6  30.0 - 36.0 g/dL Final   RDW 02/09/2021 12.3  11.5 - 15.5 % Final   Platelets 02/09/2021 368  150 - 400 K/uL Final   nRBC 02/09/2021 0.0  0.0 - 0.2 % Final   Neutrophils Relative % 02/09/2021 75  % Final   Neutro Abs 02/09/2021 9.6 (H)  1.7 - 7.7 K/uL Final   Lymphocytes Relative 02/09/2021 16  % Final   Lymphs Abs 02/09/2021 2.1  0.7 - 4.0 K/uL Final   Monocytes Relative 02/09/2021 8  % Final   Monocytes Absolute 02/09/2021 1.0  0.1 - 1.0 K/uL Final   Eosinophils Relative 02/09/2021 0  % Final   Eosinophils Absolute 02/09/2021 0.0  0.0 - 0.5 K/uL Final   Basophils Relative 02/09/2021 0  % Final   Basophils Absolute 02/09/2021 0.0  0.0 - 0.1 K/uL Final   Immature Granulocytes 02/09/2021 1  % Final   Abs Immature Granulocytes 02/09/2021 0.09 (H)  0.00 - 0.07 K/uL Final   Performed at [facility], [place] [address] [place], [place] [address]   Color, Urine 02/10/2021 YELLOW  YELLOW Final   APPearance 02/10/2021 CLEAR  CLEAR Final   Specific Gravity, Urine 02/10/2021 1.015  1.005 - 1.030 Final   pH 02/10/2021 6.0  5.0 - 8.0 Final   Glucose, UA 02/10/2021 NEGATIVE  NEGATIVE mg/dL Final   Hgb urine dipstick 02/10/2021 NEGATIVE  NEGATIVE Final   Bilirubin Urine 02/10/2021 NEGATIVE  NEGATIVE Final   Ketones, ur 02/10/2021 20 (A)  NEGATIVE mg/dL Final   Protein, ur 02/10/2021 NEGATIVE  NEGATIVE mg/dL Final   Nitrite 02/10/2021 NEGATIVE  NEGATIVE Final   Leukocytes,Ua 02/10/2021 NEGATIVE  NEGATIVE Final   Performed at [place] [address], [place],  [address]   Specimen Description 02/09/2021    Final  Value:BLOOD LEFT ANTECUBITAL Performed at [place] [address], [place], [place] [address]    Special Requests 02/09/2021    Final                   Value:BOTTLES DRAWN AEROBIC AND ANAEROBIC Blood Culture adequate volume Performed at [place] [address] [place], [place] [address]    Culture 02/09/2021    Final                   Value:NO GROWTH 5 DAYS Performed at [place] [address] [place], [place] [address]    Report Status 02/09/2021 02/15/2021 FINAL   Final   Specimen Description 02/09/2021    Final                   Value:BLOOD LEFT ANTECUBITAL Performed at [facility], [place] [address] [place], [place] [address]    Special Requests 02/09/2021    Final                   Value:BOTTLES DRAWN AEROBIC AND ANAEROBIC Blood Culture adequate volume Performed at [place] [address] [place], [place] [address]    Culture 02/09/2021    Final                   Value:NO GROWTH 5 DAYS Performed at [place] [address] [place], [place] [address]    Report Status 02/09/2021 02/15/2021 FINAL   Final   Sed Rate 02/09/2021 89 (H)  0 - 16 mm/hr Final   Performed at [facility] [place] [address] [place], [place] [address]   CRP 02/09/2021 14.7 (H)  <1.0 mg/dL Final   Performed at [place] [address] [place], [place] [address]    Blood Alcohol level:  Lab Results  Component Value Date   ETH 277 (H) 07/29/2021   ETH 283 (H) [id number]    Metabolic Disorder Labs: Lab Results  Component Value Date   HGBA1C 5.4 07/29/2021   MPG 108.28 07/29/2021   MPG 108.28 11/26/2020   No results found for: PROLACTIN Lab Results  Component Value Date   CHOL 259 (H) 07/29/2021   TRIG 62 07/29/2021   HDL >135 07/29/2021   CHOLHDL NOT CALCULATED 07/29/2021   VLDL 12 07/29/2021   LDLCALC NOT CALCULATED 07/29/2021    Therapeutic Lab Levels: No results found for: LITHIUM No results found for:  VALPROATE No components found for:  CBMZ  Physical Findings   Flowsheet Row ED from 07/29/2021 in [facility] ED from 07/06/2021 in [facility] ED from 06/23/2021 in [place] DEPT  C-SSRS RISK CATEGORY Low Risk High Risk No Risk        Musculoskeletal  Strength & Muscle Tone: within normal limits Gait & Station: normal Patient leans: N/A  Psychiatric Specialty Exam  Presentation  General Appearance: Appropriate for Environment; Casual  Eye Contact:Good  Speech:Clear and Coherent; Normal Rate  Speech Volume:Normal  Handedness:Right   Mood and Affect  Mood:-- ("pretty good")  Affect:Appropriate; Congruent   Thought Process  Thought Processes:Coherent; Goal Directed; Linear  Descriptions of Associations:Intact  Orientation:Full (Time, Place and Person)  Thought Content:WDL; Logical     Hallucinations:Hallucinations: None  Ideas of Reference:None  Suicidal Thoughts:Suicidal Thoughts: No  Homicidal Thoughts:Homicidal Thoughts: No    Sensorium  Memory:Immediate Good; Recent Good; Remote Good  Judgment:Intact  Insight:Fair   Executive Functions  Concentration:Fair  Attention Span:Fair  Recall:Good  Fund of Knowledge:Good  Language:Good   Psychomotor Activity  Psychomotor Activity:Psychomotor Activity: Tremor   Assets  Assets:Desire for Improvement; Armed forces logistics/support/administrative officer; Housing; Resilience; Social Support   Sleep  Sleep:Sleep: Fair   No data recorded  Physical Exam  Physical Exam Constitutional:      Appearance: Normal appearance. He is normal weight.  HENT:     Head: Normocephalic and atraumatic.  Eyes:     Extraocular Movements: Extraocular movements intact.  Cardiovascular:     Rate and Rhythm: Normal rate.  Pulmonary:     Effort: Pulmonary effort is normal.  Abdominal:     Palpations: Abdomen is soft.  Neurological:      General: No focal deficit present.     Mental Status: He is alert and oriented to person, place, and time.     Comments: tremulous  Psychiatric:        Attention and Perception: Attention and perception normal.        Speech: Speech normal.        Behavior: Behavior is cooperative.   Review of Systems  Constitutional:  Negative for chills and fever.  HENT:  Negative for hearing loss.   Eyes:  Negative for discharge and redness.  Respiratory:  Negative for cough.   Cardiovascular:  Negative for chest pain.  Gastrointestinal:  Positive for abdominal pain, diarrhea and nausea. Negative for vomiting.  Musculoskeletal:  Negative for myalgias.  Neurological:  Positive for tremors and headaches.  Psychiatric/Behavioral:  Positive for depression and substance abuse. The patient does not have insomnia.   Blood pressure 109/79, pulse 98, temperature (!) 97.5 F (36.4 C), temperature source Tympanic, resp. rate 18, SpO2 99 %. There is no height or weight on file to calculate BMI.  Treatment Plan Summary: 60 year old male with past reported psychiatric history of alcohol use disorder and depression who presented to the [facility] on [facility]15 with chief complaint of suicidal thoughts and alcohol use in the setting of recent relapse (vodka) on Friday 11/11; last drink 11/15.  Patient was transferred from the [facility] to the [facility] on 11/16 for continued treatment of mood and alcohol detox.  Patient started on alcohol detox protocol and Ativan taper on 11/16.  Today patient reporting alcohol withdrawal symptoms of tremors, headache, nausea and GI upset.  Patient remains appropriate for continued admission to the [facility] for alcohol detox.      AUD, severe Alcohol Withdrawal, uncomplicated -Ativan taper-1 mg Ativan 4 times daily followed by 1 mg Ativan 3 times daily followed by 1 mg Ativan twice daily followed by 1 mg Ativan times once -CIWA protocol -PRN medications to assist with alcohol withdrawal sx -started  gabapentin 100 mg tID (11/17) for alcohol withdrawal/cravings; pt reported back pain which gabapentin may also assist with. Increased to 200 mg gabapentin TID (11/18)  MDD R/o SIMD -zoloft 25 mg (started 11/16); will titrate as deemed appropriate/clinically indicated  Hypertension -continue amlodipine 5 mg; consider titrating doses clinically indicated/appropriate  Gout Indomethocin 25 mg TID PRN  Dispo: Ongoing.  Social work Sports administrator.  Ativan taper completing Sunday November 20.  He has been accepted to [facility] for residential substance use treatment Monday, November 21.  Patient will need 14-day samples and 30-day prescription with 1 refill prior to discharge.  Ival Bible, MD 08/01/2021 2:10 PM

## 2021-08-01 NOTE — ED Notes (Signed)
Patient A&Ox4. Denies intent to harm self/others when asked. Denies A/VH. Continue to have mild tremors due to ETOH withdrawal. C/O hard stools. MOM given. No further complaints voiced. Support and encouragement provided. Routine safety checks conducted according to facility protocol. Encouraged patient to notify staff if thoughts of harm toward self or others arise. Patient verbalize understanding and agreement. Will continue to monitor for safety.

## 2021-08-01 NOTE — ED Notes (Signed)
Pt is in the bed sleeping. Respirations are even and unlabored. No acute distress noted. Will continue to monitor for safety. 

## 2021-08-01 NOTE — Progress Notes (Signed)
Spirituality group facilitated by Wilkie Aye, MDiv, BCC.  Group Description: Group focused on topic of hope. Patients participated in facilitated discussion around topic, connecting with one another around experiences and definitions for hope. Group members engaged with visual explorer photos, reflecting on what hope looks like for them today. Group engaged in discussion around how their definitions of hope are present today in hospital.  Modalities: Psycho-social ed, Adlerian, Narrative, MI  Patient Progress:  Willie Collins was present throughout group.  Engaged in group discussion with facilitator and group members with appropriate affect throughout.  Identified definitions and sources of hope connected his life.  Identified plans for practicing connection to sources of hope.
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
## 2021-08-01 NOTE — ED Notes (Addendum)
Patient is in the day room with other patients watching TV. He stated that he does not want to live but has no specific plan. He is feeling the effects of EtOH withdrawal. Will continue to monitor for safety.

## 2021-08-01 NOTE — ED Notes (Signed)
Pt eating lunch

## 2021-08-02 DIAGNOSIS — F1994 Other psychoactive substance use, unspecified with psychoactive substance-induced mood disorder: Secondary | ICD-10-CM | POA: Diagnosis not present

## 2021-08-02 DIAGNOSIS — R45851 Suicidal ideations: Secondary | ICD-10-CM | POA: Diagnosis not present

## 2021-08-02 DIAGNOSIS — F4323 Adjustment disorder with mixed anxiety and depressed mood: Secondary | ICD-10-CM | POA: Diagnosis not present

## 2021-08-02 DIAGNOSIS — F332 Major depressive disorder, recurrent severe without psychotic features: Secondary | ICD-10-CM | POA: Diagnosis not present

## 2021-08-02 MED ORDER — AMLODIPINE BESYLATE 5 MG PO TABS: 5.0000 mg | ORAL_TABLET | Freq: Every day | ORAL | 1 refills | Status: DC

## 2021-08-02 MED ORDER — TRAZODONE HCL 50 MG PO TABS: 50.0000 mg | ORAL_TABLET | Freq: Every evening | ORAL | 1 refills | Status: DC | PRN

## 2021-08-02 MED ORDER — INDOMETHACIN 25 MG PO CAPS
25.0000 mg | ORAL_CAPSULE | Freq: Three times a day (TID) | ORAL | 0 refills | Status: DC
Start: 2021-08-02 — End: 2021-08-04

## 2021-08-02 MED ORDER — INDOMETHACIN 25 MG PO CAPS: 25.0000 mg | ORAL_CAPSULE | Freq: Three times a day (TID) | ORAL | 1 refills | Status: DC | PRN

## 2021-08-02 MED ORDER — GABAPENTIN 100 MG PO CAPS: 200.0000 mg | ORAL_CAPSULE | Freq: Three times a day (TID) | ORAL | 1 refills | Status: DC

## 2021-08-02 MED ORDER — SERTRALINE HCL 50 MG PO TABS: 50.0000 mg | ORAL_TABLET | Freq: Every day | ORAL | 0 refills | Status: DC

## 2021-08-02 MED ORDER — TRAZODONE HCL 50 MG PO TABS
50.0000 mg | ORAL_TABLET | Freq: Every evening | ORAL | Status: DC | PRN
  Filled 2021-08-02: qty 14

## 2021-08-02 MED ORDER — TRAZODONE HCL 100 MG PO TABS
100.0000 mg | ORAL_TABLET | Freq: Every evening | ORAL | Status: DC | PRN
  Administered 2021-08-03: 100 mg via ORAL
  Filled 2021-08-02: qty 1

## 2021-08-02 MED ORDER — GABAPENTIN 100 MG PO CAPS: 100.0000 mg | ORAL_CAPSULE | Freq: Three times a day (TID) | ORAL | 0 refills | Status: DC

## 2021-08-02 MED ORDER — TRAZODONE HCL 50 MG PO TABS: 50.0000 mg | ORAL_TABLET | Freq: Every evening | ORAL | 0 refills | Status: DC | PRN

## 2021-08-02 MED ORDER — SERTRALINE HCL 50 MG PO TABS: 50.0000 mg | ORAL_TABLET | Freq: Every day | ORAL | 1 refills | Status: DC

## 2021-08-02 MED ORDER — GABAPENTIN 100 MG PO CAPS
200.0000 mg | ORAL_CAPSULE | Freq: Three times a day (TID) | ORAL | 0 refills | Status: DC
Start: 2021-08-02 — End: 2021-08-04

## 2021-08-02 NOTE — Discharge Instructions (Addendum)

## 2021-08-02 NOTE — ED Provider Notes (Addendum)
Behavioral Health Progress Note  Date and Time: 08/02/2021 3:17 PM Name: Willie Collins MRN:  [id number]  Subjective:  Willie Collins, 60 y.o., male patient seen face to face by this provider, chart reviewed and discussed with Dr. Bronwen Betters on 08/02/21.  On evaluation Willie Collins is calm and cooperative. He is alert/oriented x 4. He endorses depression with congruent affect.  He does not appear to be responding to internal/external stimuli. He denies any concerns with appetite or sleep. He is tolerating medications without adverse reactions. Denies any withdrawal symptoms or health concerns.  He endorses few passive thoughts of SI with no intent. States before he was brought to [facility] he was having thoughts of hanging himself, but states "I wouldn't do that". Denies homicidal ideations, psychosis, and paranoia. He has attended/participated in group sessions. Patient's feelings and progress discussed. Reassurance, support, and encouragement provided.      Diagnosis:  Final diagnoses:  Substance induced mood disorder (HCC)    Total Time spent with patient: 20 minutes  Past Psychiatric History: seeH&P Past Medical History:  Past Medical History:  Diagnosis Date   Hypertension     Past Surgical History:  Procedure Laterality Date   IRRIGATION AND DEBRIDEMENT FOOT Left 11/25/2020   Procedure: IRRIGATION AND DEBRIDEMENT FOOT;  Surgeon: Candelaria Stagers, DPM;  Location: [facility];  Service: Podiatry;  Laterality: Left;   Family History: No family history on file. Family Psychiatric  History: See H&P Social History:  Social History   Substance and Sexual Activity  Alcohol Use Yes   Comment: weekends     Social History   Substance and Sexual Activity  Drug Use Never    Social History   Socioeconomic History   Marital status: Single    Spouse name: Not on file   Number of children: Not on file   Years of education: Not on file   Highest education level: Not on file   Occupational History   Not on file  Tobacco Use   Smoking status: Never   Smokeless tobacco: Never  Vaping Use   Vaping Use: Never used  Substance and Sexual Activity   Alcohol use: Yes    Comment: weekends   Drug use: Never   Sexual activity: Yes    Partners: Female  Other Topics Concern   Not on file  Social History Narrative   Not on file   Social Determinants of Health   Financial Resource Strain: Not on file  Food Insecurity: Not on file  Transportation Needs: Not on file  Physical Activity: Not on file  Stress: Not on file  Social Connections: Not on file   SDOH:  SDOH Screenings   Alcohol Screen: Not on file  Depression (PHQ2-9): Not on file  Financial Resource Strain: Not on file  Food Insecurity: Not on file  Housing: Not on file  Physical Activity: Not on file  Social Connections: Not on file  Stress: Not on file  Tobacco Use: Low Risk    Smoking Tobacco Use: Never   Smokeless Tobacco Use: Never   Passive Exposure: Not on file  Transportation Needs: Not on file   Additional Social History:    Pain Medications: None History of alcohol / drug use?: Yes Longest period of sobriety (when/how long): 6 months Negative Consequences of Use: Financial, Personal relationships Withdrawal Symptoms: Tremors, Irritability, Nausea / Vomiting, Agitation Name of Substance 1: ETOH (Alcohol) 1 - Age of First Use: Unknown 1 - Amount (size/oz): Two fifths of alcohol 1 -  Frequency: Daily 1 - Duration: "Many years" 1 - Last Use / Amount: Tuesday; 2 fifths of liquor 1 - Method of Aquiring: Purchases from store 1- Route of Use: Oral; Drinking      Sleep: Good  Appetite:  Good  Current Medications:  Current Facility-Administered Medications  Medication Dose Route Frequency Provider Last Rate Last Admin   acetaminophen (TYLENOL) tablet 650 mg  650 mg Oral Q6H PRN Revonda Humphrey, NP   650 mg at 08/01/21 2200   alum & mag hydroxide-simeth (MAALOX/MYLANTA)  200-200-20 MG/5ML suspension 30 mL  30 mL Oral Q4H PRN Revonda Humphrey, NP       amLODipine (NORVASC) tablet 5 mg  5 mg Oral Daily Ival Bible, MD   5 mg at 08/02/21 1152   gabapentin (NEURONTIN) capsule 200 mg  200 mg Oral TID Ival Bible, MD   200 mg at 08/02/21 1152   indomethacin (INDOCIN) capsule 25 mg  25 mg Oral TID PRN Ival Bible, MD       loperamide (IMODIUM) capsule 2-4 mg  2-4 mg Oral PRN Ival Bible, MD       LORazepam (ATIVAN) tablet 1 mg  1 mg Oral Q6H PRN Ival Bible, MD       LORazepam (ATIVAN) tablet 1 mg  1 mg Oral BID Ival Bible, MD   1 mg at 08/02/21 1152   Followed by   Derrill Memo ON 08/03/2021] LORazepam (ATIVAN) tablet 1 mg  1 mg Oral Daily Ival Bible, MD       magnesium hydroxide (MILK OF MAGNESIA) suspension 30 mL  30 mL Oral Daily PRN Revonda Humphrey, NP   30 mL at 08/01/21 1031   multivitamin with minerals tablet 1 tablet  1 tablet Oral Daily Revonda Humphrey, NP   1 tablet at 08/02/21 1152   ondansetron (ZOFRAN-ODT) disintegrating tablet 4 mg  4 mg Oral Q6H PRN Ival Bible, MD       sertraline (ZOLOFT) tablet 50 mg  50 mg Oral Daily Penn, Cicely, NP   50 mg at 08/02/21 1155   thiamine tablet 100 mg  100 mg Oral Daily Thomes Lolling H, NP   100 mg at 08/02/21 1156   traZODone (DESYREL) tablet 50 mg  50 mg Oral QHS,MR X 1 Lindon Romp A, NP   50 mg at 08/01/21 2157   Current Outpatient Medications  Medication Sig Dispense Refill   amLODipine (NORVASC) 5 MG tablet Take 1 tablet (5 mg total) by mouth daily. 30 tablet 1   indomethacin (INDOCIN) 25 MG capsule Take 1 capsule (25 mg total) by mouth 3 (three) times daily with meals. 42 capsule 0   sertraline (ZOLOFT) 50 MG tablet Take 1 tablet (50 mg total) by mouth daily. 14 tablet 0   traZODone (DESYREL) 50 MG tablet Take 1 tablet (50 mg total) by mouth at bedtime as needed for sleep. 14 tablet 0   traZODone (DESYREL) 50 MG tablet Take 1 tablet  (50 mg total) by mouth at bedtime as needed for sleep. 30 tablet 1   amLODipine (NORVASC) 5 MG tablet Take 1 tablet (5 mg total) by mouth daily. 14 tablet 1   gabapentin (NEURONTIN) 100 MG capsule Take 2 capsules (200 mg total) by mouth 3 (three) times daily. 180 capsule 1   gabapentin (NEURONTIN) 100 MG capsule Take 2 capsules (200 mg total) by mouth 3 (three) times daily. 84 capsule 0   indomethacin (INDOCIN) 25  MG capsule Take 1 capsule (25 mg total) by mouth 3 (three) times daily as needed for moderate pain or mild pain (gout pain). 90 capsule 1   sertraline (ZOLOFT) 50 MG tablet Take 1 tablet (50 mg total) by mouth daily. 30 tablet 1    Labs  Lab Results:  Admission on 07/29/2021  Component Date Value Ref Range Status   SARS Coronavirus 2 by RT PCR 07/29/2021 NEGATIVE  NEGATIVE Final   Comment: (NOTE) SARS-CoV-2 target nucleic acids are NOT DETECTED.  The SARS-CoV-2 RNA is generally detectable in upper respiratory specimens during the acute phase of infection. The lowest concentration of SARS-CoV-2 viral copies this assay can detect is 138 copies/mL. A negative result does not preclude SARS-Cov-2 infection and should not be used as the sole basis for treatment or other patient management decisions. A negative result may occur with  improper specimen collection/handling, submission of specimen other than nasopharyngeal swab, presence of viral mutation(s) within the areas targeted by this assay, and inadequate number of viral copies(<138 copies/mL). A negative result must be combined with clinical observations, patient history, and epidemiological information. The expected result is Negative.  Fact Sheet for Patients:  EntrepreneurPulse.com.au  Fact Sheet for Healthcare Providers:  IncredibleEmployment.be  This test is no                          t yet approved or cleared by the [place] FDA and  has been authorized for detection and/or  diagnosis of SARS-CoV-2 by FDA under an Emergency Use Authorization (EUA). This EUA will remain  in effect (meaning this test can be used) for the duration of the COVID-19 declaration under Section 564(b)(1) of the Act, 21 U.S.C.section 360bbb-3(b)(1), unless the authorization is terminated  or revoked sooner.       Influenza A by PCR 07/29/2021 NEGATIVE  NEGATIVE Final   Influenza B by PCR 07/29/2021 NEGATIVE  NEGATIVE Final   Comment: (NOTE) The Xpert Xpress SARS-CoV-2/FLU/RSV plus assay is intended as an aid in the diagnosis of influenza from Nasopharyngeal swab specimens and should not be used as a sole basis for treatment. Nasal washings and aspirates are unacceptable for Xpert Xpress SARS-CoV-2/FLU/RSV testing.  Fact Sheet for Patients: EntrepreneurPulse.com.au  Fact Sheet for Healthcare Providers: IncredibleEmployment.be  This test is not yet approved or cleared by the [place] FDA and has been authorized for detection and/or diagnosis of SARS-CoV-2 by FDA under an Emergency Use Authorization (EUA). This EUA will remain in effect (meaning this test can be used) for the duration of the COVID-19 declaration under Section 564(b)(1) of the Act, 21 U.S.C. section 360bbb-3(b)(1), unless the authorization is terminated or revoked.  Performed at [facility], [place] [address], [place], [place] [address]    WBC 07/29/2021 4.8  4.0 - 10.5 K/uL Final   RBC 07/29/2021 5.45  4.22 - 5.81 MIL/uL Final   Hemoglobin 07/29/2021 16.5  13.0 - 17.0 g/dL Final   HCT 07/29/2021 48.6  39.0 - 52.0 % Final   MCV 07/29/2021 89.2  80.0 - 100.0 fL Final   MCH 07/29/2021 30.3  26.0 - 34.0 pg Final   MCHC 07/29/2021 34.0  30.0 - 36.0 g/dL Final   RDW 07/29/2021 15.0  11.5 - 15.5 % Final   Platelets 07/29/2021 289  150 - 400 K/uL Final   nRBC 07/29/2021 0.0  0.0 - 0.2 % Final   Neutrophils Relative % 07/29/2021 52  % Final  Neutro Abs 07/29/2021 2.5   1.7 - 7.7 K/uL Final   Lymphocytes Relative 07/29/2021 40  % Final   Lymphs Abs 07/29/2021 2.0  0.7 - 4.0 K/uL Final   Monocytes Relative 07/29/2021 6  % Final   Monocytes Absolute 07/29/2021 0.3  0.1 - 1.0 K/uL Final   Eosinophils Relative 07/29/2021 2  % Final   Eosinophils Absolute 07/29/2021 0.1  0.0 - 0.5 K/uL Final   Basophils Relative 07/29/2021 0  % Final   Basophils Absolute 07/29/2021 0.0  0.0 - 0.1 K/uL Final   Immature Granulocytes 07/29/2021 0  % Final   Abs Immature Granulocytes 07/29/2021 0.02  0.00 - 0.07 K/uL Final   Performed at [facility] [address] [place], [place] [address]   Sodium 07/29/2021 139  135 - 145 mmol/L Final   Potassium 07/29/2021 3.8  3.5 - 5.1 mmol/L Final   Chloride 07/29/2021 99  98 - 111 mmol/L Final   CO2 07/29/2021 25  22 - 32 mmol/L Final   Glucose, Bld 07/29/2021 74  70 - 99 mg/dL Final   Glucose reference range applies only to samples taken after fasting for at least 8 hours.   BUN 07/29/2021 8  8 - 23 mg/dL Final   Creatinine, Ser 07/29/2021 0.75  0.61 - 1.24 mg/dL Final   Calcium 07/29/2021 9.2  8.9 - 10.3 mg/dL Final   Total Protein 07/29/2021 7.1  6.5 - 8.1 g/dL Final   Albumin 07/29/2021 4.2  3.5 - 5.0 g/dL Final   AST 07/29/2021 55 (H)  15 - 41 U/L Final   ALT 07/29/2021 50 (H)  0 - 44 U/L Final   Alkaline Phosphatase 07/29/2021 61  38 - 126 U/L Final   Total Bilirubin 07/29/2021 1.1  0.3 - 1.2 mg/dL Final   GFR, Estimated 07/29/2021 >60  >60 mL/min Final   Comment: (NOTE) Calculated using the CKD-EPI Creatinine Equation (2021)    Anion gap 07/29/2021 15  5 - 15 Final   Performed at [facility] [address] [place], [place] [address]   Hgb A1c MFr Bld 07/29/2021 5.4  4.8 - 5.6 % Final   Comment: (NOTE) Pre diabetes:          5.7%-6.4%  Diabetes:              >6.4%  Glycemic control for   <7.0% adults with diabetes    Mean Plasma Glucose 07/29/2021 108.28  mg/dL Final   Performed at [facility], [place] [address] [place], [place] [address]   Magnesium 07/29/2021 1.6 (L)  1.7 - 2.4 mg/dL Final   Performed at [facility] [address] [place], [place] [address]   Alcohol, Ethyl (B) 07/29/2021 277 (H)  <10 mg/dL Final   Comment: (NOTE) Lowest detectable limit for serum alcohol is 10 mg/dL.  For medical purposes only. Performed at [facility], [place] [address], [place], [place] [address]    Cholesterol 07/29/2021 259 (H)  0 - 200 mg/dL Final   Triglycerides 07/29/2021 62  <150 mg/dL Final   HDL 07/29/2021 >135  >40 mg/dL Final   Total CHOL/HDL Ratio 07/29/2021 NOT CALCULATED  RATIO Final   VLDL 07/29/2021 12  0 - 40 mg/dL Final   LDL Cholesterol 07/29/2021 NOT CALCULATED  0 - 99 mg/dL Final   Performed at [facility], [place] [address] [place], [place] [address]   TSH 07/29/2021 0.545  0.350 -  4.500 uIU/mL Final   Comment: Performed by a 3rd Generation assay with a functional sensitivity of <=0.01 uIU/mL. Performed at [facility], [place] [address], [place], [place] [address]    Color, Urine 07/29/2021 YELLOW  YELLOW Final   APPearance 07/29/2021 CLEAR  CLEAR Final   Specific Gravity, Urine 07/29/2021 1.005  1.005 - 1.030 Final   pH 07/29/2021 5.0  5.0 - 8.0 Final   Glucose, UA 07/29/2021 NEGATIVE  NEGATIVE mg/dL Final   Hgb urine dipstick 07/29/2021 NEGATIVE  NEGATIVE Final   Bilirubin Urine 07/29/2021 NEGATIVE  NEGATIVE Final   Ketones, ur 07/29/2021 NEGATIVE  NEGATIVE mg/dL Final   Protein, ur 07/29/2021 NEGATIVE  NEGATIVE mg/dL Final   Nitrite 07/29/2021 NEGATIVE  NEGATIVE Final   Leukocytes,Ua 07/29/2021 NEGATIVE  NEGATIVE Final   WBC, UA 07/29/2021 0-5  0 - 5 WBC/hpf Final   Bacteria, UA 07/29/2021 NONE SEEN  NONE SEEN Final   Performed at [facility], [place] [address] [place], [place] [address]   POC Amphetamine UR 07/29/2021 None Detected  NONE DETECTED (Cut Off Level 1000 ng/mL) Final   POC Secobarbital (BAR) 07/29/2021 None  Detected  NONE DETECTED (Cut Off Level 300 ng/mL) Final   POC Buprenorphine (BUP) 07/29/2021 None Detected  NONE DETECTED (Cut Off Level 10 ng/mL) Final   POC Oxazepam (BZO) 07/29/2021 None Detected  NONE DETECTED (Cut Off Level 300 ng/mL) Final   POC Cocaine UR 07/29/2021 None Detected  NONE DETECTED (Cut Off Level 300 ng/mL) Final   POC Methamphetamine UR 07/29/2021 None Detected  NONE DETECTED (Cut Off Level 1000 ng/mL) Final   POC Morphine 07/29/2021 None Detected  NONE DETECTED (Cut Off Level 300 ng/mL) Final   POC Oxycodone UR 07/29/2021 None Detected  NONE DETECTED (Cut Off Level 100 ng/mL) Final   POC Methadone UR 07/29/2021 None Detected  NONE DETECTED (Cut Off Level 300 ng/mL) Final   POC Marijuana UR 07/29/2021 Positive (A)  NONE DETECTED (Cut Off Level 50 ng/mL) Final   SARS Coronavirus 2 Ag 07/29/2021 Negative  Negative Final   RPR Ser Ql 07/29/2021 NON REACTIVE  NON REACTIVE Final   Performed at [facility], [place] [address] [place], [place] [address]   HIV Screen 4th Generation wRfx 07/29/2021 Non Reactive  Non Reactive Final   Performed at [facility], [place] [address], [place], [place] [address]   SARSCOV2ONAVIRUS 2 AG 07/29/2021 NEGATIVE  NEGATIVE Final   Comment: (NOTE) SARS-CoV-2 antigen NOT DETECTED.   Negative results are presumptive.  Negative results do not preclude SARS-CoV-2 infection and should not be used as the sole basis for treatment or other patient management decisions, including infection  control decisions, particularly in the presence of clinical signs and  symptoms consistent with COVID-19, or in those who have been in contact with the virus.  Negative results must be combined with clinical observations, patient history, and epidemiological information. The expected result is Negative.  Fact Sheet for Patients: HandmadeRecipes.com.cy  Fact Sheet for Healthcare Providers: FuneralLife.at  This  test is not yet approved or cleared by the [place] FDA and  has been authorized for detection and/or diagnosis of SARS-CoV-2 by FDA under an Emergency Use Authorization (EUA).  This EUA will remain in effect (meaning this test can be used) for the duration of  the COV  ID-19 declaration under Section 564(b)(1) of the Act, 21 U.S.C. section 360bbb-3(b)(1), unless the authorization is terminated or revoked sooner.    Admission on 07/06/2021, Discharged on 07/07/2021  Component Date Value Ref Range Status   Sodium 07/06/2021 140  135 - 145 mmol/L Final   Potassium 07/06/2021 4.1  3.5 - 5.1 mmol/L Final   Chloride 07/06/2021 106  98 - 111 mmol/L Final   CO2 07/06/2021 21 (L)  22 - 32 mmol/L Final   Glucose, Bld 07/06/2021 113 (H)  70 - 99 mg/dL Final   Glucose reference range applies only to samples taken after fasting for at least 8 hours.   BUN 07/06/2021 17  8 - 23 mg/dL Final   Creatinine, Ser 07/06/2021 1.07  0.61 - 1.24 mg/dL Final   Calcium 56/21/308610/23/2022 9.0  8.9 - 10.3 mg/dL Final   Total Protein 57/84/696210/23/2022 6.8  6.5 - 8.1 g/dL Final   Albumin 95/28/413210/23/2022 4.0  3.5 - 5.0 g/dL Final   AST 44/01/027210/23/2022 21  15 - 41 U/L Final   ALT 07/06/2021 20  0 - 44 U/L Final   Alkaline Phosphatase 07/06/2021 65  38 - 126 U/L Final   Total Bilirubin 07/06/2021 0.4  0.3 - 1.2 mg/dL Final   GFR, Estimated 07/06/2021 >60  >60 mL/min Final   Comment: (NOTE) Calculated using the CKD-EPI Creatinine Equation (2021)    Anion gap 07/06/2021 13  5 - 15 Final   Performed at [facility], [address] [place], [place] [address]   Alcohol, Ethyl (B) 07/06/2021 283 (H)  <10 mg/dL Final   Comment: (NOTE) Lowest detectable limit for serum alcohol is 10 mg/dL.  For medical purposes only. Performed at [facility], [address] [place], [place] [address]    Salicylate Lvl 07/06/2021 <7.0 (L)  7.0 - 30.0 mg/dL Final   Performed at [facility], [address] [place], [place] [address]   Acetaminophen (Tylenol), Serum 07/06/2021 <10 (L)  10 - 30 ug/mL Final   Comment: (NOTE) Therapeutic concentrations vary significantly. A range of 10-30 ug/mL  may be an effective concentration for many patients. However, some  are best treated at concentrations outside of this range. Acetaminophen concentrations >150 ug/mL at 4 hours after ingestion  and >50 ug/mL at 12 hours after ingestion are often associated with  toxic reactions.  Performed at [facility], [address] [place], [place] [address]    Opiates 07/06/2021 NONE DETECTED  NONE DETECTED Final   Cocaine 07/06/2021 NONE DETECTED  NONE DETECTED Final   Benzodiazepines 07/06/2021 NONE DETECTED  NONE DETECTED Final   Amphetamines 07/06/2021 NONE DETECTED  NONE DETECTED Final   Tetrahydrocannabinol 07/06/2021 POSITIVE (A)  NONE DETECTED Final   Barbiturates 07/06/2021 NONE DETECTED  NONE DETECTED Final   Comment: (NOTE) DRUG SCREEN FOR MEDICAL PURPOSES ONLY.  IF CONFIRMATION IS NEEDED FOR ANY PURPOSE, NOTIFY LAB WITHIN 5 DAYS.  LOWEST DETECTABLE LIMITS FOR URINE DRUG SCREEN Drug Class                     Cutoff (ng/mL) Amphetamine and metabolites    1000 Barbiturate and metabolites    200 Benzodiazepine                 200 Tricyclics and metabolites     300 Opiates and metabolites        300 Cocaine and metabolites        300  THC                            50 Performed at [facility], [place] [address], [place], [place] [address]    SARS Coronavirus 2 by RT PCR 07/06/2021 NEGATIVE  NEGATIVE Final   Comment: (NOTE) SARS-CoV-2 target nucleic acids are NOT DETECTED.  The SARS-CoV-2 RNA is generally detectable in upper respiratory specimens during the acute phase of infection. The lowest concentration of SARS-CoV-2 viral copies this assay can detect is 138 copies/mL. A negative result does not preclude SARS-Cov-2 infection and should not be used as the sole basis for  treatment or other patient management decisions. A negative result may occur with  improper specimen collection/handling, submission of specimen other than nasopharyngeal swab, presence of viral mutation(s) within the areas targeted by this assay, and inadequate number of viral copies(<138 copies/mL). A negative result must be combined with clinical observations, patient history, and epidemiological information. The expected result is Negative.  Fact Sheet for Patients:  EntrepreneurPulse.com.au  Fact Sheet for Healthcare Providers:  IncredibleEmployment.be  This test is no                          t yet approved or cleared by the [place] FDA and  has been authorized for detection and/or diagnosis of SARS-CoV-2 by FDA under an Emergency Use Authorization (EUA). This EUA will remain  in effect (meaning this test can be used) for the duration of the COVID-19 declaration under Section 564(b)(1) of the Act, 21 U.S.C.section 360bbb-3(b)(1), unless the authorization is terminated  or revoked sooner.       Influenza A by PCR 07/06/2021 NEGATIVE  NEGATIVE Final   Influenza B by PCR 07/06/2021 NEGATIVE  NEGATIVE Final   Comment: (NOTE) The Xpert Xpress SARS-CoV-2/FLU/RSV plus assay is intended as an aid in the diagnosis of influenza from Nasopharyngeal swab specimens and should not be used as a sole basis for treatment. Nasal washings and aspirates are unacceptable for Xpert Xpress SARS-CoV-2/FLU/RSV testing.  Fact Sheet for Patients: EntrepreneurPulse.com.au  Fact Sheet for Healthcare Providers: IncredibleEmployment.be  This test is not yet approved or cleared by the [place] FDA and has been authorized for detection and/or diagnosis of SARS-CoV-2 by FDA under an Emergency Use Authorization (EUA). This EUA will remain in effect (meaning this test can be used) for the duration of the COVID-19  declaration under Section 564(b)(1) of the Act, 21 U.S.C. section 360bbb-3(b)(1), unless the authorization is terminated or revoked.  Performed at [facility], [place] [address] [place], [place] [address]    WBC 07/06/2021 5.7  4.0 - 10.5 K/uL Final   RBC 07/06/2021 5.37  4.22 - 5.81 MIL/uL Final   Hemoglobin 07/06/2021 15.7  13.0 - 17.0 g/dL Final   HCT 07/06/2021 48.5  39.0 - 52.0 % Final   MCV 07/06/2021 90.3  80.0 - 100.0 fL Final   MCH 07/06/2021 29.2  26.0 - 34.0 pg Final   MCHC 07/06/2021 32.4  30.0 - 36.0 g/dL Final   RDW 07/06/2021 14.5  11.5 - 15.5 % Final   Platelets 07/06/2021 307  150 - 400 K/uL Final   nRBC 07/06/2021 0.0  0.0 - 0.2 % Final   Neutrophils Relative % 07/06/2021 53  % Final   Neutro Abs 07/06/2021 3.1  1.7 - 7.7 K/uL Final   Lymphocytes Relative 07/06/2021 35  % Final  Lymphs Abs 07/06/2021 2.0  0.7 - 4.0 K/uL Final   Monocytes Relative 07/06/2021 7  % Final   Monocytes Absolute 07/06/2021 0.4  0.1 - 1.0 K/uL Final   Eosinophils Relative 07/06/2021 3  % Final   Eosinophils Absolute 07/06/2021 0.2  0.0 - 0.5 K/uL Final   Basophils Relative 07/06/2021 1  % Final   Basophils Absolute 07/06/2021 0.0  0.0 - 0.1 K/uL Final   Immature Granulocytes 07/06/2021 1  % Final   Abs Immature Granulocytes 07/06/2021 0.06  0.00 - 0.07 K/uL Final   Performed at [facility], [place] [address] [place], [place] [address]   Color, Urine 07/06/2021 STRAW (A)  YELLOW Final   APPearance 07/06/2021 CLEAR  CLEAR Final   Specific Gravity, Urine 07/06/2021 1.010  1.005 - 1.030 Final   pH 07/06/2021 5.0  5.0 - 8.0 Final   Glucose, UA 07/06/2021 NEGATIVE  NEGATIVE mg/dL Final   Hgb urine dipstick 07/06/2021 NEGATIVE  NEGATIVE Final   Bilirubin Urine 07/06/2021 NEGATIVE  NEGATIVE Final   Ketones, ur 07/06/2021 NEGATIVE  NEGATIVE mg/dL Final   Protein, ur 07/06/2021 NEGATIVE  NEGATIVE mg/dL Final   Nitrite 07/06/2021 NEGATIVE  NEGATIVE Final   Leukocytes,Ua 07/06/2021  NEGATIVE  NEGATIVE Final   Performed at [facility], [place] [address], [place], [place] [address]  Admission on 02/21/2021, Discharged on 02/22/2021  Component Date Value Ref Range Status   WBC 02/21/2021 6.5  4.0 - 10.5 K/uL Final   RBC 02/21/2021 4.61  4.22 - 5.81 MIL/uL Final   Hemoglobin 02/21/2021 13.3  13.0 - 17.0 g/dL Final   HCT 02/21/2021 40.4  39.0 - 52.0 % Final   MCV 02/21/2021 87.6  80.0 - 100.0 fL Final   MCH 02/21/2021 28.9  26.0 - 34.0 pg Final   MCHC 02/21/2021 32.9  30.0 - 36.0 g/dL Final   RDW 02/21/2021 12.7  11.5 - 15.5 % Final   Platelets 02/21/2021 605 (H)  150 - 400 K/uL Final   nRBC 02/21/2021 0.0  0.0 - 0.2 % Final   Neutrophils Relative % 02/21/2021 49  % Final   Neutro Abs 02/21/2021 3.2  1.7 - 7.7 K/uL Final   Lymphocytes Relative 02/21/2021 43  % Final   Lymphs Abs 02/21/2021 2.8  0.7 - 4.0 K/uL Final   Monocytes Relative 02/21/2021 6  % Final   Monocytes Absolute 02/21/2021 0.4  0.1 - 1.0 K/uL Final   Eosinophils Relative 02/21/2021 1  % Final   Eosinophils Absolute 02/21/2021 0.1  0.0 - 0.5 K/uL Final   Basophils Relative 02/21/2021 0  % Final   Basophils Absolute 02/21/2021 0.0  0.0 - 0.1 K/uL Final   Immature Granulocytes 02/21/2021 1  % Final   Abs Immature Granulocytes 02/21/2021 0.04  0.00 - 0.07 K/uL Final   Performed at [facility], [place] [address], [place], [place] [address]   Sodium 02/21/2021 140  135 - 145 mmol/L Final   Potassium 02/21/2021 3.2 (L)  3.5 - 5.1 mmol/L Final   Chloride 02/21/2021 105  98 - 111 mmol/L Final   CO2 02/21/2021 22  22 - 32 mmol/L Final   Glucose, Bld 02/21/2021 102 (H)  70 - 99 mg/dL Final   Glucose reference range applies only to samples taken after fasting for at least 8 hours.   BUN 02/21/2021 10  8 - 23 mg/dL Final   Creatinine, Ser 02/21/2021 0.80  0.61 - 1.24 mg/dL Final   Calcium  02/21/2021 9.5  8.9 - 10.3 mg/dL Final   GFR, Estimated 02/21/2021 >60  >60 mL/min Final   Comment:  (NOTE) Calculated using the CKD-EPI Creatinine Equation (2021)    Anion gap 02/21/2021 13  5 - 15 Final   Performed at [facility], [address] [place], [place] [address]   Specimen Description 02/22/2021    Final                   Value:JOINT FLUID RIGHT ELBOW Performed at [facility], [address], [place], [place] [address]    Special Requests 02/22/2021    Final                   Value:Normal Performed at [facility], [address], [place], [place] [address]    Gram Stain 02/22/2021    Final                   Value:RARE WBC PRESENT,BOTH PMN AND MONONUCLEAR NO ORGANISMS SEEN    Culture 02/22/2021    Final                   Value:NO GROWTH 3 DAYS Performed at [facility], [address] [place], [place] [address]    Report Status 02/22/2021 02/25/2021 FINAL   Final   Color, Synovial 02/22/2021 RED (A)  YELLOW Final   Appearance-Synovial 02/22/2021 TURBID (A)  CLEAR Final   Crystals, Fluid 02/22/2021 EXTRACELLULAR MONOSODIUM URATE CRYSTALS   Final   WBC, Synovial 02/22/2021 7,070 (H)  0 - 200 /cu mm Final   Neutrophil, Synovial 02/22/2021 55 (H)  0 - 25 % Final   Lymphocytes-Synovial Fld 02/22/2021 6  0 - 20 % Final   Monocyte-Macrophage-Synovial Fluid 02/22/2021 39 (L)  50 - 90 % Final   Eosinophils-Synovial 02/22/2021 0  0 - 1 % Final   Other Cells-SYN 02/22/2021 OTHER CELLS IDENTIFIED AS SYNOVIAL LINING CELLS   Final   Performed at [facility], [address], [place], [place] [address]  Admission on 02/09/2021, Discharged on 02/10/2021  Component Date Value Ref Range Status   Lactic Acid, Venous 02/10/2021 1.1  0.5 - 1.9 mmol/L Final   Performed at [facility], [address], [place], [place] [address]   Lactic Acid, Venous 02/09/2021 0.8  0.5 - 1.9 mmol/L Final   Performed at [facility], [address], [place], [place] [address]    Sodium 02/09/2021 136  135 - 145 mmol/L Final   Potassium 02/09/2021 3.2 (L)  3.5 - 5.1 mmol/L Final   Chloride 02/09/2021 98  98 - 111 mmol/L Final   CO2 02/09/2021 27  22 - 32 mmol/L Final   Glucose, Bld 02/09/2021 103 (H)  70 - 99 mg/dL Final   Glucose reference range applies only to samples taken after fasting for at least 8 hours.   BUN 02/09/2021 12  8 - 23 mg/dL Final   Creatinine, Ser 02/09/2021 0.81  0.61 - 1.24 mg/dL Final   Calcium 38/46/6599 9.1  8.9 - 10.3 mg/dL Final   Total Protein 35/70/1779 7.3  6.5 - 8.1 g/dL Final   Albumin 39/11/90 3.4 (L)  3.5 - 5.0 g/dL Final   AST 33/00/7622 13 (L)  15 - 41 U/L Final   ALT 02/09/2021 13  0 - 44 U/L Final   Alkaline Phosphatase 02/09/2021 60  38 - 126 U/L Final  Total Bilirubin 02/09/2021 1.1  0.3 - 1.2 mg/dL Final   GFR, Estimated 02/09/2021 >60  >60 mL/min Final   Comment: (NOTE) Calculated using the CKD-EPI Creatinine Equation (2021)    Anion gap 02/09/2021 11  5 - 15 Final   Performed at [facility], [place] [address], [place], [place] [address]   WBC 02/09/2021 12.8 (H)  4.0 - 10.5 K/uL Final   RBC 02/09/2021 4.31  4.22 - 5.81 MIL/uL Final   Hemoglobin 02/09/2021 12.7 (L)  13.0 - 17.0 g/dL Final   HCT 02/09/2021 37.8 (L)  39.0 - 52.0 % Final   MCV 02/09/2021 87.7  80.0 - 100.0 fL Final   MCH 02/09/2021 29.5  26.0 - 34.0 pg Final   MCHC 02/09/2021 33.6  30.0 - 36.0 g/dL Final   RDW 02/09/2021 12.3  11.5 - 15.5 % Final   Platelets 02/09/2021 368  150 - 400 K/uL Final   nRBC 02/09/2021 0.0  0.0 - 0.2 % Final   Neutrophils Relative % 02/09/2021 75  % Final   Neutro Abs 02/09/2021 9.6 (H)  1.7 - 7.7 K/uL Final   Lymphocytes Relative 02/09/2021 16  % Final   Lymphs Abs 02/09/2021 2.1  0.7 - 4.0 K/uL Final   Monocytes Relative 02/09/2021 8  % Final   Monocytes Absolute 02/09/2021 1.0  0.1 - 1.0 K/uL Final   Eosinophils Relative 02/09/2021 0  % Final   Eosinophils Absolute 02/09/2021 0.0  0.0 - 0.5 K/uL  Final   Basophils Relative 02/09/2021 0  % Final   Basophils Absolute 02/09/2021 0.0  0.0 - 0.1 K/uL Final   Immature Granulocytes 02/09/2021 1  % Final   Abs Immature Granulocytes 02/09/2021 0.09 (H)  0.00 - 0.07 K/uL Final   Performed at [facility], [place] [address] [place], [place] [address]   Color, Urine 02/10/2021 YELLOW  YELLOW Final   APPearance 02/10/2021 CLEAR  CLEAR Final   Specific Gravity, Urine 02/10/2021 1.015  1.005 - 1.030 Final   pH 02/10/2021 6.0  5.0 - 8.0 Final   Glucose, UA 02/10/2021 NEGATIVE  NEGATIVE mg/dL Final   Hgb urine dipstick 02/10/2021 NEGATIVE  NEGATIVE Final   Bilirubin Urine 02/10/2021 NEGATIVE  NEGATIVE Final   Ketones, ur 02/10/2021 20 (A)  NEGATIVE mg/dL Final   Protein, ur 02/10/2021 NEGATIVE  NEGATIVE mg/dL Final   Nitrite 02/10/2021 NEGATIVE  NEGATIVE Final   Leukocytes,Ua 02/10/2021 NEGATIVE  NEGATIVE Final   Performed at [facility] [address], [place], [place] [address]   Specimen Description 02/09/2021    Final                   Value:BLOOD LEFT ANTECUBITAL Performed at [place] [address], [place], [place] [address]    Special Requests 02/09/2021    Final                   Value:BOTTLES DRAWN AEROBIC AND ANAEROBIC Blood Culture adequate volume Performed at [facility] [address], [place], [place] [address]    Culture 02/09/2021    Final                   Value:NO GROWTH 5 DAYS Performed at [facility] [address], [place], [place] [address]    Report Status 02/09/2021 02/15/2021 FINAL   Final   Specimen Description 02/09/2021    Final  Value:BLOOD LEFT ANTECUBITAL Performed at [facility] [address] [place], [place] [address]    Special Requests 02/09/2021    Final                   Value:BOTTLES DRAWN AEROBIC AND ANAEROBIC Blood Culture adequate volume Performed at [facility] [address] [place], [place] [address]    Culture 02/09/2021    Final                   Value:NO GROWTH 5 DAYS Performed at [facility] [address] [place], [place] [address]    Report Status 02/09/2021 02/15/2021 FINAL   Final   Sed Rate 02/09/2021 89 (H)  0 - 16 mm/hr Final   Performed at [facility], [place] [address], [place], [place] [address]   CRP 02/09/2021 14.7 (H)  <1.0 mg/dL Final   Performed at [place] [address] [place],  [address]    Blood Alcohol level:  Lab Results  Component Value Date   ETH 277 (H) 07/29/2021   ETH 283 (H) [id number]    Metabolic Disorder Labs: Lab Results  Component Value Date   HGBA1C 5.4 07/29/2021   MPG 108.28 07/29/2021   MPG 108.28 11/26/2020   No results found for: PROLACTIN Lab Results  Component Value Date   CHOL 259 (H) 07/29/2021   TRIG 62 07/29/2021   HDL >135 07/29/2021   CHOLHDL NOT CALCULATED 07/29/2021   VLDL 12 07/29/2021   LDLCALC NOT CALCULATED 07/29/2021    Therapeutic Lab Levels: No results found for: LITHIUM No results found for: VALPROATE No components found for:  CBMZ  Physical Findings   Flowsheet Row ED from 07/29/2021 in [facility] ED from 07/06/2021 in [facility] ED from 06/23/2021 in [facility] DEPT  C-SSRS RISK CATEGORY Low Risk High Risk No Risk        Musculoskeletal  Strength & Muscle Tone: within normal limits Gait & Station: normal Patient leans: N/A  Psychiatric Specialty Exam  Presentation  General Appearance: Appropriate for Environment; Casual  Eye Contact:Good  Speech:Clear and Coherent; Normal Rate  Speech Volume:Normal  Handedness:Right   Mood and Affect  Mood:Depressed  Affect:Congruent   Thought Process  Thought Processes:Coherent  Descriptions of  Associations:Intact  Orientation:Full (Time, Place and Person)  Thought Content:Logical  Diagnosis of Schizophrenia or Schizoaffective disorder in past: No  Duration of Psychotic Symptoms: Less than six months (Endorsed auditory and visual hallucinations)   Hallucinations:Hallucinations: None  Ideas of Reference:None  Suicidal Thoughts:Suicidal Thoughts: Yes, Passive SI Passive Intent and/or Plan: Without Intent; With Means to Carry Out; With Plan  Homicidal Thoughts:Homicidal Thoughts: No   Sensorium  Memory:Immediate Good; Recent Good; Remote Good  Judgment:Intact  Insight:Fair   Executive Functions  Concentration:Good  Attention Span:Good  [place] of Knowledge:Good  Language:Good   Psychomotor Activity  Psychomotor Activity:Psychomotor Activity: Normal   Assets  Assets:Communication Skills; Desire for Improvement; Financial Resources/Insurance; Housing; Physical Health; Resilience; Social Support   Sleep  Sleep:Sleep: Good Number of Hours of Sleep: 8   No data recorded  Physical Exam  Physical Exam Vitals and nursing note reviewed.  Constitutional:      General: He is not in acute distress.    Appearance: Normal appearance. He is well-developed.  HENT:     Head: Normocephalic and atraumatic.  Eyes:  General:        Right eye: No discharge.        Left eye: No discharge.     Conjunctiva/sclera: Conjunctivae normal.  Cardiovascular:     Rate and Rhythm: Normal rate and regular rhythm.     Heart sounds: No murmur heard. Pulmonary:     Effort: Pulmonary effort is normal. No respiratory distress.     Breath sounds: Normal breath sounds.  Abdominal:     Palpations: Abdomen is soft.     Tenderness: There is no abdominal tenderness.  Musculoskeletal:        General: No swelling. Normal range of motion.     Cervical back: Neck supple.  Skin:    General: Skin is warm and dry.     Capillary Refill: Capillary refill takes less than 2  seconds.     Coloration: Skin is not jaundiced or pale.  Neurological:     Mental Status: He is alert and oriented to person, place, and time.  Psychiatric:        Attention and Perception: Attention and perception normal.        Mood and Affect: Mood is depressed.        Speech: Speech normal.        Behavior: Behavior normal. Behavior is cooperative.        Thought Content: Thought content includes suicidal ideation. Thought content does not include suicidal plan.        Cognition and Memory: Cognition normal.        Judgment: Judgment is impulsive.   Review of Systems  Constitutional: Negative.   HENT: Negative.    Eyes: Negative.   Respiratory: Negative.    Cardiovascular: Negative.   Musculoskeletal: Negative.   Skin: Negative.   Neurological: Negative.   Psychiatric/Behavioral:  Positive for depression and suicidal ideas.   Blood pressure 114/75, pulse 80, temperature 98.7 F (37.1 C), temperature source Tympanic, resp. rate 16, SpO2 95 %. There is no height or weight on file to calculate BMI.  Treatment Plan Summary: Daily contact with patient to assess and evaluate symptoms and progress in treatment and Medication management  Disposition: Ongoing, Patient has been accepted to [facility] for residential substance use treatment on  Monday, November 21.  14-day sample of medications have been ordered for: Amlodipine 5 mg daily, gabapentin 200 mg 3 times daily, Indocin 25 mg 3 times daily as needed, Zoloft 50 mg daily, trazodone 50 mg nightly as needed.  Printed prescriptions were also provided for medications including 1 month refill.   Revonda Humphrey, NP 08/02/2021 3:17 PM

## 2021-08-02 NOTE — ED Notes (Signed)
Patient in day room watching TV - no complaints or sxs of distress - will continue to monitor for safety

## 2021-08-02 NOTE — Progress Notes (Signed)
Patient is alert and oriented X 3, denies SI, HI and AVH at this time. Patient received morning medications, tremors present but has decreased since yesterday by observation. Patient slept well during the night states, Trazodone helped. Patient attend group, pleasant on unit with staff and peer.

## 2021-08-02 NOTE — ED Notes (Signed)
Patient resting quietly in bed with eyes closed, earlier he was snoring loudly, has since changed positions and is no longer snoring. Respirations are even and unlabored. No S/S of distress or discomfort. Will continue to monitor for safety.

## 2021-08-02 NOTE — Progress Notes (Signed)
Patient remains alert, and oriented X 3. Patient states, " Today I am feeling better." Patient was active today on unit, participating in groups and smiling when talking to staff. Patient denies SI/ HI and AVH. Patient appetite during the day shift has been adequate, medication compliant. Last set of vitals for this shift WNL. Nursing staff will continue to monitor.

## 2021-08-02 NOTE — Group Note (Signed)
Group Topic: Healthy Self Image and Positive Change  Group Date: 08/02/2021 Start Time: 1200 End Time: 1240 Facilitators: Levander Campion  Department: [facility]  Number of Participants: 2  Group Focus: anger management Treatment Modality:  Individual Therapy and Solution-Focused Therapy Interventions utilized were patient education Purpose: enhance coping skills and express feelings  Name: Daylen Diaz-Rivera Date of Birth: 04/19/1961  MR: [id number]    Level of Participation: active Quality of Participation: attentive and cooperative Interactions with others: gave feedback Mood/Affect: appropriate Triggers (if applicable): n/a Cognition: coherent/clear Progress: Moderate Response: n/a Plan: follow-up needed  Patients Problems:  Patient Active Problem List   Diagnosis Date Noted   Suicidal ideation 07/30/2021   Major depressive disorder, recurrent episode, severe (HCC) 07/07/2021   Substance induced mood disorder (HCC) 07/07/2021   Alcohol use 07/07/2021   Foot abscess, left 11/24/2020   Anxiety 11/24/2020   Hyperkalemia 11/24/2020

## 2021-08-02 NOTE — Progress Notes (Signed)
Patient laying in bed with eyes closed, respirations even and unlabored no objective signs of discomfort. Nursing staff will continue to monitor.

## 2021-08-03 DIAGNOSIS — F1994 Other psychoactive substance use, unspecified with psychoactive substance-induced mood disorder: Secondary | ICD-10-CM | POA: Diagnosis not present

## 2021-08-03 DIAGNOSIS — F4323 Adjustment disorder with mixed anxiety and depressed mood: Secondary | ICD-10-CM | POA: Diagnosis not present

## 2021-08-03 DIAGNOSIS — R45851 Suicidal ideations: Secondary | ICD-10-CM | POA: Diagnosis not present

## 2021-08-03 DIAGNOSIS — F332 Major depressive disorder, recurrent severe without psychotic features: Secondary | ICD-10-CM | POA: Diagnosis not present

## 2021-08-03 NOTE — Progress Notes (Signed)
Patient is alert and oriented X 3, denies SI, HI and AVH at this time. Pleasant and calm affect. Patient denies complaints at this time, sleep adequate, appetite adequate at this time. Patient able to perform all ADLs. Nursing staff will continue to monitor.

## 2021-08-03 NOTE — ED Notes (Signed)
Pt eating dinner

## 2021-08-03 NOTE — ED Notes (Signed)
Pt eating breakfast 

## 2021-08-03 NOTE — Progress Notes (Signed)
Patient resting safely, respirations even and unlabored, no objective signs of distress. Nursing staff will continue to monitor.

## 2021-08-03 NOTE — ED Notes (Signed)
Patient resting well - no sxs of distress - will continue to monitor for safety ?

## 2021-08-03 NOTE — ED Notes (Signed)
Pt sitting in dining room watching TV. Pt A&O x4, calm and cooperative. Pt denies current SI/HI/AVH. Pt states he is, "feeling much better." Pt denies any current needs. No signs of acute distress noted. Will continue to monitor for safety.

## 2021-08-03 NOTE — Progress Notes (Signed)
Earlier intervention of Tylenol effective as pain now 0/10.

## 2021-08-03 NOTE — ED Notes (Signed)
Patient resting well with no sxs of distress - will continue to monitor for safety 

## 2021-08-03 NOTE — ED Notes (Signed)
Pt eating lunch

## 2021-08-03 NOTE — ED Notes (Signed)
Pt asleep in bed. Respirations even and unlabored. Will continue to monitor for safety. ?

## 2021-08-03 NOTE — ED Provider Notes (Addendum)
Behavioral Health Progress Note  Date and Time: 08/03/2021 1:10 PM Name: Dathan Banaszewski MRN:  [id number]  Subjective:  Willie Collins, 60 y.o., male patient seen face to face by this provider and consulted with Dr. Dwyane Dee; and  chart reviewed on 08/03/21.    He has a reported psychiatric history of alcohol use disorder and depression.  He presented to the [facility] on 11/15 with SI and alcohol use.  He was admitted to [facility] on 11/16 for continued treatment of mood and alcohol detox.  Patient completed Ativan taper for detox.  He has been calm and cooperative on the unit.  On assessment patient is walking back to his room.  He makes good eye contact and is well-groomed.  Patient is speaking in a clear tone at moderate volume, and normal pace. He is alert/oriented x 4; calm/cooperative; and mood congruent with affect.  He denies depression and anxiety.  States he is sleeping well and has no concerns with appetite.  Reports, "I am doing so much better and I am so thankful for all of the help that I have received".There is no indication that he is currently responding to internal/external stimuli or experiencing delusional thought content.  He denies SI/HI/AVH.  He continues to tolerate medications without any adverse reactions.  He denies any withdrawal symptoms or health concerns. He continues to attend and participate in group sessions.   Patient continues to look forward to being discharged tomorrow and being transported to [facility] for residential treatment.  Patient requested a note that showed his dates of admission.  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    Diagnosis:  Final diagnoses:  Substance induced mood disorder ([place])    Total Time spent with patient: 30 minutes  Past Psychiatric History: see h&p Past Medical History:  Past Medical History:  Diagnosis Date   Hypertension     Past Surgical History:  Procedure Laterality Date   IRRIGATION AND DEBRIDEMENT FOOT  Left 11/25/2020   Procedure: IRRIGATION AND DEBRIDEMENT FOOT;  Surgeon: Felipa Furnace, DPM;  Location: [facility];  Service: Podiatry;  Laterality: Left;   Family History: No family history on file. Family Psychiatric  History: see h&p Social History:  Social History   Substance and Sexual Activity  Alcohol Use Yes   Comment: weekends     Social History   Substance and Sexual Activity  Drug Use Never    Social History   Socioeconomic History   Marital status: Single    Spouse name: Not on file   Number of children: Not on file   Years of education: Not on file   Highest education level: Not on file  Occupational History   Not on file  Tobacco Use   Smoking status: Never   Smokeless tobacco: Never  Vaping Use   Vaping Use: Never used  Substance and Sexual Activity   Alcohol use: Yes    Comment: weekends   Drug use: Never   Sexual activity: Yes    Partners: Female  Other Topics Concern   Not on file  Social History Narrative   Not on file   Social Determinants of Health   Financial Resource Strain: Not on file  Food Insecurity: Not on file  Transportation Needs: Not on file  Physical Activity: Not on file  Stress: Not on file  Social Connections: Not on file   SDOH:  SDOH Screenings   Alcohol Screen: Not on file  Depression [id number]): Not on file  Financial Resource Strain: Not on file  Food Insecurity: Not on file  Housing: Not on file  Physical Activity: Not on file  Social Connections: Not on file  Stress: Not on file  Tobacco Use: Low Risk    Smoking Tobacco Use: Never   Smokeless Tobacco Use: Never   Passive Exposure: Not on file  Transportation Needs: Not on file   Additional Social History:    Pain Medications: None History of alcohol / drug use?: Yes Longest period of sobriety (when/how long): 6 months Negative Consequences of Use: Financial, Personal relationships Withdrawal Symptoms: Tremors, Irritability, Nausea / Vomiting,  Agitation Name of Substance 1: ETOH (Alcohol) 1 - Age of First Use: Unknown 1 - Amount (size/oz): Two fifths of alcohol 1 - Frequency: Daily 1 - Duration: "Many years" 1 - Last Use / Amount: Tuesday; 2 fifths of liquor 1 - Method of Aquiring: Purchases from store 1- Route of Use: Oral; Drinking      Sleep: Good  Appetite:  Good  Current Medications:  Current Facility-Administered Medications  Medication Dose Route Frequency Provider Last Rate Last Admin   acetaminophen (TYLENOL) tablet 650 mg  650 mg Oral Q6H PRN Ardis Hughs, NP   650 mg at 08/03/21 1242   alum & mag hydroxide-simeth (MAALOX/MYLANTA) 200-200-20 MG/5ML suspension 30 mL  30 mL Oral Q4H PRN Ardis Hughs, NP       amLODipine (NORVASC) tablet 5 mg  5 mg Oral Daily Estella Husk, MD   5 mg at 08/03/21 1109   gabapentin (NEURONTIN) capsule 200 mg  200 mg Oral TID Estella Husk, MD   200 mg at 08/03/21 1103   indomethacin (INDOCIN) capsule 25 mg  25 mg Oral TID PRN Estella Husk, MD       magnesium hydroxide (MILK OF MAGNESIA) suspension 30 mL  30 mL Oral Daily PRN Ardis Hughs, NP   30 mL at 08/01/21 1031   multivitamin with minerals tablet 1 tablet  1 tablet Oral Daily Ardis Hughs, NP   1 tablet at 08/03/21 1103   sertraline (ZOLOFT) tablet 50 mg  50 mg Oral Daily Penn, Cranston Neighbor, NP   50 mg at 08/03/21 1108   thiamine tablet 100 mg  100 mg Oral Daily Ardis Hughs, NP   100 mg at 08/03/21 1109   traZODone (DESYREL) tablet 100 mg  100 mg Oral QHS PRN Nwoko, Uchenna E, PA       Current Outpatient Medications  Medication Sig Dispense Refill   amLODipine (NORVASC) 5 MG tablet Take 1 tablet (5 mg total) by mouth daily. 30 tablet 1   indomethacin (INDOCIN) 25 MG capsule Take 1 capsule (25 mg total) by mouth 3 (three) times daily with meals. 42 capsule 0   sertraline (ZOLOFT) 50 MG tablet Take 1 tablet (50 mg total) by mouth daily. 14 tablet 0   traZODone (DESYREL) 50 MG tablet  Take 1 tablet (50 mg total) by mouth at bedtime as needed for sleep. 14 tablet 0   traZODone (DESYREL) 50 MG tablet Take 1 tablet (50 mg total) by mouth at bedtime as needed for sleep. 30 tablet 1   amLODipine (NORVASC) 5 MG tablet Take 1 tablet (5 mg total) by mouth daily. 14 tablet 1   gabapentin (NEURONTIN) 100 MG capsule Take 2 capsules (200 mg total) by mouth 3 (three) times daily. 180 capsule 1   gabapentin (NEURONTIN) 100 MG capsule Take 2 capsules (200 mg total) by mouth  3 (three) times daily. 84 capsule 0   indomethacin (INDOCIN) 25 MG capsule Take 1 capsule (25 mg total) by mouth 3 (three) times daily as needed for moderate pain or mild pain (gout pain). 90 capsule 1   sertraline (ZOLOFT) 50 MG tablet Take 1 tablet (50 mg total) by mouth daily. 30 tablet 1    Labs  Lab Results:  Admission on 07/29/2021  Component Date Value Ref Range Status   SARS Coronavirus 2 by RT PCR 07/29/2021 NEGATIVE  NEGATIVE Final   Comment: (NOTE) SARS-CoV-2 target nucleic acids are NOT DETECTED.  The SARS-CoV-2 RNA is generally detectable in upper respiratory specimens during the acute phase of infection. The lowest concentration of SARS-CoV-2 viral copies this assay can detect is 138 copies/mL. A negative result does not preclude SARS-Cov-2 infection and should not be used as the sole basis for treatment or other patient management decisions. A negative result may occur with  improper specimen collection/handling, submission of specimen other than nasopharyngeal swab, presence of viral mutation(s) within the areas targeted by this assay, and inadequate number of viral copies(<138 copies/mL). A negative result must be combined with clinical observations, patient history, and epidemiological information. The expected result is Negative.  Fact Sheet for Patients:  EntrepreneurPulse.com.au  Fact Sheet for Healthcare Providers:  IncredibleEmployment.be  This test  is no                          t yet approved or cleared by the [place] FDA and  has been authorized for detection and/or diagnosis of SARS-CoV-2 by FDA under an Emergency Use Authorization (EUA). This EUA will remain  in effect (meaning this test can be used) for the duration of the COVID-19 declaration under Section 564(b)(1) of the Act, 21 U.S.C.section 360bbb-3(b)(1), unless the authorization is terminated  or revoked sooner.       Influenza A by PCR 07/29/2021 NEGATIVE  NEGATIVE Final   Influenza B by PCR 07/29/2021 NEGATIVE  NEGATIVE Final   Comment: (NOTE) The Xpert Xpress SARS-CoV-2/FLU/RSV plus assay is intended as an aid in the diagnosis of influenza from Nasopharyngeal swab specimens and should not be used as a sole basis for treatment. Nasal washings and aspirates are unacceptable for Xpert Xpress SARS-CoV-2/FLU/RSV testing.  Fact Sheet for Patients: EntrepreneurPulse.com.au  Fact Sheet for Healthcare Providers: IncredibleEmployment.be  This test is not yet approved or cleared by the [place] FDA and has been authorized for detection and/or diagnosis of SARS-CoV-2 by FDA under an Emergency Use Authorization (EUA). This EUA will remain in effect (meaning this test can be used) for the duration of the COVID-19 declaration under Section 564(b)(1) of the Act, 21 U.S.C. section 360bbb-3(b)(1), unless the authorization is terminated or revoked.  Performed at [facility], [place] [address], [place], [place] [address]    WBC 07/29/2021 4.8  4.0 - 10.5 K/uL Final   RBC 07/29/2021 5.45  4.22 - 5.81 MIL/uL Final   Hemoglobin 07/29/2021 16.5  13.0 - 17.0 g/dL Final   HCT 07/29/2021 48.6  39.0 - 52.0 % Final   MCV 07/29/2021 89.2  80.0 - 100.0 fL Final   MCH 07/29/2021 30.3  26.0 - 34.0 pg Final   MCHC 07/29/2021 34.0  30.0 - 36.0 g/dL Final   RDW 07/29/2021 15.0  11.5 - 15.5 % Final   Platelets 07/29/2021 289  150 - 400  K/uL Final   nRBC 07/29/2021 0.0  0.0 - 0.2 %  Final   Neutrophils Relative % 07/29/2021 52  % Final   Neutro Abs 07/29/2021 2.5  1.7 - 7.7 K/uL Final   Lymphocytes Relative 07/29/2021 40  % Final   Lymphs Abs 07/29/2021 2.0  0.7 - 4.0 K/uL Final   Monocytes Relative 07/29/2021 6  % Final   Monocytes Absolute 07/29/2021 0.3  0.1 - 1.0 K/uL Final   Eosinophils Relative 07/29/2021 2  % Final   Eosinophils Absolute 07/29/2021 0.1  0.0 - 0.5 K/uL Final   Basophils Relative 07/29/2021 0  % Final   Basophils Absolute 07/29/2021 0.0  0.0 - 0.1 K/uL Final   Immature Granulocytes 07/29/2021 0  % Final   Abs Immature Granulocytes 07/29/2021 0.02  0.00 - 0.07 K/uL Final   Performed at [facility], [address] [place], [place] [address]   Sodium 07/29/2021 139  135 - 145 mmol/L Final   Potassium 07/29/2021 3.8  3.5 - 5.1 mmol/L Final   Chloride 07/29/2021 99  98 - 111 mmol/L Final   CO2 07/29/2021 25  22 - 32 mmol/L Final   Glucose, Bld 07/29/2021 74  70 - 99 mg/dL Final   Glucose reference range applies only to samples taken after fasting for at least 8 hours.   BUN 07/29/2021 8  8 - 23 mg/dL Final   Creatinine, Ser 07/29/2021 0.75  0.61 - 1.24 mg/dL Final   Calcium 62/22/9798 9.2  8.9 - 10.3 mg/dL Final   Total Protein 92/07/9416 7.1  6.5 - 8.1 g/dL Final   Albumin 40/81/4481 4.2  3.5 - 5.0 g/dL Final   AST 85/63/1497 55 (H)  15 - 41 U/L Final   ALT 07/29/2021 50 (H)  0 - 44 U/L Final   Alkaline Phosphatase 07/29/2021 61  38 - 126 U/L Final   Total Bilirubin 07/29/2021 1.1  0.3 - 1.2 mg/dL Final   GFR, Estimated 07/29/2021 >60  >60 mL/min Final   Comment: (NOTE) Calculated using the CKD-EPI Creatinine Equation (2021)    Anion gap 07/29/2021 15  5 - 15 Final   Performed at [facility], [address], [place], [place] [address]   Hgb A1c MFr Bld 07/29/2021 5.4  4.8 - 5.6 % Final   Comment: (NOTE) Pre diabetes:          5.7%-6.4%  Diabetes:              >6.4%  Glycemic  control for   <7.0% adults with diabetes    Mean Plasma Glucose 07/29/2021 108.28  mg/dL Final   Performed at [facility], [address], [place], [place] [address]   Magnesium 07/29/2021 1.6 (L)  1.7 - 2.4 mg/dL Final   Performed at [facility], [address], [place], [place] [address]   Alcohol, Ethyl (B) 07/29/2021 277 (H)  <10 mg/dL Final   Comment: (NOTE) Lowest detectable limit for serum alcohol is 10 mg/dL.  For medical purposes only. Performed at [facility], [address], [place], [place] [address]    Cholesterol 07/29/2021 259 (H)  0 - 200 mg/dL Final   Triglycerides 67/67/2094 62  <150 mg/dL Final   HDL 70/96/2836 >135  >40 mg/dL Final   Total CHOL/HDL Ratio 07/29/2021 NOT CALCULATED  RATIO Final   VLDL 07/29/2021 12  0 - 40 mg/dL Final   LDL Cholesterol 07/29/2021 NOT CALCULATED  0 - 99 mg/dL Final   Performed at [facility], [address]  [address], [place], [place] [address]   TSH 07/29/2021 0.545  0.350 - 4.500 uIU/mL Final   Comment: Performed by a 3rd Generation assay with a functional sensitivity of <=0.01 uIU/mL. Performed at [facility], [place] [address], [place], [place] [address]    Color, Urine 07/29/2021 YELLOW  YELLOW Final   APPearance 07/29/2021 CLEAR  CLEAR Final   Specific Gravity, Urine 07/29/2021 1.005  1.005 - 1.030 Final   pH 07/29/2021 5.0  5.0 - 8.0 Final   Glucose, UA 07/29/2021 NEGATIVE  NEGATIVE mg/dL Final   Hgb urine dipstick 07/29/2021 NEGATIVE  NEGATIVE Final   Bilirubin Urine 07/29/2021 NEGATIVE  NEGATIVE Final   Ketones, ur 07/29/2021 NEGATIVE  NEGATIVE mg/dL Final   Protein, ur 07/29/2021 NEGATIVE  NEGATIVE mg/dL Final   Nitrite 07/29/2021 NEGATIVE  NEGATIVE Final   Leukocytes,Ua 07/29/2021 NEGATIVE  NEGATIVE Final   WBC, UA 07/29/2021 0-5  0 - 5 WBC/hpf Final   Bacteria, UA 07/29/2021 NONE SEEN  NONE SEEN Final   Performed at [facility], [place] [address] [place], [place] [address]   POC  Amphetamine UR 07/29/2021 None Detected  NONE DETECTED (Cut Off Level 1000 ng/mL) Final   POC Secobarbital (BAR) 07/29/2021 None Detected  NONE DETECTED (Cut Off Level 300 ng/mL) Final   POC Buprenorphine (BUP) 07/29/2021 None Detected  NONE DETECTED (Cut Off Level 10 ng/mL) Final   POC Oxazepam (BZO) 07/29/2021 None Detected  NONE DETECTED (Cut Off Level 300 ng/mL) Final   POC Cocaine UR 07/29/2021 None Detected  NONE DETECTED (Cut Off Level 300 ng/mL) Final   POC Methamphetamine UR 07/29/2021 None Detected  NONE DETECTED (Cut Off Level 1000 ng/mL) Final   POC Morphine 07/29/2021 None Detected  NONE DETECTED (Cut Off Level 300 ng/mL) Final   POC Oxycodone UR 07/29/2021 None Detected  NONE DETECTED (Cut Off Level 100 ng/mL) Final   POC Methadone UR 07/29/2021 None Detected  NONE DETECTED (Cut Off Level 300 ng/mL) Final   POC Marijuana UR 07/29/2021 Positive (A)  NONE DETECTED (Cut Off Level 50 ng/mL) Final   SARS Coronavirus 2 Ag 07/29/2021 Negative  Negative Final   RPR Ser Ql 07/29/2021 NON REACTIVE  NON REACTIVE Final   Performed at [facility], [place] [address], [place], [place] [address]   HIV Screen 4th Generation wRfx 07/29/2021 Non Reactive  Non Reactive Final   Performed at [facility], [place] [address], [place], [place] [address]   SARSCOV2ONAVIRUS 2 AG 07/29/2021 NEGATIVE  NEGATIVE Final   Comment: (NOTE) SARS-CoV-2 antigen NOT DETECTED.   Negative results are presumptive.  Negative results do not preclude SARS-CoV-2 infection and should not be used as the sole basis for treatment or other patient management decisions, including infection  control decisions, particularly in the presence of clinical signs and  symptoms consistent with COVID-19, or in those who have been in contact with the virus.  Negative results must be combined with clinical observations, patient history, and epidemiological information. The expected result is Negative.  Fact Sheet for Patients:  HandmadeRecipes.com.cy  Fact Sheet for Healthcare Providers: FuneralLife.at  This test is not yet approved or cleared by the [place] FDA and  has been authorized for detection and/or diagnosis of SARS-CoV-2 by FDA under an Emergency Use Authorization (EUA).  This EUA will remain in effect (meaning this test can be used) for the duration of  the COV  ID-19 declaration under Section 564(b)(1) of the Act, 21 U.S.C. section 360bbb-3(b)(1), unless the authorization is terminated or revoked sooner.    Admission on 07/06/2021, Discharged on 07/07/2021  Component Date Value Ref Range Status   Sodium 07/06/2021 140  135 - 145 mmol/L Final   Potassium 07/06/2021 4.1  3.5 - 5.1 mmol/L Final   Chloride 07/06/2021 106  98 - 111 mmol/L Final   CO2 07/06/2021 21 (L)  22 - 32 mmol/L Final   Glucose, Bld 07/06/2021 113 (H)  70 - 99 mg/dL Final   Glucose reference range applies only to samples taken after fasting for at least 8 hours.   BUN 07/06/2021 17  8 - 23 mg/dL Final   Creatinine, Ser 07/06/2021 1.07  0.61 - 1.24 mg/dL Final   Calcium 07/06/2021 9.0  8.9 - 10.3 mg/dL Final   Total Protein 07/06/2021 6.8  6.5 - 8.1 g/dL Final   Albumin 07/06/2021 4.0  3.5 - 5.0 g/dL Final   AST 07/06/2021 21  15 - 41 U/L Final   ALT 07/06/2021 20  0 - 44 U/L Final   Alkaline Phosphatase 07/06/2021 65  38 - 126 U/L Final   Total Bilirubin 07/06/2021 0.4  0.3 - 1.2 mg/dL Final   GFR, Estimated 07/06/2021 >60  >60 mL/min Final   Comment: (NOTE) Calculated using the CKD-EPI Creatinine Equation (2021)    Anion gap 07/06/2021 13  5 - 15 Final   Performed at [place] [address], [place], [place] [address]   Alcohol, Ethyl (B) 07/06/2021 283 (H)  <10 mg/dL Final   Comment: (NOTE) Lowest detectable limit for serum alcohol is 10 mg/dL.  For medical purposes only. Performed at [facility], [place] [address]  [place], [place] [id number]    Salicylate Lvl [id number] <7.0 (L)  7.0 - 30.0 mg/dL Final   Performed at [place] [address] [place], [place] [address]   Acetaminophen (Tylenol), Serum 07/06/2021 <10 (L)  10 - 30 ug/mL Final   Comment: (NOTE) Therapeutic concentrations vary significantly. A range of 10-30 ug/mL  may be an effective concentration for many patients. However, some  are best treated at concentrations outside of this range. Acetaminophen concentrations >150 ug/mL at 4 hours after ingestion  and >50 ug/mL at 12 hours after ingestion are often associated with  toxic reactions.  Performed at [facility], [place] [address] [place], [place] [address]    Opiates 07/06/2021 NONE DETECTED  NONE DETECTED Final   Cocaine 07/06/2021 NONE DETECTED  NONE DETECTED Final   Benzodiazepines 07/06/2021 NONE DETECTED  NONE DETECTED Final   Amphetamines 07/06/2021 NONE DETECTED  NONE DETECTED Final   Tetrahydrocannabinol 07/06/2021 POSITIVE (A)  NONE DETECTED Final   Barbiturates 07/06/2021 NONE DETECTED  NONE DETECTED Final   Comment: (NOTE) DRUG SCREEN FOR MEDICAL PURPOSES ONLY.  IF CONFIRMATION IS NEEDED FOR ANY PURPOSE, NOTIFY LAB WITHIN 5 DAYS.  LOWEST DETECTABLE LIMITS FOR URINE DRUG SCREEN Drug Class                     Cutoff (ng/mL) Amphetamine and metabolites    1000 Barbiturate and metabolites    200 Benzodiazepine                 [id number] Tricyclics and metabolites     300 Opiates and metabolites        300 Cocaine and metabolites        300  THC                            50 Performed at [facility], [place] [address], [place], [place] [address]    SARS Coronavirus 2 by RT PCR 07/06/2021 NEGATIVE  NEGATIVE Final   Comment: (NOTE) SARS-CoV-2 target nucleic acids are NOT DETECTED.  The SARS-CoV-2 RNA is generally detectable in upper respiratory specimens during the acute phase of infection. The lowest concentration of SARS-CoV-2 viral copies this assay  can detect is 138 copies/mL. A negative result does not preclude SARS-Cov-2 infection and should not be used as the sole basis for treatment or other patient management decisions. A negative result may occur with  improper specimen collection/handling, submission of specimen other than nasopharyngeal swab, presence of viral mutation(s) within the areas targeted by this assay, and inadequate number of viral copies(<138 copies/mL). A negative result must be combined with clinical observations, patient history, and epidemiological information. The expected result is Negative.  Fact Sheet for Patients:  EntrepreneurPulse.com.au  Fact Sheet for Healthcare Providers:  IncredibleEmployment.be  This test is no                          t yet approved or cleared by the [place] FDA and  has been authorized for detection and/or diagnosis of SARS-CoV-2 by FDA under an Emergency Use Authorization (EUA). This EUA will remain  in effect (meaning this test can be used) for the duration of the COVID-19 declaration under Section 564(b)(1) of the Act, 21 U.S.C.section 360bbb-3(b)(1), unless the authorization is terminated  or revoked sooner.       Influenza A by PCR 07/06/2021 NEGATIVE  NEGATIVE Final   Influenza B by PCR 07/06/2021 NEGATIVE  NEGATIVE Final   Comment: (NOTE) The Xpert Xpress SARS-CoV-2/FLU/RSV plus assay is intended as an aid in the diagnosis of influenza from Nasopharyngeal swab specimens and should not be used as a sole basis for treatment. Nasal washings and aspirates are unacceptable for Xpert Xpress SARS-CoV-2/FLU/RSV testing.  Fact Sheet for Patients: EntrepreneurPulse.com.au  Fact Sheet for Healthcare Providers: IncredibleEmployment.be  This test is not yet approved or cleared by the [place] FDA and has been authorized for detection and/or diagnosis of SARS-CoV-2 by FDA under an  Emergency Use Authorization (EUA). This EUA will remain in effect (meaning this test can be used) for the duration of the COVID-19 declaration under Section 564(b)(1) of the Act, 21 U.S.C. section 360bbb-3(b)(1), unless the authorization is terminated or revoked.  Performed at [facility], [place] [address], [place], [place] [address]    WBC 07/06/2021 5.7  4.0 - 10.5 K/uL Final   RBC 07/06/2021 5.37  4.22 - 5.81 MIL/uL Final   Hemoglobin 07/06/2021 15.7  13.0 - 17.0 g/dL Final   HCT 07/06/2021 48.5  39.0 - 52.0 % Final   MCV 07/06/2021 90.3  80.0 - 100.0 fL Final   MCH 07/06/2021 29.2  26.0 - 34.0 pg Final   MCHC 07/06/2021 32.4  30.0 - 36.0 g/dL Final   RDW 07/06/2021 14.5  11.5 - 15.5 % Final   Platelets 07/06/2021 307  150 - 400 K/uL Final   nRBC 07/06/2021 0.0  0.0 - 0.2 % Final   Neutrophils Relative % 07/06/2021 53  % Final   Neutro Abs 07/06/2021 3.1  1.7 - 7.7 K/uL Final   Lymphocytes Relative 07/06/2021 35  % Final  Lymphs Abs 07/06/2021 2.0  0.7 - 4.0 K/uL Final   Monocytes Relative 07/06/2021 7  % Final   Monocytes Absolute 07/06/2021 0.4  0.1 - 1.0 K/uL Final   Eosinophils Relative 07/06/2021 3  % Final   Eosinophils Absolute 07/06/2021 0.2  0.0 - 0.5 K/uL Final   Basophils Relative 07/06/2021 1  % Final   Basophils Absolute 07/06/2021 0.0  0.0 - 0.1 K/uL Final   Immature Granulocytes 07/06/2021 1  % Final   Abs Immature Granulocytes 07/06/2021 0.06  0.00 - 0.07 K/uL Final   Performed at [facility], [place] [address], [place], [place] [address]   Color, Urine 07/06/2021 STRAW (A)  YELLOW Final   APPearance 07/06/2021 CLEAR  CLEAR Final   Specific Gravity, Urine 07/06/2021 1.010  1.005 - 1.030 Final   pH 07/06/2021 5.0  5.0 - 8.0 Final   Glucose, UA 07/06/2021 NEGATIVE  NEGATIVE mg/dL Final   Hgb urine dipstick 07/06/2021 NEGATIVE  NEGATIVE Final   Bilirubin Urine 07/06/2021 NEGATIVE  NEGATIVE Final   Ketones, ur 07/06/2021 NEGATIVE  NEGATIVE mg/dL Final    Protein, ur 07/06/2021 NEGATIVE  NEGATIVE mg/dL Final   Nitrite 07/06/2021 NEGATIVE  NEGATIVE Final   Leukocytes,Ua 07/06/2021 NEGATIVE  NEGATIVE Final   Performed at [facility], [place] [address], [place], [place] [address]  Admission on 02/21/2021, Discharged on 02/22/2021  Component Date Value Ref Range Status   WBC 02/21/2021 6.5  4.0 - 10.5 K/uL Final   RBC 02/21/2021 4.61  4.22 - 5.81 MIL/uL Final   Hemoglobin 02/21/2021 13.3  13.0 - 17.0 g/dL Final   HCT 02/21/2021 40.4  39.0 - 52.0 % Final   MCV 02/21/2021 87.6  80.0 - 100.0 fL Final   MCH 02/21/2021 28.9  26.0 - 34.0 pg Final   MCHC 02/21/2021 32.9  30.0 - 36.0 g/dL Final   RDW 02/21/2021 12.7  11.5 - 15.5 % Final   Platelets 02/21/2021 605 (H)  150 - 400 K/uL Final   nRBC 02/21/2021 0.0  0.0 - 0.2 % Final   Neutrophils Relative % 02/21/2021 49  % Final   Neutro Abs 02/21/2021 3.2  1.7 - 7.7 K/uL Final   Lymphocytes Relative 02/21/2021 43  % Final   Lymphs Abs 02/21/2021 2.8  0.7 - 4.0 K/uL Final   Monocytes Relative 02/21/2021 6  % Final   Monocytes Absolute 02/21/2021 0.4  0.1 - 1.0 K/uL Final   Eosinophils Relative 02/21/2021 1  % Final   Eosinophils Absolute 02/21/2021 0.1  0.0 - 0.5 K/uL Final   Basophils Relative 02/21/2021 0  % Final   Basophils Absolute 02/21/2021 0.0  0.0 - 0.1 K/uL Final   Immature Granulocytes 02/21/2021 1  % Final   Abs Immature Granulocytes 02/21/2021 0.04  0.00 - 0.07 K/uL Final   Performed at [facility], [place] [address] [place], [place] [address]   Sodium 02/21/2021 140  135 - 145 mmol/L Final   Potassium 02/21/2021 3.2 (L)  3.5 - 5.1 mmol/L Final   Chloride 02/21/2021 105  98 - 111 mmol/L Final   CO2 02/21/2021 22  22 - 32 mmol/L Final   Glucose, Bld 02/21/2021 102 (H)  70 - 99 mg/dL Final   Glucose reference range applies only to samples taken after fasting for at least 8 hours.   BUN 02/21/2021 10  8 - 23 mg/dL Final   Creatinine, Ser 02/21/2021 0.80  0.61 -  1.24 mg/dL Final  Calcium 02/21/2021 9.5  8.9 - 10.3 mg/dL Final   GFR, Estimated 02/21/2021 >60  >60 mL/min Final   Comment: (NOTE) Calculated using the CKD-EPI Creatinine Equation (2021)    Anion gap 02/21/2021 13  5 - 15 Final   Performed at [facility], [address] [place], [place] [address]   Specimen Description 02/22/2021    Final                   Value:JOINT FLUID RIGHT ELBOW Performed at [facility], [address] [place], [place] [address]    Special Requests 02/22/2021    Final                   Value:Normal Performed at [facility], [address] [place], [place] [address]    Gram Stain 02/22/2021    Final                   Value:RARE WBC PRESENT,BOTH PMN AND MONONUCLEAR NO ORGANISMS SEEN    Culture 02/22/2021    Final                   Value:NO GROWTH 3 DAYS Performed at [facility], [address] [place], [place] [address]    Report Status 02/22/2021 02/25/2021 FINAL   Final   Color, Synovial 02/22/2021 RED (A)  YELLOW Final   Appearance-Synovial 02/22/2021 TURBID (A)  CLEAR Final   Crystals, Fluid 02/22/2021 EXTRACELLULAR MONOSODIUM URATE CRYSTALS   Final   WBC, Synovial 02/22/2021 7,070 (H)  0 - 200 /cu mm Final   Neutrophil, Synovial 02/22/2021 55 (H)  0 - 25 % Final   Lymphocytes-Synovial Fld 02/22/2021 6  0 - 20 % Final   Monocyte-Macrophage-Synovial Fluid 02/22/2021 39 (L)  50 - 90 % Final   Eosinophils-Synovial 02/22/2021 0  0 - 1 % Final   Other Cells-SYN 02/22/2021 OTHER CELLS IDENTIFIED AS SYNOVIAL LINING CELLS   Final   Performed at [facility], [address] [place], [place] [address]  Admission on 02/09/2021, Discharged on 02/10/2021  Component Date Value Ref Range Status   Lactic Acid, Venous 02/10/2021 1.1  0.5 - 1.9 mmol/L Final   Performed at [facility], [address] [place], [place] [address]   Lactic Acid, Venous  02/09/2021 0.8  0.5 - 1.9 mmol/L Final   Performed at [facility], [address] [place], [place] [address]   Sodium 02/09/2021 136  135 - 145 mmol/L Final   Potassium 02/09/2021 3.2 (L)  3.5 - 5.1 mmol/L Final   Chloride 02/09/2021 98  98 - 111 mmol/L Final   CO2 02/09/2021 27  22 - 32 mmol/L Final   Glucose, Bld 02/09/2021 103 (H)  70 - 99 mg/dL Final   Glucose reference range applies only to samples taken after fasting for at least 8 hours.   BUN 02/09/2021 12  8 - 23 mg/dL Final   Creatinine, Ser 02/09/2021 0.81  0.61 - 1.24 mg/dL Final   Calcium 96/29/528405/29/2022 9.1  8.9 - 10.3 mg/dL Final   Total Protein 13/24/401005/29/2022 7.3  6.5 - 8.1 g/dL Final   Albumin 27/25/366405/29/2022 3.4 (L)  3.5 - 5.0 g/dL Final   AST 40/34/742505/29/2022 13 (L)  15 - 41 U/L Final   ALT 02/09/2021 13  0 - 44 U/L Final   Alkaline Phosphatase 02/09/2021 60  38 - 126 U/L Final  Total Bilirubin 02/09/2021 1.1  0.3 - 1.2 mg/dL Final   GFR, Estimated 02/09/2021 >60  >60 mL/min Final   Comment: (NOTE) Calculated using the CKD-EPI Creatinine Equation (2021)    Anion gap 02/09/2021 11  5 - 15 Final   Performed at [facility], [place] [address] [place], [place] [address]   WBC 02/09/2021 12.8 (H)  4.0 - 10.5 K/uL Final   RBC 02/09/2021 4.31  4.22 - 5.81 MIL/uL Final   Hemoglobin 02/09/2021 12.7 (L)  13.0 - 17.0 g/dL Final   HCT 02/09/2021 37.8 (L)  39.0 - 52.0 % Final   MCV 02/09/2021 87.7  80.0 - 100.0 fL Final   MCH 02/09/2021 29.5  26.0 - 34.0 pg Final   MCHC 02/09/2021 33.6  30.0 - 36.0 g/dL Final   RDW 02/09/2021 12.3  11.5 - 15.5 % Final   Platelets 02/09/2021 368  150 - 400 K/uL Final   nRBC 02/09/2021 0.0  0.0 - 0.2 % Final   Neutrophils Relative % 02/09/2021 75  % Final   Neutro Abs 02/09/2021 9.6 (H)  1.7 - 7.7 K/uL Final   Lymphocytes Relative 02/09/2021 16  % Final   Lymphs Abs 02/09/2021 2.1  0.7 - 4.0 K/uL Final   Monocytes Relative 02/09/2021 8  % Final   Monocytes Absolute  02/09/2021 1.0  0.1 - 1.0 K/uL Final   Eosinophils Relative 02/09/2021 0  % Final   Eosinophils Absolute 02/09/2021 0.0  0.0 - 0.5 K/uL Final   Basophils Relative 02/09/2021 0  % Final   Basophils Absolute 02/09/2021 0.0  0.0 - 0.1 K/uL Final   Immature Granulocytes 02/09/2021 1  % Final   Abs Immature Granulocytes 02/09/2021 0.09 (H)  0.00 - 0.07 K/uL Final   Performed at [facility], [place] [address], [place], [place] [address]   Color, Urine 02/10/2021 YELLOW  YELLOW Final   APPearance 02/10/2021 CLEAR  CLEAR Final   Specific Gravity, Urine 02/10/2021 1.015  1.005 - 1.030 Final   pH 02/10/2021 6.0  5.0 - 8.0 Final   Glucose, UA 02/10/2021 NEGATIVE  NEGATIVE mg/dL Final   Hgb urine dipstick 02/10/2021 NEGATIVE  NEGATIVE Final   Bilirubin Urine 02/10/2021 NEGATIVE  NEGATIVE Final   Ketones, ur 02/10/2021 20 (A)  NEGATIVE mg/dL Final   Protein, ur 02/10/2021 NEGATIVE  NEGATIVE mg/dL Final   Nitrite 02/10/2021 NEGATIVE  NEGATIVE Final   Leukocytes,Ua 02/10/2021 NEGATIVE  NEGATIVE Final   Performed at [facility] [address] [place], [place] [address]   Specimen Description 02/09/2021    Final                   Value:BLOOD LEFT ANTECUBITAL Performed at [facility] [address], [place], [place] [address]    Special Requests 02/09/2021    Final                   Value:BOTTLES DRAWN AEROBIC AND ANAEROBIC Blood Culture adequate volume Performed at [facility] [address], [place], [place] [address]    Culture 02/09/2021    Final                   Value:NO GROWTH 5 DAYS Performed at [facility] [address], [place], [place] [address]    Report Status 02/09/2021 02/15/2021 FINAL   Final   Specimen Description 02/09/2021    Final  Value:BLOOD LEFT ANTECUBITAL Performed at [facility] [address] [place], [place] [address]    Special Requests  02/09/2021    Final                   Value:BOTTLES DRAWN AEROBIC AND ANAEROBIC Blood Culture adequate volume Performed at [facility] [address] [place], [place] [address]    Culture 02/09/2021    Final                   Value:NO GROWTH 5 DAYS Performed at [facility] [address] [place], [place] [address]    Report Status 02/09/2021 02/15/2021 FINAL   Final   Sed Rate 02/09/2021 89 (H)  0 - 16 mm/hr Final   Performed at [facility], [place] [address], [place], [place] [address]   CRP 02/09/2021 14.7 (H)  <1.0 mg/dL Final   Performed at [facility] [address], [place], [place] [address]    Blood Alcohol level:  Lab Results  Component Value Date   ETH 277 (H) 07/29/2021   ETH 283 (H) [id number]    Metabolic Disorder Labs: Lab Results  Component Value Date   HGBA1C 5.4 07/29/2021   MPG 108.28 07/29/2021   MPG 108.28 11/26/2020   No results found for: PROLACTIN Lab Results  Component Value Date   CHOL 259 (H) 07/29/2021   TRIG 62 07/29/2021   HDL >135 07/29/2021   CHOLHDL NOT CALCULATED 07/29/2021   VLDL 12 07/29/2021   LDLCALC NOT CALCULATED 07/29/2021    Therapeutic Lab Levels: No results found for: LITHIUM No results found for: VALPROATE No components found for:  CBMZ  Physical Findings   Flowsheet Row ED from 07/29/2021 in [facility] from 07/06/2021 in [facility] ED from 06/23/2021 in [place] [facility] DEPT  C-SSRS RISK CATEGORY Low Risk High Risk No Risk        Musculoskeletal  Strength & Muscle Tone: within normal limits Gait & Station: normal Patient leans: N/A  Psychiatric Specialty Exam  Presentation  General Appearance: Appropriate for Environment; Casual  Eye Contact:Good  Speech:Clear and Coherent; Normal Rate  Speech Volume:Normal  Handedness:Right   Mood and  Affect  Mood:Euthymic  Affect:Congruent   Thought Process  Thought Processes:Coherent  Descriptions of Associations:Intact  Orientation:Full (Time, Place and Person)  Thought Content:Logical  Diagnosis of Schizophrenia or Schizoaffective disorder in past: No  Duration of Psychotic Symptoms: Less than six months (Endorsed auditory and visual hallucinations)   Hallucinations:Hallucinations: None  Ideas of Reference:None  Suicidal Thoughts:Suicidal Thoughts: No SI Passive Intent and/or Plan: Without Intent; With Means to Carry Out; With Plan  Homicidal Thoughts:Homicidal Thoughts: No   Sensorium  Memory:Immediate Good; Recent Good; Remote Good  Judgment:Good  Insight:Good   Executive Functions  Concentration:Good  Attention Span:Good  [place] of Knowledge:Good  Language:Good   Psychomotor Activity  Psychomotor Activity:Psychomotor Activity: Normal   Assets  Assets:Communication Skills; Desire for Improvement; Financial Resources/Insurance; Housing; Physical Health; Resilience; Social Support; Vocational/Educational; Transportation   Sleep  Sleep:Sleep: Good Number of Hours of Sleep: 8   No data recorded  Physical Exam  Physical Exam Vitals and nursing note reviewed.  Constitutional:      General: He is not in acute distress.    Appearance: Normal appearance. He is well-developed.  HENT:     Head: Normocephalic and atraumatic.  Eyes:  General:        Right eye: No discharge.        Left eye: No discharge.     Conjunctiva/sclera: Conjunctivae normal.  Cardiovascular:     Rate and Rhythm: Normal rate.     Heart sounds: No murmur heard. Pulmonary:     Effort: Pulmonary effort is normal. No respiratory distress.  Musculoskeletal:        General: Normal range of motion.     Cervical back: Neck supple.  Skin:    Coloration: Skin is not jaundiced or pale.  Neurological:     Mental Status: He is alert and oriented to person,  place, and time.  Psychiatric:        Attention and Perception: Attention and perception normal.        Mood and Affect: Mood normal.        Speech: Speech normal.        Behavior: Behavior normal. Behavior is cooperative.        Thought Content: Thought content normal.        Cognition and Memory: Cognition normal.        Judgment: Judgment is impulsive.   Review of Systems  Constitutional: Negative.   HENT: Negative.    Eyes: Negative.   Respiratory: Negative.    Cardiovascular: Negative.   Musculoskeletal: Negative.   Skin: Negative.   Neurological: Negative.   Psychiatric/Behavioral: Negative.    Blood pressure 121/77, pulse 93, temperature 97.8 F (36.6 C), temperature source Temporal, resp. rate 18, SpO2 97 %. There is no height or weight on file to calculate BMI.  Treatment Plan Summary: Daily contact with patient to assess and evaluate symptoms and progress in treatment and Medication management  Disposition: Ongoing, Plan to discharge on Monday 08/04/2021 in the am. Patient has been accepted to [facility] for residential substance use treatment on  Monday, November 21.  14-day sample of medications have been ordered for: Amlodipine 5 mg daily, gabapentin 200 mg 3 times daily, Indocin 25 mg 3 times daily as needed, Zoloft 50 mg daily, trazodone 50 mg nightly as needed.  Printed prescriptions were also provided for medications including 1 month refill.   Revonda Humphrey, NP 08/03/2021 1:10 PM

## 2021-08-03 NOTE — Progress Notes (Signed)
During shift patient remains calm and pleasant, continues to deny SI/ HI and AVH ,CIWA score of 0. Patient medication compliant during shift, only having tylenol for a headache earlier in shift. Patient states his appetite is much better and he is feeling better than previous days. Patient is aware of plan for discharge tomorrow to [facility]. Nurse will report to oncoming shift.

## 2021-08-03 NOTE — Progress Notes (Signed)
Patient received Tylenol 650 mg due to headache pain 5/10.

## 2021-08-04 ENCOUNTER — Telehealth: Payer: Self-pay

## 2021-08-04 ENCOUNTER — Other Ambulatory Visit (HOSPITAL_COMMUNITY)
Admission: EM | Admit: 2021-08-04 | Discharge: 2021-08-06 | Disposition: A | Discharge status: Other Acute Inpatient Hospital | Payer: No Payment, Other | Source: Home / Self Care | Attending: Psychiatry | Admitting: Psychiatry

## 2021-08-04 ENCOUNTER — Encounter (HOSPITAL_COMMUNITY): Payer: Self-pay | Admitting: Registered Nurse

## 2021-08-04 DIAGNOSIS — Z20822 Contact with and (suspected) exposure to covid-19: Secondary | ICD-10-CM | POA: Insufficient documentation

## 2021-08-04 DIAGNOSIS — R45851 Suicidal ideations: Secondary | ICD-10-CM | POA: Insufficient documentation

## 2021-08-04 DIAGNOSIS — Z79899 Other long term (current) drug therapy: Secondary | ICD-10-CM | POA: Insufficient documentation

## 2021-08-04 DIAGNOSIS — F332 Major depressive disorder, recurrent severe without psychotic features: Secondary | ICD-10-CM | POA: Insufficient documentation

## 2021-08-04 DIAGNOSIS — F1994 Other psychoactive substance use, unspecified with psychoactive substance-induced mood disorder: Secondary | ICD-10-CM

## 2021-08-04 DIAGNOSIS — F4323 Adjustment disorder with mixed anxiety and depressed mood: Secondary | ICD-10-CM | POA: Diagnosis present

## 2021-08-04 DIAGNOSIS — F1910 Other psychoactive substance abuse, uncomplicated: Secondary | ICD-10-CM | POA: Diagnosis present

## 2021-08-04 DIAGNOSIS — I10 Essential (primary) hypertension: Secondary | ICD-10-CM | POA: Insufficient documentation

## 2021-08-04 LAB — RESP PANEL BY RT-PCR (FLU A&B, COVID) ARPGX2
Influenza A by PCR: NEGATIVE
Influenza B by PCR: NEGATIVE
SARS Coronavirus 2 by RT PCR: NEGATIVE

## 2021-08-04 LAB — POCT URINE DRUG SCREEN - MANUAL ENTRY (I-SCREEN)
POC Amphetamine UR: NOT DETECTED
POC Buprenorphine (BUP): NOT DETECTED
POC Cocaine UR: NOT DETECTED
POC Marijuana UR: POSITIVE — AB
POC Methadone UR: NOT DETECTED
POC Methamphetamine UR: NOT DETECTED
POC Morphine: NOT DETECTED
POC Oxazepam (BZO): NOT DETECTED
POC Oxycodone UR: NOT DETECTED
POC Secobarbital (BAR): NOT DETECTED

## 2021-08-04 LAB — ETHANOL: Alcohol, Ethyl (B): <10 mg/dL (ref <10)

## 2021-08-04 LAB — POC SARS CORONAVIRUS 2 AG -  ED: SARS Coronavirus 2 Ag: NEGATIVE

## 2021-08-04 LAB — POC SARS CORONAVIRUS 2 AG: SARSCOV2ONAVIRUS 2 AG: NEGATIVE

## 2021-08-04 MED ORDER — ACETAMINOPHEN 325 MG PO TABS
650.0000 mg | ORAL_TABLET | Freq: Four times a day (QID) | ORAL | Status: DC | PRN
  Administered 2021-08-04: 650 mg via ORAL
  Filled 2021-08-04: qty 2

## 2021-08-04 MED ORDER — AMLODIPINE BESYLATE 5 MG PO TABS
5.0000 mg | ORAL_TABLET | Freq: Every day | ORAL | Status: DC
  Administered 2021-08-04 – 2021-08-06 (×3): 5 mg via ORAL
  Filled 2021-08-04 (×3): qty 1

## 2021-08-04 MED ORDER — SERTRALINE HCL 50 MG PO TABS
50.0000 mg | ORAL_TABLET | Freq: Every day | ORAL | Status: DC
  Administered 2021-08-04 – 2021-08-06 (×3): 50 mg via ORAL
  Filled 2021-08-04 (×3): qty 1

## 2021-08-04 MED ORDER — INDOMETHACIN 25 MG PO CAPS
25.0000 mg | ORAL_CAPSULE | Freq: Three times a day (TID) | ORAL | Status: DC | PRN
  Administered 2021-08-06: 25 mg via ORAL
  Filled 2021-08-04: qty 1

## 2021-08-04 MED ORDER — ALUM & MAG HYDROXIDE-SIMETH 200-200-20 MG/5ML PO SUSP: 30.0000 mL | ORAL | Status: DC | PRN

## 2021-08-04 MED ORDER — TRAZODONE HCL 50 MG PO TABS
50.0000 mg | ORAL_TABLET | Freq: Every evening | ORAL | Status: DC | PRN
  Administered 2021-08-04 – 2021-08-05 (×2): 50 mg via ORAL
  Filled 2021-08-04 (×2): qty 1

## 2021-08-04 MED ORDER — MAGNESIUM HYDROXIDE 400 MG/5ML PO SUSP: 30.0000 mL | Freq: Every day | ORAL | Status: DC | PRN

## 2021-08-04 MED ORDER — GABAPENTIN 100 MG PO CAPS
200.0000 mg | ORAL_CAPSULE | Freq: Three times a day (TID) | ORAL | Status: DC
  Administered 2021-08-04 – 2021-08-06 (×6): 200 mg via ORAL
  Filled 2021-08-04 (×6): qty 2

## 2021-08-04 NOTE — ED Notes (Signed)
Pt asleep in bed. Respirations even and unlabored. Will continue to monitor for safety. ?

## 2021-08-04 NOTE — ED Provider Notes (Signed)
Behavioral Health Admission [facility] & OBS)  Date: 08/04/21 Patient Name: Sivan Payamps MRN: [id number] Chief Complaint:  Chief Complaint  Patient presents with   Alcohol Problem      Diagnoses:  Final diagnoses:  None    HPI: Sarp Diaz-Rivera, 60 y.o., male patient with history of alcohol use disorder and major depressive disorder was discharged from [facility] facility base crisis unit this morning and sent to [facility]  Once patient was at facility he was turned away stating he had out of county [place].    Patient seen face to face by this provider, consulted with Dr. Ernie Hew; and chart reviewed on 08/04/21.  On evaluation Dolph Diaz-Rivera reports he was turned away from [facility] because of Medicaid and sent back "here."   Patient reports that he has [facility] and show his Medicaid card that has an [place] address but he was still turned away and provided transportation back to [facility].  Patient states he began to feel bad and have bad thoughts "I was really happy this morning because I was going to get treatment but every time something like this happens and I started to have the bad thoughts.  I don't think I was going to do anything to kill myself but I was having the thoughts.  I just want to get help."  Patient states he lives with his daughter who is supportive and has outpatient psychiatric services.   During evaluation Tyric Diaz-Rivera is alert/oriented x 4; calm/cooperative; and mood is congruent with affect.  He does not appear to be responding to internal/external stimuli or delusional thoughts.  Patient denies suicidal/self-harm/homicidal ideation, psychosis, and paranoia.    Spoke with Radonna Ricker social work/counselor who reports that patient was accepted to [facility] on Friday and was told he could come Monday morning; states she is unsure what has happened but has attempted to contact intake at [facility].   Suggested to give patient other resources for rehab services so that patient could start with telephone interviews.         PHQ 2-9:   [place] ED from 07/29/2021 in [facility] ED from 07/06/2021 in [place] ED from 06/23/2021 in [facility] DEPT  C-SSRS RISK CATEGORY Low Risk High Risk No Risk        Total Time spent with patient: 30 minutes  Musculoskeletal  Strength & Muscle Tone: within normal limits Gait & Station: normal Patient leans: N/A  Psychiatric Specialty Exam  Presentation General Appearance: Appropriate for Environment; Casual  Eye Contact:Good  Speech:Clear and Coherent; Normal Rate  Speech Volume:Normal  Handedness:Right   Mood and Affect  Mood:Anxious; Dysphoric  Affect:Congruent   Thought Process  Thought Processes:Coherent; Goal Directed  Descriptions of Associations:Intact  Orientation:Full (Time, Place and Person)  Thought Content:Logical; WDL  Diagnosis of Schizophrenia or Schizoaffective disorder in past: No   Hallucinations:Hallucinations: None  Ideas of Reference:None  Suicidal Thoughts:Suicidal Thoughts: No  Homicidal Thoughts:Homicidal Thoughts: No   Sensorium  Memory:Immediate Good; Recent Good; Remote Good  Judgment:Intact  Insight:Present   Executive Functions  Concentration:Good  Attention Span:Good  [place] of Knowledge:Good  Language:Good   Psychomotor Activity  Psychomotor Activity:Psychomotor Activity: Normal   Assets  Assets:Communication Skills; Desire for Improvement; Financial Resources/Insurance; Housing; Resilience; Social Support   Sleep  Sleep:Sleep: Good Number of Hours of Sleep: 8   Nutritional Assessment (For OBS and FBC admissions only) Has  the patient had a weight loss or gain of 10 pounds or more in the last 3 months?: No Has the patient had a decrease in food  intake/or appetite?: No Does the patient have dental problems?: No Does the patient have eating habits or behaviors that may be indicators of an eating disorder including binging or inducing vomiting?: No Has the patient recently lost weight without trying?: 2 Has the patient been eating poorly because of a decreased appetite?: 0 Malnutrition Screening Tool Score: 2 Nutritional Assessment Referrals: Other (comment) (Treatement for alcohol use disorder.  There has been improvement with eating)    Physical Exam Vitals and nursing note reviewed. Exam conducted with a chaperone present.  Constitutional:      General: He is not in acute distress.    Appearance: Normal appearance. He is not ill-appearing.  Cardiovascular:     Rate and Rhythm: Normal rate.  Pulmonary:     Effort: Pulmonary effort is normal.  Musculoskeletal:        General: Normal range of motion.     Cervical back: Normal range of motion.  Skin:    General: Skin is warm and dry.  Neurological:     Mental Status: He is alert and oriented to person, place, and time.  Psychiatric:        Attention and Perception: Attention and perception normal. He does not perceive auditory or visual hallucinations.        Mood and Affect: Affect normal. Mood is anxious.        Speech: Speech normal.        Behavior: Behavior normal. Behavior is cooperative.        Thought Content: Thought content is not paranoid or delusional. Thought content does not include homicidal ideation. Suicidal: Reporting passive suicidal thoughts related to being sent away from [facility].  No plan or intent.Thought content does not include suicidal plan.        Cognition and Memory: Cognition and memory normal.        Judgment: Judgment normal.   Review of Systems  Constitutional: Negative.   HENT: Negative.    Eyes: Negative.   Respiratory: Negative.    Cardiovascular: Negative.   Gastrointestinal: Negative.   Genitourinary: Negative.   Musculoskeletal:  Negative.   Skin: Negative.   Neurological: Negative.   Endo/Heme/Allergies: Negative.   Psychiatric/Behavioral:  Positive for substance abuse (Alcohol). Depression: Stable. Hallucinations: Denies. Suicidal ideas: Passive thoughts no plan or intent.Nervous/anxious: Stable.    There were no vitals taken for this visit. There is no height or weight on file to calculate BMI.  Past Psychiatric History: Major depressive disorder major and alcohol use disorder  Is the patient at risk to self?  Denies at this time but endorses passive suicidal thoughts Has the patient been a risk to self in the past 6 months? No .    Has the patient been a risk to self within the distant past? No   Is the patient a risk to others? No   Has the patient been a risk to others in the past 6 months? No   Has the patient been a risk to others within the distant past? No   Past Medical History:  Past Medical History:  Diagnosis Date   Hypertension     Past Surgical History:  Procedure Laterality Date   IRRIGATION AND DEBRIDEMENT FOOT Left 11/25/2020   Procedure: IRRIGATION AND DEBRIDEMENT FOOT;  Surgeon: Felipa Furnace, DPM;  Location: [facility] ORS;  Service:  Podiatry;  Laterality: Left;    Family History: History reviewed. No pertinent family history.  Social History:  Social History   Socioeconomic History   Marital status: Single    Spouse name: Not on file   Number of children: Not on file   Years of education: Not on file   Highest education level: Not on file  Occupational History   Not on file  Tobacco Use   Smoking status: Never   Smokeless tobacco: Never  Vaping Use   Vaping Use: Never used  Substance and Sexual Activity   Alcohol use: Yes    Comment: weekends   Drug use: Never   Sexual activity: Yes    Partners: Female  Other Topics Concern   Not on file  Social History Narrative   Not on file   Social Determinants of Health   Financial Resource Strain: Not on file  Food Insecurity:  Not on file  Transportation Needs: Not on file  Physical Activity: Not on file  Stress: Not on file  Social Connections: Not on file  Intimate Partner Violence: Not on file    SDOH:  SDOH Screenings   Alcohol Screen: Not on file  Depression (PHQ2-9): Not on file  Financial Resource Strain: Not on file  Food Insecurity: Not on file  Housing: Not on file  Physical Activity: Not on file  Social Connections: Not on file  Stress: Not on file  Tobacco Use: Low Risk    Smoking Tobacco Use: Never   Smokeless Tobacco Use: Never   Passive Exposure: Not on file  Transportation Needs: Not on file    Last Labs:  Admission on 07/29/2021, Discharged on 08/04/2021  Component Date Value Ref Range Status   SARS Coronavirus 2 by RT PCR 07/29/2021 NEGATIVE  NEGATIVE Final   Comment: (NOTE) SARS-CoV-2 target nucleic acids are NOT DETECTED.  The SARS-CoV-2 RNA is generally detectable in upper respiratory specimens during the acute phase of infection. The lowest concentration of SARS-CoV-2 viral copies this assay can detect is 138 copies/mL. A negative result does not preclude SARS-Cov-2 infection and should not be used as the sole basis for treatment or other patient management decisions. A negative result may occur with  improper specimen collection/handling, submission of specimen other than nasopharyngeal swab, presence of viral mutation(s) within the areas targeted by this assay, and inadequate number of viral copies(<138 copies/mL). A negative result must be combined with clinical observations, patient history, and epidemiological information. The expected result is Negative.  Fact Sheet for Patients:  EntrepreneurPulse.com.au  Fact Sheet for Healthcare Providers:  IncredibleEmployment.be  This test is no                          t yet approved or cleared by the [place] FDA and  has been authorized for detection and/or diagnosis of  SARS-CoV-2 by FDA under an Emergency Use Authorization (EUA). This EUA will remain  in effect (meaning this test can be used) for the duration of the COVID-19 declaration under Section 564(b)(1) of the Act, 21 U.S.C.section 360bbb-3(b)(1), unless the authorization is terminated  or revoked sooner.       Influenza A by PCR 07/29/2021 NEGATIVE  NEGATIVE Final   Influenza B by PCR 07/29/2021 NEGATIVE  NEGATIVE Final   Comment: (NOTE) The Xpert Xpress SARS-CoV-2/FLU/RSV plus assay is intended as an aid in the diagnosis of influenza from Nasopharyngeal swab specimens and should not be used as a sole  basis for treatment. Nasal washings and aspirates are unacceptable for Xpert Xpress SARS-CoV-2/FLU/RSV testing.  Fact Sheet for Patients: EntrepreneurPulse.com.au  Fact Sheet for Healthcare Providers: IncredibleEmployment.be  This test is not yet approved or cleared by the [place] FDA and has been authorized for detection and/or diagnosis of SARS-CoV-2 by FDA under an Emergency Use Authorization (EUA). This EUA will remain in effect (meaning this test can be used) for the duration of the COVID-19 declaration under Section 564(b)(1) of the Act, 21 U.S.C. section 360bbb-3(b)(1), unless the authorization is terminated or revoked.  Performed at [facility], [place] [address] [place], [place] [address]    WBC 07/29/2021 4.8  4.0 - 10.5 K/uL Final   RBC 07/29/2021 5.45  4.22 - 5.81 MIL/uL Final   Hemoglobin 07/29/2021 16.5  13.0 - 17.0 g/dL Final   HCT 07/29/2021 48.6  39.0 - 52.0 % Final   MCV 07/29/2021 89.2  80.0 - 100.0 fL Final   MCH 07/29/2021 30.3  26.0 - 34.0 pg Final   MCHC 07/29/2021 34.0  30.0 - 36.0 g/dL Final   RDW 07/29/2021 15.0  11.5 - 15.5 % Final   Platelets 07/29/2021 289  150 - 400 K/uL Final   nRBC 07/29/2021 0.0  0.0 - 0.2 % Final   Neutrophils Relative % 07/29/2021 52  % Final   Neutro Abs 07/29/2021 2.5  1.7 - 7.7  K/uL Final   Lymphocytes Relative 07/29/2021 40  % Final   Lymphs Abs 07/29/2021 2.0  0.7 - 4.0 K/uL Final   Monocytes Relative 07/29/2021 6  % Final   Monocytes Absolute 07/29/2021 0.3  0.1 - 1.0 K/uL Final   Eosinophils Relative 07/29/2021 2  % Final   Eosinophils Absolute 07/29/2021 0.1  0.0 - 0.5 K/uL Final   Basophils Relative 07/29/2021 0  % Final   Basophils Absolute 07/29/2021 0.0  0.0 - 0.1 K/uL Final   Immature Granulocytes 07/29/2021 0  % Final   Abs Immature Granulocytes 07/29/2021 0.02  0.00 - 0.07 K/uL Final   Performed at [facility], [place] [address], [place], [place] [address]   Sodium 07/29/2021 139  135 - 145 mmol/L Final   Potassium 07/29/2021 3.8  3.5 - 5.1 mmol/L Final   Chloride 07/29/2021 99  98 - 111 mmol/L Final   CO2 07/29/2021 25  22 - 32 mmol/L Final   Glucose, Bld 07/29/2021 74  70 - 99 mg/dL Final   Glucose reference range applies only to samples taken after fasting for at least 8 hours.   BUN 07/29/2021 8  8 - 23 mg/dL Final   Creatinine, Ser 07/29/2021 0.75  0.61 - 1.24 mg/dL Final   Calcium 07/29/2021 9.2  8.9 - 10.3 mg/dL Final   Total Protein 07/29/2021 7.1  6.5 - 8.1 g/dL Final   Albumin 07/29/2021 4.2  3.5 - 5.0 g/dL Final   AST 07/29/2021 55 (H)  15 - 41 U/L Final   ALT 07/29/2021 50 (H)  0 - 44 U/L Final   Alkaline Phosphatase 07/29/2021 61  38 - 126 U/L Final   Total Bilirubin 07/29/2021 1.1  0.3 - 1.2 mg/dL Final   GFR, Estimated 07/29/2021 >60  >60 mL/min Final   Comment: (NOTE) Calculated using the CKD-EPI Creatinine Equation (2021)    Anion gap 07/29/2021 15  5 - 15 Final   Performed at [facility] [address] [place], [place] [address]   Hgb A1c MFr Bld 07/29/2021 5.4  4.8 -  5.6 % Final   Comment: (NOTE) Pre diabetes:          5.7%-6.4%  Diabetes:              >6.4%  Glycemic control for   <7.0% adults with diabetes    Mean Plasma Glucose 07/29/2021 108.28  mg/dL Final   Performed at [facility],  [place] [address] [place], [place] [address]   Magnesium 07/29/2021 1.6 (L)  1.7 - 2.4 mg/dL Final   Performed at [facility] [address] [place], [place] [address]   Alcohol, Ethyl (B) 07/29/2021 277 (H)  <10 mg/dL Final   Comment: (NOTE) Lowest detectable limit for serum alcohol is 10 mg/dL.  For medical purposes only. Performed at [facility], [place] [address], [place], [place] [address]    Cholesterol 07/29/2021 259 (H)  0 - 200 mg/dL Final   Triglycerides 07/29/2021 62  <150 mg/dL Final   HDL 07/29/2021 >135  >40 mg/dL Final   Total CHOL/HDL Ratio 07/29/2021 NOT CALCULATED  RATIO Final   VLDL 07/29/2021 12  0 - 40 mg/dL Final   LDL Cholesterol 07/29/2021 NOT CALCULATED  0 - 99 mg/dL Final   Performed at [facility], [place] [address], [place], [place] [address]   TSH 07/29/2021 0.545  0.350 - 4.500 uIU/mL Final   Comment: Performed by a 3rd Generation assay with a functional sensitivity of <=0.01 uIU/mL. Performed at [facility], [place] [address], [place], [place] [address]    Color, Urine 07/29/2021 YELLOW  YELLOW Final   APPearance 07/29/2021 CLEAR  CLEAR Final   Specific Gravity, Urine 07/29/2021 1.005  1.005 - 1.030 Final   pH 07/29/2021 5.0  5.0 - 8.0 Final   Glucose, UA 07/29/2021 NEGATIVE  NEGATIVE mg/dL Final   Hgb urine dipstick 07/29/2021 NEGATIVE  NEGATIVE Final   Bilirubin Urine 07/29/2021 NEGATIVE  NEGATIVE Final   Ketones, ur 07/29/2021 NEGATIVE  NEGATIVE mg/dL Final   Protein, ur 07/29/2021 NEGATIVE  NEGATIVE mg/dL Final   Nitrite 07/29/2021 NEGATIVE  NEGATIVE Final   Leukocytes,Ua 07/29/2021 NEGATIVE  NEGATIVE Final   WBC, UA 07/29/2021 0-5  0 - 5 WBC/hpf Final   Bacteria, UA 07/29/2021 NONE SEEN  NONE SEEN Final   Performed at [facility], [place] [address], [place], [place] [address]   POC Amphetamine UR 07/29/2021 None Detected  NONE DETECTED (Cut Off Level 1000 ng/mL) Final   POC Secobarbital (BAR) 07/29/2021 None Detected  NONE  DETECTED (Cut Off Level 300 ng/mL) Final   POC Buprenorphine (BUP) 07/29/2021 None Detected  NONE DETECTED (Cut Off Level 10 ng/mL) Final   POC Oxazepam (BZO) 07/29/2021 None Detected  NONE DETECTED (Cut Off Level 300 ng/mL) Final   POC Cocaine UR 07/29/2021 None Detected  NONE DETECTED (Cut Off Level 300 ng/mL) Final   POC Methamphetamine UR 07/29/2021 None Detected  NONE DETECTED (Cut Off Level 1000 ng/mL) Final   POC Morphine 07/29/2021 None Detected  NONE DETECTED (Cut Off Level 300 ng/mL) Final   POC Oxycodone UR 07/29/2021 None Detected  NONE DETECTED (Cut Off Level 100 ng/mL) Final   POC Methadone UR 07/29/2021 None Detected  NONE DETECTED (Cut Off Level 300 ng/mL) Final   POC Marijuana UR 07/29/2021 Positive (A)  NONE DETECTED (Cut Off Level 50 ng/mL) Final   SARS Coronavirus 2 Ag 07/29/2021 Negative  Negative Final   RPR Ser Ql 07/29/2021 NON REACTIVE  NON REACTIVE Final  Performed at [facility], [place] [address], [place], [place] [address]   HIV Screen 4th Generation wRfx 07/29/2021 Non Reactive  Non Reactive Final   Performed at [facility], [place] [address], [place], [place] [address]   SARSCOV2ONAVIRUS 2 AG 07/29/2021 NEGATIVE  NEGATIVE Final   Comment: (NOTE) SARS-CoV-2 antigen NOT DETECTED.   Negative results are presumptive.  Negative results do not preclude SARS-CoV-2 infection and should not be used as the sole basis for treatment or other patient management decisions, including infection  control decisions, particularly in the presence of clinical signs and  symptoms consistent with COVID-19, or in those who have been in contact with the virus.  Negative results must be combined with clinical observations, patient history, and epidemiological information. The expected result is Negative.  Fact Sheet for Patients: HandmadeRecipes.com.cy  Fact Sheet for Healthcare Providers: FuneralLife.at  This test is not yet  approved or cleared by the [place] FDA and  has been authorized for detection and/or diagnosis of SARS-CoV-2 by FDA under an Emergency Use Authorization (EUA).  This EUA will remain in effect (meaning this test can be used) for the duration of  the COV                          ID-19 declaration under Section 564(b)(1) of the Act, 21 U.S.C. section 360bbb-3(b)(1), unless the authorization is terminated or revoked sooner.    Admission on 07/06/2021, Discharged on 07/07/2021  Component Date Value Ref Range Status   Sodium 07/06/2021 140  135 - 145 mmol/L Final   Potassium 07/06/2021 4.1  3.5 - 5.1 mmol/L Final   Chloride 07/06/2021 106  98 - 111 mmol/L Final   CO2 07/06/2021 21 (L)  22 - 32 mmol/L Final   Glucose, Bld 07/06/2021 113 (H)  70 - 99 mg/dL Final   Glucose reference range applies only to samples taken after fasting for at least 8 hours.   BUN 07/06/2021 17  8 - 23 mg/dL Final   Creatinine, Ser 07/06/2021 1.07  0.61 - 1.24 mg/dL Final   Calcium 07/06/2021 9.0  8.9 - 10.3 mg/dL Final   Total Protein 07/06/2021 6.8  6.5 - 8.1 g/dL Final   Albumin 07/06/2021 4.0  3.5 - 5.0 g/dL Final   AST 07/06/2021 21  15 - 41 U/L Final   ALT 07/06/2021 20  0 - 44 U/L Final   Alkaline Phosphatase 07/06/2021 65  38 - 126 U/L Final   Total Bilirubin 07/06/2021 0.4  0.3 - 1.2 mg/dL Final   GFR, Estimated 07/06/2021 >60  >60 mL/min Final   Comment: (NOTE) Calculated using the CKD-EPI Creatinine Equation (2021)    Anion gap 07/06/2021 13  5 - 15 Final   Performed at [facility] [address] [place], [place] [address]   Alcohol, Ethyl (B) 07/06/2021 283 (H)  <10 mg/dL Final   Comment: (NOTE) Lowest detectable limit for serum alcohol is 10 mg/dL.  For medical purposes only. Performed at [facility], [place] [address] [place], [place] [id number]    Salicylate Lvl [id number] <7.0 (L)  7.0 - 30.0 mg/dL Final   Performed at [facility] [address]  [place], [place] [address]   Acetaminophen (Tylenol), Serum 07/06/2021 <10 (L)  10 - 30 ug/mL Final   Comment: (NOTE) Therapeutic concentrations vary significantly. A range of 10-30 ug/mL  may be an effective concentration for many  patients. However, some  are best treated at concentrations outside of this range. Acetaminophen concentrations >150 ug/mL at 4 hours after ingestion  and >50 ug/mL at 12 hours after ingestion are often associated with  toxic reactions.  Performed at [facility], [address] [place], [place] [address]    Opiates 07/06/2021 NONE DETECTED  NONE DETECTED Final   Cocaine 07/06/2021 NONE DETECTED  NONE DETECTED Final   Benzodiazepines 07/06/2021 NONE DETECTED  NONE DETECTED Final   Amphetamines 07/06/2021 NONE DETECTED  NONE DETECTED Final   Tetrahydrocannabinol 07/06/2021 POSITIVE (A)  NONE DETECTED Final   Barbiturates 07/06/2021 NONE DETECTED  NONE DETECTED Final   Comment: (NOTE) DRUG SCREEN FOR MEDICAL PURPOSES ONLY.  IF CONFIRMATION IS NEEDED FOR ANY PURPOSE, NOTIFY LAB WITHIN 5 DAYS.  LOWEST DETECTABLE LIMITS FOR URINE DRUG SCREEN Drug Class                     Cutoff (ng/mL) Amphetamine and metabolites    1000 Barbiturate and metabolites    200 Benzodiazepine                 200 Tricyclics and metabolites     300 Opiates and metabolites        300 Cocaine and metabolites        300 THC                            50 Performed at [facility], [address] [place], [place] [address]    SARS Coronavirus 2 by RT PCR 07/06/2021 NEGATIVE  NEGATIVE Final   Comment: (NOTE) SARS-CoV-2 target nucleic acids are NOT DETECTED.  The SARS-CoV-2 RNA is generally detectable in upper respiratory specimens during the acute phase of infection. The lowest concentration of SARS-CoV-2 viral copies this assay can detect is 138 copies/mL. A negative result does not preclude SARS-Cov-2 infection and should not be used as the sole basis for treatment  or other patient management decisions. A negative result may occur with  improper specimen collection/handling, submission of specimen other than nasopharyngeal swab, presence of viral mutation(s) within the areas targeted by this assay, and inadequate number of viral copies(<138 copies/mL). A negative result must be combined with clinical observations, patient history, and epidemiological information. The expected result is Negative.  Fact Sheet for Patients:  BloggerCourse.com  Fact Sheet for Healthcare Providers:  SeriousBroker.it  This test is no                          t yet approved or cleared by the [place] FDA and  has been authorized for detection and/or diagnosis of SARS-CoV-2 by FDA under an Emergency Use Authorization (EUA). This EUA will remain  in effect (meaning this test can be used) for the duration of the COVID-19 declaration under Section 564(b)(1) of the Act, 21 U.S.C.section 360bbb-3(b)(1), unless the authorization is terminated  or revoked sooner.       Influenza A by PCR 07/06/2021 NEGATIVE  NEGATIVE Final   Influenza B by PCR 07/06/2021 NEGATIVE  NEGATIVE Final   Comment: (NOTE) The Xpert Xpress SARS-CoV-2/FLU/RSV plus assay is intended as an aid in the diagnosis of influenza from Nasopharyngeal swab specimens and should not be used as a sole basis for treatment. Nasal washings and aspirates are unacceptable for Xpert Xpress SARS-CoV-2/FLU/RSV testing.  Fact Sheet for Patients: BloggerCourse.com  Fact Sheet  for Healthcare Providers: IncredibleEmployment.be  This test is not yet approved or cleared by the [place] and has been authorized for detection and/or diagnosis of SARS-CoV-2 by FDA under an Emergency Use Authorization (EUA). This EUA will remain in effect (meaning this test can be used) for the duration of the COVID-19 declaration under  Section 564(b)(1) of the Act, 21 U.S.C. section 360bbb-3(b)(1), unless the authorization is terminated or revoked.  Performed at [facility], [place] [address], [place], [place] [address]    WBC 07/06/2021 5.7  4.0 - 10.5 K/uL Final   RBC 07/06/2021 5.37  4.22 - 5.81 MIL/uL Final   Hemoglobin 07/06/2021 15.7  13.0 - 17.0 g/dL Final   HCT 07/06/2021 48.5  39.0 - 52.0 % Final   MCV 07/06/2021 90.3  80.0 - 100.0 fL Final   MCH 07/06/2021 29.2  26.0 - 34.0 pg Final   MCHC 07/06/2021 32.4  30.0 - 36.0 g/dL Final   RDW 07/06/2021 14.5  11.5 - 15.5 % Final   Platelets 07/06/2021 307  150 - 400 K/uL Final   nRBC 07/06/2021 0.0  0.0 - 0.2 % Final   Neutrophils Relative % 07/06/2021 53  % Final   Neutro Abs 07/06/2021 3.1  1.7 - 7.7 K/uL Final   Lymphocytes Relative 07/06/2021 35  % Final   Lymphs Abs 07/06/2021 2.0  0.7 - 4.0 K/uL Final   Monocytes Relative 07/06/2021 7  % Final   Monocytes Absolute 07/06/2021 0.4  0.1 - 1.0 K/uL Final   Eosinophils Relative 07/06/2021 3  % Final   Eosinophils Absolute 07/06/2021 0.2  0.0 - 0.5 K/uL Final   Basophils Relative 07/06/2021 1  % Final   Basophils Absolute 07/06/2021 0.0  0.0 - 0.1 K/uL Final   Immature Granulocytes 07/06/2021 1  % Final   Abs Immature Granulocytes 07/06/2021 0.06  0.00 - 0.07 K/uL Final   Performed at [facility], [place] [address], [place], [place] [address]   Color, Urine 07/06/2021 STRAW (A)  YELLOW Final   APPearance 07/06/2021 CLEAR  CLEAR Final   Specific Gravity, Urine 07/06/2021 1.010  1.005 - 1.030 Final   pH 07/06/2021 5.0  5.0 - 8.0 Final   Glucose, UA 07/06/2021 NEGATIVE  NEGATIVE mg/dL Final   Hgb urine dipstick 07/06/2021 NEGATIVE  NEGATIVE Final   Bilirubin Urine 07/06/2021 NEGATIVE  NEGATIVE Final   Ketones, ur 07/06/2021 NEGATIVE  NEGATIVE mg/dL Final   Protein, ur 07/06/2021 NEGATIVE  NEGATIVE mg/dL Final   Nitrite 07/06/2021 NEGATIVE  NEGATIVE Final   Leukocytes,Ua 07/06/2021 NEGATIVE  NEGATIVE  Final   Performed at [facility], [place] [address] [place], [place] [address]  Admission on 02/21/2021, Discharged on 02/22/2021  Component Date Value Ref Range Status   WBC 02/21/2021 6.5  4.0 - 10.5 K/uL Final   RBC 02/21/2021 4.61  4.22 - 5.81 MIL/uL Final   Hemoglobin 02/21/2021 13.3  13.0 - 17.0 g/dL Final   HCT 02/21/2021 40.4  39.0 - 52.0 % Final   MCV 02/21/2021 87.6  80.0 - 100.0 fL Final   MCH 02/21/2021 28.9  26.0 - 34.0 pg Final   MCHC 02/21/2021 32.9  30.0 - 36.0 g/dL Final   RDW 02/21/2021 12.7  11.5 - 15.5 % Final   Platelets 02/21/2021 605 (H)  150 - 400 K/uL Final   nRBC 02/21/2021 0.0  0.0 - 0.2 % Final   Neutrophils Relative % 02/21/2021 49  % Final   Neutro  Abs 02/21/2021 3.2  1.7 - 7.7 K/uL Final   Lymphocytes Relative 02/21/2021 43  % Final   Lymphs Abs 02/21/2021 2.8  0.7 - 4.0 K/uL Final   Monocytes Relative 02/21/2021 6  % Final   Monocytes Absolute 02/21/2021 0.4  0.1 - 1.0 K/uL Final   Eosinophils Relative 02/21/2021 1  % Final   Eosinophils Absolute 02/21/2021 0.1  0.0 - 0.5 K/uL Final   Basophils Relative 02/21/2021 0  % Final   Basophils Absolute 02/21/2021 0.0  0.0 - 0.1 K/uL Final   Immature Granulocytes 02/21/2021 1  % Final   Abs Immature Granulocytes 02/21/2021 0.04  0.00 - 0.07 K/uL Final   Performed at [facility], [place] [address] [place], [place] [address]   Sodium 02/21/2021 140  135 - 145 mmol/L Final   Potassium 02/21/2021 3.2 (L)  3.5 - 5.1 mmol/L Final   Chloride 02/21/2021 105  98 - 111 mmol/L Final   CO2 02/21/2021 22  22 - 32 mmol/L Final   Glucose, Bld 02/21/2021 102 (H)  70 - 99 mg/dL Final   Glucose reference range applies only to samples taken after fasting for at least 8 hours.   BUN 02/21/2021 10  8 - 23 mg/dL Final   Creatinine, Ser 02/21/2021 0.80  0.61 - 1.24 mg/dL Final   Calcium 02/21/2021 9.5  8.9 - 10.3 mg/dL Final   GFR, Estimated 02/21/2021 >60  >60 mL/min Final   Comment: (NOTE) Calculated  using the CKD-EPI Creatinine Equation (2021)    Anion gap 02/21/2021 13  5 - 15 Final   Performed at [facility], [place] [address], [place], [place] [address]   Specimen Description 02/22/2021    Final                   Value:JOINT FLUID RIGHT ELBOW Performed at [facility], [place] [address] [place], [place] [address]    Special Requests 02/22/2021    Final                   Value:Normal Performed at [facility], [place] [address], [place], [place] [address]    Gram Stain 02/22/2021    Final                   Value:RARE WBC PRESENT,BOTH PMN AND MONONUCLEAR NO ORGANISMS SEEN    Culture 02/22/2021    Final                   Value:NO GROWTH 3 DAYS Performed at [facility], [place] [address] [place], [place] [address]    Report Status 02/22/2021 02/25/2021 FINAL   Final   Color, Synovial 02/22/2021 RED (A)  YELLOW Final   Appearance-Synovial 02/22/2021 TURBID (A)  CLEAR Final   Crystals, Fluid 02/22/2021 EXTRACELLULAR MONOSODIUM URATE CRYSTALS   Final   WBC, Synovial 02/22/2021 7,070 (H)  0 - 200 /cu mm Final   Neutrophil, Synovial 02/22/2021 55 (H)  0 - 25 % Final   Lymphocytes-Synovial Fld 02/22/2021 6  0 - 20 % Final   Monocyte-Macrophage-Synovial Fluid 02/22/2021 39 (L)  50 - 90 % Final   Eosinophils-Synovial 02/22/2021 0  0 - 1 % Final   Other Cells-SYN 02/22/2021 OTHER CELLS IDENTIFIED AS SYNOVIAL LINING CELLS   Final   Performed at [facility], [place] [address], [place], [place] [address]  Admission on 02/09/2021, Discharged on 02/10/2021  Component Date  Value Ref Range Status   Lactic Acid, Venous 02/10/2021 1.1  0.5 - 1.9 mmol/L Final   Performed at [place] [address] [place], [place] [address]   Lactic Acid, Venous 02/09/2021 0.8  0.5 - 1.9 mmol/L Final   Performed at [place] [address], [place], [place] [address]   Sodium 02/09/2021 136   135 - 145 mmol/L Final   Potassium 02/09/2021 3.2 (L)  3.5 - 5.1 mmol/L Final   Chloride 02/09/2021 98  98 - 111 mmol/L Final   CO2 02/09/2021 27  22 - 32 mmol/L Final   Glucose, Bld 02/09/2021 103 (H)  70 - 99 mg/dL Final   Glucose reference range applies only to samples taken after fasting for at least 8 hours.   BUN 02/09/2021 12  8 - 23 mg/dL Final   Creatinine, Ser 02/09/2021 0.81  0.61 - 1.24 mg/dL Final   Calcium 02/09/2021 9.1  8.9 - 10.3 mg/dL Final   Total Protein 02/09/2021 7.3  6.5 - 8.1 g/dL Final   Albumin 02/09/2021 3.4 (L)  3.5 - 5.0 g/dL Final   AST 02/09/2021 13 (L)  15 - 41 U/L Final   ALT 02/09/2021 13  0 - 44 U/L Final   Alkaline Phosphatase 02/09/2021 60  38 - 126 U/L Final   Total Bilirubin 02/09/2021 1.1  0.3 - 1.2 mg/dL Final   GFR, Estimated 02/09/2021 >60  >60 mL/min Final   Comment: (NOTE) Calculated using the CKD-EPI Creatinine Equation (2021)    Anion gap 02/09/2021 11  5 - 15 Final   Performed at [facility], [place] [address], [place], [place] [address]   WBC 02/09/2021 12.8 (H)  4.0 - 10.5 K/uL Final   RBC 02/09/2021 4.31  4.22 - 5.81 MIL/uL Final   Hemoglobin 02/09/2021 12.7 (L)  13.0 - 17.0 g/dL Final   HCT 02/09/2021 37.8 (L)  39.0 - 52.0 % Final   MCV 02/09/2021 87.7  80.0 - 100.0 fL Final   MCH 02/09/2021 29.5  26.0 - 34.0 pg Final   MCHC 02/09/2021 33.6  30.0 - 36.0 g/dL Final   RDW 02/09/2021 12.3  11.5 - 15.5 % Final   Platelets 02/09/2021 368  150 - 400 K/uL Final   nRBC 02/09/2021 0.0  0.0 - 0.2 % Final   Neutrophils Relative % 02/09/2021 75  % Final   Neutro Abs 02/09/2021 9.6 (H)  1.7 - 7.7 K/uL Final   Lymphocytes Relative 02/09/2021 16  % Final   Lymphs Abs 02/09/2021 2.1  0.7 - 4.0 K/uL Final   Monocytes Relative 02/09/2021 8  % Final   Monocytes Absolute 02/09/2021 1.0  0.1 - 1.0 K/uL Final   Eosinophils Relative 02/09/2021 0  % Final   Eosinophils Absolute 02/09/2021 0.0  0.0 - 0.5 K/uL Final   Basophils  Relative 02/09/2021 0  % Final   Basophils Absolute 02/09/2021 0.0  0.0 - 0.1 K/uL Final   Immature Granulocytes 02/09/2021 1  % Final   Abs Immature Granulocytes 02/09/2021 0.09 (H)  0.00 - 0.07 K/uL Final   Performed at [facility], [place] [address] [place], [place] [address]   Color, Urine 02/10/2021 YELLOW  YELLOW Final   APPearance 02/10/2021 CLEAR  CLEAR Final   Specific Gravity, Urine 02/10/2021 1.015  1.005 - 1.030 Final   pH 02/10/2021 6.0  5.0 - 8.0 Final   Glucose, UA 02/10/2021 NEGATIVE  NEGATIVE mg/dL Final   Hgb  urine dipstick 02/10/2021 NEGATIVE  NEGATIVE Final   Bilirubin Urine 02/10/2021 NEGATIVE  NEGATIVE Final   Ketones, ur 02/10/2021 20 (A)  NEGATIVE mg/dL Final   Protein, ur 02/10/2021 NEGATIVE  NEGATIVE mg/dL Final   Nitrite 02/10/2021 NEGATIVE  NEGATIVE Final   Leukocytes,Ua 02/10/2021 NEGATIVE  NEGATIVE Final   Performed at [place] [address] [place], [place] [address]   Specimen Description 02/09/2021    Final                   Value:BLOOD LEFT ANTECUBITAL Performed at [place] [address], [place], [place] [address]    Special Requests 02/09/2021    Final                   Value:BOTTLES DRAWN AEROBIC AND ANAEROBIC Blood Culture adequate volume Performed at [place] [address], [place], [place] [address]    Culture 02/09/2021    Final                   Value:NO GROWTH 5 DAYS Performed at [place] [address], [place], [place] [address]    Report Status 02/09/2021 02/15/2021 FINAL   Final   Specimen Description 02/09/2021    Final                   Value:BLOOD LEFT ANTECUBITAL Performed at [facility], [place] [address], [place], [place] [address]    Special Requests 02/09/2021    Final                   Value:BOTTLES DRAWN AEROBIC AND ANAEROBIC Blood Culture adequate volume Performed at [place] [address], [place], [place] [address]    Culture 02/09/2021    Final                   Value:NO GROWTH 5 DAYS Performed at [place] [address] [place], [place] [address]    Report Status 02/09/2021 02/15/2021 FINAL   Final   Sed Rate 02/09/2021 89 (H)  0 - 16 mm/hr Final   Performed at [facility], [place] [address], [place], [place] [address]   CRP 02/09/2021 14.7 (H)  <1.0 mg/dL Final   Performed at [place] [address], [place], [place] [address]    Allergies: Patient has no known allergies.  PTA Medications: (Not in a hospital admission)   Medical Decision Making  Patient admitted to continuous assessment unit for safety and to allow social work to consult with [facility] recovery and assist with other substance use rehab facilities. Lab Orders         Resp Panel by RT-PCR (Flu A&B, Covid) Nasopharyngeal Swab         Ethanol         POC SARS Coronavirus 2 Ag-ED - Nasal Swab         POCT Urine Drug Screen - ([facility])      Reviewed previous labs  Medication Management: Restarted home medications Meds ordered this encounter  Medications   acetaminophen (TYLENOL) tablet 650 mg   alum & mag hydroxide-simeth (MAALOX/MYLANTA) 200-200-20 MG/5ML suspension 30 mL   magnesium hydroxide (MILK OF MAGNESIA) suspension 30 mL   amLODipine (NORVASC) tablet 5 mg   gabapentin (NEURONTIN) capsule 200 mg   indomethacin (INDOCIN) capsule 25 mg   sertraline (ZOLOFT) tablet  50 mg   traZODone (DESYREL) tablet 50 mg       Recommendations  Based on my evaluation the patient does not appear to have an emergency medical condition. Admit to continuous assessment unit  Beula Joyner, NP 08/04/21  12:55 PM

## 2021-08-04 NOTE — Progress Notes (Signed)
Patient showered and ate breakfast.  Denied SI, HI, AVH.  MD in to visit with patient.  Patient stated he feels safe and ready for discharge.  AVS, RX and samples reviewed with patient and all questions answered.  Patient verbalized understanding of information presented.  Patient ambulated safely to [facility].  All belongings returned and belongings sheet signed.  Safe transport arrived to transport patient to [facility].  Patient discharged in stable condition; no acute distress noted.

## 2021-08-04 NOTE — Progress Notes (Signed)
Pt is admitted to Continuous OBS due to being denied admission at [facility] after he was discharged there this morning from [facility]. Pt is alert and oriented. Pt is ambulatory and is oriented to staff/unit. Pt was cooperative with skin assessment and admission process. Pt complained of back pain. Pt denies current SI/HI/AVH. Staff will monitor for pt's safety.

## 2021-08-04 NOTE — ED Notes (Signed)
Pt is resting in the bed at present. Respirations are even and unlabored. No acute distress noted. Will continue to monitor for safety.

## 2021-08-04 NOTE — ED Provider Notes (Signed)
[facility]/OBS ASAP Discharge Summary  Date and Time: 08/04/2021 8:15 AM  Name: Willie Collins  MRN:  [id number]   Discharge Diagnoses:  Final diagnoses:  Substance induced mood disorder (HCC)    Subjective:  Patient seen and chart reviewed.  He has been medication compliant with scheduled medications, he has been appropriate with staff and peers on the unit and has not been a management issue.  He has been attending groups.  Patient interviewed this morning he reports his mood is "good".  He expresses that he feels excited to go today [facility] and that he is ready to continue substance use treatment.  He denies SI/HI/AVH.  He denies all physical complaints apart from some minor back pain which he attributes to the bed and tossing and turning all night.  He denies alcohol withdrawal symptoms of headache, nausea, vomiting, GI upset, sweating, tremors.  Patient's affect is much brighter than it had been previously.  Discussed with patient that safe transport will be called who will then transport him to day [facility] for substance use treatment-patient verbalized understanding and expressed gratitude for the services that he has received while at the [facility].  Stay Summary:  60 year old male with past reported psychiatric history of alcohol use disorder and depression who presented to the [facility] on 11/15 with chief complaint of suicidal thoughts and alcohol use in the setting of recent relapse (vodka) on Friday 11/11; last drink 11/15.  Patient was transferred from the [facility] to the [facility] on 11/16 for continued treatment of mood and alcohol detox.  Patient started on alcohol detox protocol and Ativan taper on 11/16. -Ativan taper-1 mg Ativan 4 times daily followed by 1 mg Ativan 3 times daily followed by 1 mg Ativan twice daily followed by 1 mg Ativan times once; Ativan taper scheduled to end on 11/20.  Patient accepted to day [facility] for rehab on 11/21.  Patient was appropriate with staff and peers throughout stay at the [facility],  was medication compliant and was not a management issue.  He attended groups.  On day of discharge he verbalized readiness for discharge and denied SI/HI/AVH-see above for day of discharge interview.  On day of discharge interview, patient is in NAD, alert, oriented, calm, cooperative, and attentive, with normal affect, speech, and behavior. Objectively, there is no evidence of psychosis/ mania (able to converse coherently, linear and goal directed thought, no RIS, no distractibility, not pre-occupied, no FOI, etc) nor depression to the point of suicidality (able to concentrate, affect full and reactive, speech normal r/v/t, no psychomotor retardation/agitation, etc).  Overall, patient appears to be at the point, in the absence of inhibiting or disinhibiting symptoms, where he can successfully move to lesser restrictive setting for care.  Total Time spent with patient: 15 minutes  Past Psychiatric History: AUD, MDD, anxiety Past Medical History:  Past Medical History:  Diagnosis Date   Hypertension     Past Surgical History:  Procedure Laterality Date   IRRIGATION AND DEBRIDEMENT FOOT Left 11/25/2020   Procedure: IRRIGATION AND DEBRIDEMENT FOOT;  Surgeon: Candelaria Stagers, DPM;  Location: [facility];  Service: Podiatry;  Laterality: Left;   Family History: No family history on file. Family Psychiatric History:  Father with alcoholism Sister with drug use (deceased) Sister diagnosed with bipolar disorder Social History:  Social History   Substance and Sexual Activity  Alcohol Use Yes   Comment: weekends     Social History   Substance and Sexual Activity  Drug Use Never    Social  History   Socioeconomic History   Marital status: Single    Spouse name: Not on file   Number of children: Not on file   Years of education: Not on file   Highest education level: Not on file  Occupational History   Not on file  Tobacco Use   Smoking status: Never   Smokeless tobacco: Never  Vaping  Use   Vaping Use: Never used  Substance and Sexual Activity   Alcohol use: Yes    Comment: weekends   Drug use: Never   Sexual activity: Yes    Partners: Female  Other Topics Concern   Not on file  Social History Narrative   Not on file   Social Determinants of Health   Financial Resource Strain: Not on file  Food Insecurity: Not on file  Transportation Needs: Not on file  Physical Activity: Not on file  Stress: Not on file  Social Connections: Not on file   SDOH:  SDOH Screenings   Alcohol Screen: Not on file  Depression (PHQ2-9): Not on file  Financial Resource Strain: Not on file  Food Insecurity: Not on file  Housing: Not on file  Physical Activity: Not on file  Social Connections: Not on file  Stress: Not on file  Tobacco Use: Low Risk    Smoking Tobacco Use: Never   Smokeless Tobacco Use: Never   Passive Exposure: Not on file  Transportation Needs: Not on file    Tobacco Cessation:  N/A, patient does not currently use tobacco products  Current Medications:  Current Facility-Administered Medications  Medication Dose Route Frequency Provider Last Rate Last Admin   acetaminophen (TYLENOL) tablet 650 mg  650 mg Oral Q6H PRN Revonda Humphrey, NP   650 mg at 08/03/21 1242   alum & mag hydroxide-simeth (MAALOX/MYLANTA) 200-200-20 MG/5ML suspension 30 mL  30 mL Oral Q4H PRN Revonda Humphrey, NP       amLODipine (NORVASC) tablet 5 mg  5 mg Oral Daily Ival Bible, MD   5 mg at 08/03/21 1109   gabapentin (NEURONTIN) capsule 200 mg  200 mg Oral TID Ival Bible, MD   200 mg at 08/03/21 2114   indomethacin (INDOCIN) capsule 25 mg  25 mg Oral TID PRN Ival Bible, MD       magnesium hydroxide (MILK OF MAGNESIA) suspension 30 mL  30 mL Oral Daily PRN Revonda Humphrey, NP   30 mL at 08/01/21 1031   multivitamin with minerals tablet 1 tablet  1 tablet Oral Daily Revonda Humphrey, NP   1 tablet at 08/03/21 1103   sertraline (ZOLOFT) tablet 50  mg  50 mg Oral Daily Penn, Cicely, NP   50 mg at 08/03/21 1108   thiamine tablet 100 mg  100 mg Oral Daily Revonda Humphrey, NP   100 mg at 08/03/21 1109   traZODone (DESYREL) tablet 100 mg  100 mg Oral QHS PRN Nwoko, Uchenna E, PA   100 mg at 08/03/21 2114   Current Outpatient Medications  Medication Sig Dispense Refill   amLODipine (NORVASC) 5 MG tablet Take 1 tablet (5 mg total) by mouth daily. 30 tablet 1   indomethacin (INDOCIN) 25 MG capsule Take 1 capsule (25 mg total) by mouth 3 (three) times daily with meals. 42 capsule 0   sertraline (ZOLOFT) 50 MG tablet Take 1 tablet (50 mg total) by mouth daily. 14 tablet 0   traZODone (DESYREL) 50 MG tablet Take 1 tablet (50  mg total) by mouth at bedtime as needed for sleep. 14 tablet 0   traZODone (DESYREL) 50 MG tablet Take 1 tablet (50 mg total) by mouth at bedtime as needed for sleep. 30 tablet 1   amLODipine (NORVASC) 5 MG tablet Take 1 tablet (5 mg total) by mouth daily. 14 tablet 1   gabapentin (NEURONTIN) 100 MG capsule Take 2 capsules (200 mg total) by mouth 3 (three) times daily. 180 capsule 1   gabapentin (NEURONTIN) 100 MG capsule Take 2 capsules (200 mg total) by mouth 3 (three) times daily. 84 capsule 0   indomethacin (INDOCIN) 25 MG capsule Take 1 capsule (25 mg total) by mouth 3 (three) times daily as needed for moderate pain or mild pain (gout pain). 90 capsule 1   sertraline (ZOLOFT) 50 MG tablet Take 1 tablet (50 mg total) by mouth daily. 30 tablet 1    PTA Medications: (Not in a hospital admission)   Musculoskeletal  Strength & Muscle Tone: within normal limits Gait & Station: normal Patient leans: N/A  Psychiatric Specialty Exam  Presentation  General Appearance: Appropriate for Environment  Eye Contact:Good  Speech:Clear and Coherent; Normal Rate  Speech Volume:Normal  Handedness:Right   Mood and Affect  Mood:Anxious  Affect:Congruent   Thought Process  Thought Processes:Coherent  Descriptions of  Associations:Intact  Orientation:Full (Time, Place and Person)  Thought Content:Logical  Diagnosis of Schizophrenia or Schizoaffective disorder in past: No  Duration of Psychotic Symptoms: n/a  Hallucinations:Hallucinations: None  Ideas of Reference:None  Suicidal Thoughts:Suicidal Thoughts: No  Homicidal Thoughts:Homicidal Thoughts: No   Sensorium  Memory:Immediate Good; Recent Good; Remote Good  Judgment:Good  Insight:Good   Executive Functions  Concentration:Good  Attention Span:Good  [place]ver of Knowledge:Good  Language:Good   Psychomotor Activity  Psychomotor Activity:Psychomotor Activity: Normal   Assets  Assets:Communication Skills; Desire for Improvement; Financial Resources/Insurance; Housing; Physical Health; Resilience; Social Support; Vocational/Educational   Sleep  Sleep:poor  No data recorded  Physical Exam  Physical Exam Constitutional:      Appearance: Normal appearance. He is normal weight.  HENT:     Head: Normocephalic and atraumatic.  Eyes:     Extraocular Movements: Extraocular movements intact.  Pulmonary:     Effort: Pulmonary effort is normal.  Neurological:     General: No focal deficit present.     Mental Status: He is alert and oriented to person, place, and time.  Psychiatric:        Attention and Perception: Attention and perception normal.        Speech: Speech normal.        Behavior: Behavior normal. Behavior is cooperative.        Thought Content: Thought content normal.   Review of Systems  Constitutional:  Negative for chills and fever.  HENT:  Negative for hearing loss.   Eyes:  Negative for discharge and redness.  Respiratory:  Negative for cough and shortness of breath.   Cardiovascular:  Negative for chest pain.  Gastrointestinal:  Negative for abdominal pain, constipation, diarrhea, nausea and vomiting.  Musculoskeletal:  Negative for myalgias.  Neurological:  Negative for headaches.   Psychiatric/Behavioral:  Positive for substance abuse. Negative for depression, hallucinations and suicidal ideas. The patient has insomnia. The patient is not nervous/anxious.   Blood pressure 129/75, pulse 70, temperature (!) 97.3 F (36.3 C), temperature source Temporal, resp. rate 18, SpO2 99 %. There is no height or weight on file to calculate BMI.  Demographic Factors:  Male and Low socioeconomic  status  Loss Factors: Decrease in vocational status and Financial problems/change in socioeconomic status  Historical Factors: Family history of mental illness or substance abuse  Risk Reduction Factors:   Sense of responsibility to family, Living with another person, especially a relative, Positive social support, and Positive coping skills or problem solving skills  Continued Clinical Symptoms:  Depression:   Comorbid alcohol abuse/dependence Alcohol/Substance Abuse/Dependencies Previous Psychiatric Diagnoses and Treatments Medical Diagnoses and Treatments/Surgeries  Cognitive Features That Contribute To Risk:  None    Suicide Risk:  Minimal: No identifiable suicidal ideation.  Patients presenting with no risk factors but with morbid ruminations; may be classified as minimal risk based on the severity of the depressive symptoms  Plan Of Care/Follow-up recommendations:  Activity:  as toelrated Diet:  regular Other:     Patient is instructed prior to discharge to: Take all medications as prescribed by his/her mental healthcare provider. Report any adverse effects and or reactions from the medicines to his/her outpatient provider promptly. Patient has been instructed & cautioned: To not engage in alcohol and or illegal drug use while on prescription medicines. In the event of worsening symptoms, patient is instructed to call the crisis hotline, 911 and or go to the nearest ED for appropriate evaluation and treatment of symptoms. To follow-up with his/her primary care provider for  your other medical issues, concerns and or health care needs.    TAKE these medications      amLODipine 5 MG tablet Commonly known as: NORVASC Take 1 tablet (5 mg total) by mouth daily.   gabapentin 100 MG capsule Commonly known as: NEURONTIN Take 2 capsules (200 mg total) by mouth 3 (three) times daily.      indomethacin 25 MG capsule Commonly known as: INDOCIN Take 1 capsule (25 mg total) by mouth 3 (three) times daily as needed for moderate pain or mild pain (gout pain). What changed: reasons to take this      sertraline 50 MG tablet Commonly known as: ZOLOFT Take 1 tablet (50 mg total) by mouth daily.       traZODone 50 MG tablet Commonly known as: DESYREL Take 1 tablet (50 mg total) by mouth at bedtime as needed for sleep.       Patient provided a 7-day samples of above medications well prescription for 30 days with 1 refill.  [facility]: [facility] via safe transport  Ival Bible, MD 08/04/2021, 8:15 AM

## 2021-08-04 NOTE — ED Notes (Signed)
Pt received dinner. 

## 2021-08-04 NOTE — Progress Notes (Signed)
[facility] LCSW Note   08/04/2021    2:27 PM   Type of Contact and Topic:  SUD treatment referral    CSW  faxed referral for [facility] by patient request. Willie Collins, patient, provided written consent for CSW team to coordinate referral at this time. Consent document provided to patient access for processing.     Signed:  Corky Crafts, MSW, [place], LCASA 08/04/2021 2:27 PM

## 2021-08-04 NOTE — BH Assessment (Addendum)
Comprehensive Clinical Assessment (CCA) Screening, Triage and Referral Note  08/04/2021 Willie Collins [id number]  Disposition: Per Assunta Found, NP, patient is recommended for continuous assessment.   Flowsheet Row ED from 07/29/2021 in [facility] ED from 07/06/2021 in [facility] EMERGENCY DEPARTMENT ED from 06/23/2021 in [facility]  C-SSRS RISK CATEGORY Low Risk High Risk No Risk      Willie Collins is a 60 year old male presenting to [facility] voluntarily. Patient discharged from [facility] this morning with plans to receive treatment at [facility]. Patient reports he was going through admission process at [facility] and after they checked his insurance (Medicaid) they told him he could not be treated there due to [place] of [place]. Patient reports [facility] provided him transportation back to [facility]. Patient reports having "high hopes" this morning for treatment and when he was told he could not receive treatment he thought about "giving up". Patient denies active SI but reports thoughts has crossed his mind. Patient denies SI, HI, AVH and has not used any alcohol since leaving [facility] this morning. Patient continues to be motivated for treatment.  CSW to assist with issues at [facility] and possibly locating more resources.    Chief Complaint:  Chief Complaint  Patient presents with   Alcohol Problem   Visit Diagnosis: Substance induced mood disorder (HCC)  Patient Reported Information How did you hear about [place]? Self  What Is the Reason for Your Visit/Call Today? Discharged from [facility] to [facility] today. Pt was sent back to [facility] from [facility] due to insurance issues.  How Long Has This Been Causing You Problems? 1 wk - 1 month  What Do You Feel Would Help You the Most Today? Treatment for Depression or other mood problem   Have You Recently Had Any Thoughts About Hurting Yourself? No  Are You  Planning to Commit Suicide/Harm Yourself At This time? No   Have you Recently Had Thoughts About Hurting Someone [name]? No  Are You Planning to Harm Someone at This Time? No  Explanation: No data recorded  Have You Used Any Alcohol or Drugs in the Past 24 Hours? No  How Long Ago Did You Use Drugs or Alcohol? No data recorded What Did You Use and How Much? unknown   Do You Currently Have a Therapist/Psychiatrist? No  Name of Therapist/Psychiatrist: [name]   Have You Been Recently Discharged From Any Office Practice or Programs? No  Explanation of Discharge From Practice/Program: No data recorded   CCA Screening Triage Referral Assessment Type of Contact: Face-to-Face  Telemedicine Service Delivery:   Is this Initial or Reassessment? Initial Assessment  Date Telepsych consult ordered in [facility]:  07/06/21  Time Telepsych consult ordered in [facility]:  1934  Location of Assessment: [facility]  Provider Location: [facility] Assessment Services   Collateral Involvement: none   Does Patient Have a Automotive engineer Guardian? No data recorded Name and Contact of Legal Guardian: No data recorded If Minor and Not Living with Parent(s), Who has Custody? N/A  Is CPS involved or ever been involved? Never  Is APS involved or ever been involved? Never   Patient Determined To Be At Risk for Harm To Self or Others Based on Review of Patient Reported Information or Presenting Complaint? No  Method: No data recorded Availability of Means: No data recorded Intent: No data recorded Notification Required: No data recorded Additional Information for Danger to Others Potential: No data recorded  Additional Comments for Danger to Others Potential: No data recorded Are There Guns or Other Weapons in Your Home? No data recorded Types of Guns/Weapons: No data recorded Are These Weapons Safely Secured?                            No data recorded Who Could Verify You  Are Able To Have These Secured: No data recorded Do You Have any Outstanding Charges, Pending Court Dates, Parole/Probation? No data recorded Contacted To Inform of Risk of Harm To Self or Others: No data recorded  Does Patient Present under Involuntary Commitment? No  IVC Papers Initial File Date: No data recorded  [place] of Residence: [place]   Patient Currently Receiving the Following Services: [facility] (Substance Abuse Intensive Outpatient Program; Individual Therapy   Determination of Need: Urgent (48 hours)   Options For Referral: Medication Management; Outpatient Therapy; Other: Comment   Discharge Disposition:     Audree Camel, [facility]

## 2021-08-04 NOTE — ED Notes (Signed)
Pt came to nurses station and requested pain medication for headache.

## 2021-08-04 NOTE — ED Notes (Signed)
Offsite distribution called to pick up STAT labs and to deliver to [facility]. 

## 2021-08-05 ENCOUNTER — Encounter (HOSPITAL_COMMUNITY): Payer: Self-pay | Admitting: Registered Nurse

## 2021-08-05 DIAGNOSIS — F4323 Adjustment disorder with mixed anxiety and depressed mood: Secondary | ICD-10-CM | POA: Diagnosis not present

## 2021-08-05 DIAGNOSIS — F1994 Other psychoactive substance use, unspecified with psychoactive substance-induced mood disorder: Secondary | ICD-10-CM | POA: Diagnosis not present

## 2021-08-05 DIAGNOSIS — F1910 Other psychoactive substance abuse, uncomplicated: Secondary | ICD-10-CM | POA: Diagnosis present

## 2021-08-05 DIAGNOSIS — F332 Major depressive disorder, recurrent severe without psychotic features: Secondary | ICD-10-CM | POA: Diagnosis not present

## 2021-08-05 DIAGNOSIS — R45851 Suicidal ideations: Secondary | ICD-10-CM | POA: Diagnosis not present

## 2021-08-05 MED ORDER — ONDANSETRON 4 MG PO TBDP: 4.0000 mg | ORAL_TABLET | Freq: Four times a day (QID) | ORAL | Status: DC | PRN

## 2021-08-05 MED ORDER — HYDROXYZINE HCL 25 MG PO TABS
25.0000 mg | ORAL_TABLET | Freq: Three times a day (TID) | ORAL | Status: DC | PRN
  Administered 2021-08-05 (×3): 25 mg via ORAL
  Filled 2021-08-05 (×3): qty 1

## 2021-08-05 MED ORDER — LOPERAMIDE HCL 2 MG PO CAPS: 2.0000 mg | ORAL_CAPSULE | ORAL | Status: DC | PRN

## 2021-08-05 NOTE — ED Notes (Signed)
Patient admitted to [facility] from [facility].  He is pleasant and cooperative however is very anxious with visible tremors due to anxiety around current life situation and possible admittance to rehab.  Support provided.  He was cooperative with admission process and was shown the unit and his room.  He verbalized understanding of unit rules.  Will monitor and provide a safe supportive environment.

## 2021-08-05 NOTE — Discharge Instructions (Signed)
Please begin to call and apply for the following residential substance use treatment programs:   [facility]  [address], [place], [place] [address] [phone]  [facility]  [address], [place], [place], [place] [address] [phone]  [facility] ([facility]  [address], [place], [place] [address] [phone]  [facility] ([facility]) [address] [place], [place] [address] [phone]  [facility] [address], [place], [place] [address] [phone]  [facility] [place]  [address], [place],  [address] [phone] 

## 2021-08-05 NOTE — ED Notes (Signed)
Pt asleep with even and unlabored respirations. No distress or discomfort noted. Pt remains safe on the unit. Will continue to monitor. 

## 2021-08-05 NOTE — ED Notes (Signed)
Pt denies SI,HI,AVH. Pt is in the dinning room watching TV at present.. Patient is cooperative and interacts well with staff.  Respiratory is even and unlabored. No distress noted.  Patient stated no complaints at present. will continue to monitor for safety.

## 2021-08-05 NOTE — ED Provider Notes (Signed)
Behavioral Health Admission H&P [facility] & OBS)  Date: 08/05/21 Patient Name: Willie Collins MRN: [id number] Chief Complaint:  Chief Complaint  Patient presents with   Alcohol Problem      Diagnoses:  Final diagnoses:  Passive suicidal ideations  Substance induced mood disorder ([place])  Adjustment disorder with mixed anxiety and depressed mood  Severe episode of recurrent major depressive disorder, without psychotic features ([place])    HPI: Willie Collins, 60 y.o., male admitted to [facility] continuous assessment unit after a brief discharge from facility based crisis unit.  Patient was accepted to [facility] in [facility] but once there he was declined stating that his Medicaid was [facility] and not [facility]. [facility] provided transportation back to [facility] where patient presented with passive suicidal ideation related to not being able to get into a program to help with alcohol use disorder.    Willie Collins seen face to face for psychiatric reassessment by this provider , consulted with Dr. Ernie Hew; and chart reviewed on 08/05/21.  On evaluation Willie Collins reports he has spoken to [facility] and was informed that had male beds available but would need the referral to come from his probation officer instead of facility social work.  Patient spoke to his probation officer Olivia Canter at [phone]) and was informed that she was unaware what needed to be done because staff that usually handles out for holiday.  Stated she would call to see if verbal referral would be enough.  Patient states every time he gets his hopes up something happens to dampen them.  Reported some passive suicidal ideation that is more conditional of getting help with his alcohol use disorder.  Reports he is tolerating his medications without adverse reaction, sleeping and eating without difficulty.  Denies homicidal ideation, psychosis, and paranoia During evaluation Willie  Collins is sitting upright in chair in no acute distress.  He is alert, oriented x 4, calm, cooperative and attentive.  His mood is anxious but somewhat euthymic with congruent affect.  He has normal speech, and behavior.  Objectively there is no evidence of psychosis/mania or delusional thinking.  Patient is able to converse coherently, goal directed thoughts, no distractibility, or pre-occupation.  He also denies homicidal ideation, psychosis, and paranoia; but endorses passive suicidal ideation that is conditional with no intent or plan voiced.  Patient answered question appropriately.     PHQ 2-9:   [place] ED from 08/04/2021 in [facility] ED from 07/29/2021 in [facility] ED from 07/06/2021 in [place] Error: Q3, 4, or 5 should not be populated when Q2 is No Low Risk High Risk        Total Time spent with patient: 20 minutes  Musculoskeletal  Strength & Muscle Tone: within normal limits Gait & Station: normal Patient leans: N/A  Psychiatric Specialty Exam  Presentation General Appearance: Appropriate for Environment  Eye Contact:Good  Speech:Clear and Coherent; Normal Rate  Speech Volume:Normal  Handedness:Right   Mood and Affect  Mood:Anxious  Affect:Congruent   Thought Process  Thought Processes:Coherent; Goal Directed  Descriptions of Associations:Intact  Orientation:Full (Time, Place and Person)  Thought Content:Logical; WDL  Diagnosis of Schizophrenia or Schizoaffective disorder in past: No   Hallucinations:Hallucinations: None  Ideas of Reference:None  Suicidal Thoughts:Suicidal Thoughts: Yes, Passive SI Active Intent and/or Plan: Without Intent; Without Plan (Passive suicidal thoughts more condictional related to alcohol use disorder and not being  able to get into treatment)  Homicidal Thoughts:Homicidal Thoughts:  No   Sensorium  Memory:Immediate Good; Recent Good; Remote Good  Judgment:Intact  Insight:Present   Executive Functions  Concentration:Good  Attention Span:Good  [place] of Knowledge:Good  Language:Good   Psychomotor Activity  Psychomotor Activity:Psychomotor Activity: Normal   Assets  Assets:Communication Skills; Desire for Improvement; Housing; Catering manager; Resilience; Social Support   Sleep  Sleep:Sleep: Good   Nutritional Assessment (For OBS and FBC admissions only) Has the patient had a weight loss or gain of 10 pounds or more in the last 3 months?: No Has the patient had a decrease in food intake/or appetite?: No Does the patient have dental problems?: No Does the patient have eating habits or behaviors that may be indicators of an eating disorder including binging or inducing vomiting?: No Has the patient recently lost weight without trying?: 1 Has the patient been eating poorly because of a decreased appetite?: 0 Malnutrition Screening Tool Score: 1 Nutritional Assessment Referrals: Other (comment) (Alcohol detox and treatment)   Physical Exam Vitals and nursing note reviewed. Exam conducted with a chaperone present.  Constitutional:      General: He is not in acute distress.    Appearance: Normal appearance. He is not ill-appearing.  Cardiovascular:     Rate and Rhythm: Normal rate.  Pulmonary:     Effort: Pulmonary effort is normal.  Musculoskeletal:        General: Normal range of motion.     Cervical back: Normal range of motion.  Skin:    General: Skin is warm and dry.  Neurological:     Mental Status: He is alert and oriented to person, place, and time.  Psychiatric:        Attention and Perception: Attention and perception normal. He does not perceive auditory or visual hallucinations.        Mood and Affect: Affect normal. Mood is anxious.        Speech: Speech normal.        Behavior: Behavior normal.  Behavior is cooperative.        Thought Content: Thought content is not paranoid or delusional. Thought content does not include homicidal ideation. Suicidal: Continues to report some passive suicidal thoughts if unable to get into a rehab treatment program.  No intent or plan voiced.Thought content does not include suicidal plan.        Cognition and Memory: Cognition and memory normal.        Judgment: Judgment normal.   Review of Systems  Constitutional: Negative.   HENT: Negative.    Eyes: Negative.   Respiratory: Negative.    Cardiovascular: Negative.   Gastrointestinal: Negative.   Genitourinary: Negative.   Musculoskeletal: Negative.   Skin: Negative.   Neurological: Negative.   Endo/Heme/Allergies: Negative.   Psychiatric/Behavioral:  Positive for substance abuse (Alcohol). Depression: Stable. Hallucinations: Denies. Suicidal ideas: Passive thoughts no plan or intent.Nervous/anxious: Stable.    Blood pressure 128/86, pulse 68, temperature 97.9 F (36.6 C), temperature source Oral, resp. rate 18, SpO2 98 %. There is no height or weight on file to calculate BMI.  Past Psychiatric History: See above   Is the patient at risk to self? Yes  Has the patient been a risk to self in the past 6 months? No .    Has the patient been a risk to self within the distant past? No   Is the patient a risk to others? No   Has the patient been a  risk to others in the past 6 months? No   Has the patient been a risk to others within the distant past? No   Past Medical History:  Past Medical History:  Diagnosis Date   Hypertension     Past Surgical History:  Procedure Laterality Date   IRRIGATION AND DEBRIDEMENT FOOT Left 11/25/2020   Procedure: IRRIGATION AND DEBRIDEMENT FOOT;  Surgeon: Felipa Furnace, DPM;  Location: [facility];  Service: Podiatry;  Laterality: Left;    Family History: History reviewed. No pertinent family history.  Social History:  Social History   Socioeconomic History    Marital status: Single    Spouse name: Not on file   Number of children: Not on file   Years of education: Not on file   Highest education level: Not on file  Occupational History   Not on file  Tobacco Use   Smoking status: Never   Smokeless tobacco: Never  Vaping Use   Vaping Use: Never used  Substance and Sexual Activity   Alcohol use: Yes    Comment: weekends   Drug use: Never   Sexual activity: Yes    Partners: Female  Other Topics Concern   Not on file  Social History Narrative   Not on file   Social Determinants of Health   Financial Resource Strain: Not on file  Food Insecurity: Not on file  Transportation Needs: Not on file  Physical Activity: Not on file  Stress: Not on file  Social Connections: Not on file  Intimate Partner Violence: Not on file    SDOH:  SDOH Screenings   Alcohol Screen: Not on file  Depression (PHQ2-9): Not on file  Financial Resource Strain: Not on file  Food Insecurity: Not on file  Housing: Not on file  Physical Activity: Not on file  Social Connections: Not on file  Stress: Not on file  Tobacco Use: Low Risk    Smoking Tobacco Use: Never   Smokeless Tobacco Use: Never   Passive Exposure: Not on file  Transportation Needs: Not on file    Last Labs:  Admission on 08/04/2021  Component Date Value Ref Range Status   SARS Coronavirus 2 by RT PCR 08/04/2021 NEGATIVE  NEGATIVE Final   Comment: (NOTE) SARS-CoV-2 target nucleic acids are NOT DETECTED.  The SARS-CoV-2 RNA is generally detectable in upper respiratory specimens during the acute phase of infection. The lowest concentration of SARS-CoV-2 viral copies this assay can detect is 138 copies/mL. A negative result does not preclude SARS-Cov-2 infection and should not be used as the sole basis for treatment or other patient management decisions. A negative result may occur with  improper specimen collection/handling, submission of specimen other than nasopharyngeal  swab, presence of viral mutation(s) within the areas targeted by this assay, and inadequate number of viral copies(<138 copies/mL). A negative result must be combined with clinical observations, patient history, and epidemiological information. The expected result is Negative.  Fact Sheet for Patients:  EntrepreneurPulse.com.au  Fact Sheet for Healthcare Providers:  IncredibleEmployment.be  This test is no                          t yet approved or cleared by the [place] FDA and  has been authorized for detection and/or diagnosis of SARS-CoV-2 by FDA under an Emergency Use Authorization (EUA). This EUA will remain  in effect (meaning this test can be used) for the duration of the COVID-19 declaration under Section  564(b)(1) of the Act, 21 U.S.C.section 360bbb-3(b)(1), unless the authorization is terminated  or revoked sooner.       Influenza A by PCR 08/04/2021 NEGATIVE  NEGATIVE Final   Influenza B by PCR 08/04/2021 NEGATIVE  NEGATIVE Final   Comment: (NOTE) The Xpert Xpress SARS-CoV-2/FLU/RSV plus assay is intended as an aid in the diagnosis of influenza from Nasopharyngeal swab specimens and should not be used as a sole basis for treatment. Nasal washings and aspirates are unacceptable for Xpert Xpress SARS-CoV-2/FLU/RSV testing.  Fact Sheet for Patients: EntrepreneurPulse.com.au  Fact Sheet for Healthcare Providers: IncredibleEmployment.be  This test is not yet approved or cleared by the [place] FDA and has been authorized for detection and/or diagnosis of SARS-CoV-2 by FDA under an Emergency Use Authorization (EUA). This EUA will remain in effect (meaning this test can be used) for the duration of the COVID-19 declaration under Section 564(b)(1) of the Act, 21 U.S.C. section 360bbb-3(b)(1), unless the authorization is terminated or revoked.  Performed at [facility], [place] [address] [place], [place] [address]    SARS Coronavirus 2 Ag 08/04/2021 Negative  Negative Final   POC Amphetamine UR 08/04/2021 None Detected  NONE DETECTED (Cut Off Level 1000 ng/mL) Final   POC Secobarbital (BAR) 08/04/2021 None Detected  NONE DETECTED (Cut Off Level 300 ng/mL) Final   POC Buprenorphine (BUP) 08/04/2021 None Detected  NONE DETECTED (Cut Off Level 10 ng/mL) Final   POC Oxazepam (BZO) 08/04/2021 None Detected  NONE DETECTED (Cut Off Level 300 ng/mL) Final   POC Cocaine UR 08/04/2021 None Detected  NONE DETECTED (Cut Off Level 300 ng/mL) Final   POC Methamphetamine UR 08/04/2021 None Detected  NONE DETECTED (Cut Off Level 1000 ng/mL) Final   POC Morphine 08/04/2021 None Detected  NONE DETECTED (Cut Off Level 300 ng/mL) Final   POC Oxycodone UR 08/04/2021 None Detected  NONE DETECTED (Cut Off Level 100 ng/mL) Final   POC Methadone UR 08/04/2021 None Detected  NONE DETECTED (Cut Off Level 300 ng/mL) Final   POC Marijuana UR 08/04/2021 Positive (A)  NONE DETECTED (Cut Off Level 50 ng/mL) Final   Alcohol, Ethyl (B) 08/04/2021 <10  <10 mg/dL Final   Comment: (NOTE) Lowest detectable limit for serum alcohol is 10 mg/dL.  For medical purposes only. Performed at [facility], [place] [address], [place], [place] [address]    SARSCOV2ONAVIRUS 2 AG 08/04/2021 NEGATIVE  NEGATIVE Final   Comment: (NOTE) SARS-CoV-2 antigen NOT DETECTED.   Negative results are presumptive.  Negative results do not preclude SARS-CoV-2 infection and should not be used as the sole basis for treatment or other patient management decisions, including infection  control decisions, particularly in the presence of clinical signs and  symptoms consistent with COVID-19, or in those who have been in contact with the virus.  Negative results must be combined with clinical observations, patient history, and epidemiological information. The expected result is Negative.  Fact Sheet for Patients:  HandmadeRecipes.com.cy  Fact Sheet for Healthcare Providers: FuneralLife.at  This test is not yet approved or cleared by the [place] FDA and  has been authorized for detection and/or diagnosis of SARS-CoV-2 by FDA under an Emergency Use Authorization (EUA).  This EUA will remain in effect (meaning this test can be used) for the duration of  the COV                          ID-19 declaration under Section  564(b)(1) of the Act, 21 U.S.C. section 360bbb-3(b)(1), unless the authorization is terminated or revoked sooner.    Admission on 07/29/2021, Discharged on 08/04/2021  Component Date Value Ref Range Status   SARS Coronavirus 2 by RT PCR 07/29/2021 NEGATIVE  NEGATIVE Final   Comment: (NOTE) SARS-CoV-2 target nucleic acids are NOT DETECTED.  The SARS-CoV-2 RNA is generally detectable in upper respiratory specimens during the acute phase of infection. The lowest concentration of SARS-CoV-2 viral copies this assay can detect is 138 copies/mL. A negative result does not preclude SARS-Cov-2 infection and should not be used as the sole basis for treatment or other patient management decisions. A negative result may occur with  improper specimen collection/handling, submission of specimen other than nasopharyngeal swab, presence of viral mutation(s) within the areas targeted by this assay, and inadequate number of viral copies(<138 copies/mL). A negative result must be combined with clinical observations, patient history, and epidemiological information. The expected result is Negative.  Fact Sheet for Patients:  EntrepreneurPulse.com.au  Fact Sheet for Healthcare Providers:  IncredibleEmployment.be  This test is no                          t yet approved or cleared by the [place] FDA and  has been authorized for detection and/or diagnosis of SARS-CoV-2 by FDA under an Emergency Use  Authorization (EUA). This EUA will remain  in effect (meaning this test can be used) for the duration of the COVID-19 declaration under Section 564(b)(1) of the Act, 21 U.S.C.section 360bbb-3(b)(1), unless the authorization is terminated  or revoked sooner.       Influenza A by PCR 07/29/2021 NEGATIVE  NEGATIVE Final   Influenza B by PCR 07/29/2021 NEGATIVE  NEGATIVE Final   Comment: (NOTE) The Xpert Xpress SARS-CoV-2/FLU/RSV plus assay is intended as an aid in the diagnosis of influenza from Nasopharyngeal swab specimens and should not be used as a sole basis for treatment. Nasal washings and aspirates are unacceptable for Xpert Xpress SARS-CoV-2/FLU/RSV testing.  Fact Sheet for Patients: EntrepreneurPulse.com.au  Fact Sheet for Healthcare Providers: IncredibleEmployment.be  This test is not yet approved or cleared by the [place] FDA and has been authorized for detection and/or diagnosis of SARS-CoV-2 by FDA under an Emergency Use Authorization (EUA). This EUA will remain in effect (meaning this test can be used) for the duration of the COVID-19 declaration under Section 564(b)(1) of the Act, 21 U.S.C. section 360bbb-3(b)(1), unless the authorization is terminated or revoked.  Performed at [facility], [place] [address], [place], [place] [address]    WBC 07/29/2021 4.8  4.0 - 10.5 K/uL Final   RBC 07/29/2021 5.45  4.22 - 5.81 MIL/uL Final   Hemoglobin 07/29/2021 16.5  13.0 - 17.0 g/dL Final   HCT 07/29/2021 48.6  39.0 - 52.0 % Final   MCV 07/29/2021 89.2  80.0 - 100.0 fL Final   MCH 07/29/2021 30.3  26.0 - 34.0 pg Final   MCHC 07/29/2021 34.0  30.0 - 36.0 g/dL Final   RDW 07/29/2021 15.0  11.5 - 15.5 % Final   Platelets 07/29/2021 289  150 - 400 K/uL Final   nRBC 07/29/2021 0.0  0.0 - 0.2 % Final   Neutrophils Relative % 07/29/2021 52  % Final   Neutro Abs 07/29/2021 2.5  1.7 - 7.7 K/uL Final   Lymphocytes Relative 07/29/2021  40  % Final   Lymphs Abs 07/29/2021 2.0  0.7 - 4.0 K/uL Final  Monocytes Relative 07/29/2021 6  % Final   Monocytes Absolute 07/29/2021 0.3  0.1 - 1.0 K/uL Final   Eosinophils Relative 07/29/2021 2  % Final   Eosinophils Absolute 07/29/2021 0.1  0.0 - 0.5 K/uL Final   Basophils Relative 07/29/2021 0  % Final   Basophils Absolute 07/29/2021 0.0  0.0 - 0.1 K/uL Final   Immature Granulocytes 07/29/2021 0  % Final   Abs Immature Granulocytes 07/29/2021 0.02  0.00 - 0.07 K/uL Final   Performed at [facility], [address] [place], [place] [address]   Sodium 07/29/2021 139  135 - 145 mmol/L Final   Potassium 07/29/2021 3.8  3.5 - 5.1 mmol/L Final   Chloride 07/29/2021 99  98 - 111 mmol/L Final   CO2 07/29/2021 25  22 - 32 mmol/L Final   Glucose, Bld 07/29/2021 74  70 - 99 mg/dL Final   Glucose reference range applies only to samples taken after fasting for at least 8 hours.   BUN 07/29/2021 8  8 - 23 mg/dL Final   Creatinine, Ser 07/29/2021 0.75  0.61 - 1.24 mg/dL Final   Calcium 19/14/782911/15/2022 9.2  8.9 - 10.3 mg/dL Final   Total Protein 56/21/308611/15/2022 7.1  6.5 - 8.1 g/dL Final   Albumin 57/84/696211/15/2022 4.2  3.5 - 5.0 g/dL Final   AST 95/28/413211/15/2022 55 (H)  15 - 41 U/L Final   ALT 07/29/2021 50 (H)  0 - 44 U/L Final   Alkaline Phosphatase 07/29/2021 61  38 - 126 U/L Final   Total Bilirubin 07/29/2021 1.1  0.3 - 1.2 mg/dL Final   GFR, Estimated 07/29/2021 >60  >60 mL/min Final   Comment: (NOTE) Calculated using the CKD-EPI Creatinine Equation (2021)    Anion gap 07/29/2021 15  5 - 15 Final   Performed at [facility], [address] [place], [place] [address]   Hgb A1c MFr Bld 07/29/2021 5.4  4.8 - 5.6 % Final   Comment: (NOTE) Pre diabetes:          5.7%-6.4%  Diabetes:              >6.4%  Glycemic control for   <7.0% adults with diabetes    Mean Plasma Glucose 07/29/2021 108.28  mg/dL Final   Performed at [facility], [address] [place], [place] [address]    Magnesium 07/29/2021 1.6 (L)  1.7 - 2.4 mg/dL Final   Performed at [facility], [address] [place], [place] [address]   Alcohol, Ethyl (B) 07/29/2021 277 (H)  <10 mg/dL Final   Comment: (NOTE) Lowest detectable limit for serum alcohol is 10 mg/dL.  For medical purposes only. Performed at [facility], [address] [place], [place] [address]    Cholesterol 07/29/2021 259 (H)  0 - 200 mg/dL Final   Triglycerides 59/56/387511/15/2022 62  <150 mg/dL Final   HDL 64/33/295111/15/2022 >135  >40 mg/dL Final   Total CHOL/HDL Ratio 07/29/2021 NOT CALCULATED  RATIO Final   VLDL 07/29/2021 12  0 - 40 mg/dL Final   LDL Cholesterol 07/29/2021 NOT CALCULATED  0 - 99 mg/dL Final   Performed at [facility], [address] [place], [place] [address]   TSH 07/29/2021 0.545  0.350 - 4.500 uIU/mL Final   Comment: Performed by a 3rd Generation assay with a functional sensitivity of <=0.01 uIU/mL. Performed at [facility], [address] [place], [place] [address]  Color, Urine 07/29/2021 YELLOW  YELLOW Final   APPearance 07/29/2021 CLEAR  CLEAR Final   Specific Gravity, Urine 07/29/2021 1.005  1.005 - 1.030 Final   pH 07/29/2021 5.0  5.0 - 8.0 Final   Glucose, UA 07/29/2021 NEGATIVE  NEGATIVE mg/dL Final   Hgb urine dipstick 07/29/2021 NEGATIVE  NEGATIVE Final   Bilirubin Urine 07/29/2021 NEGATIVE  NEGATIVE Final   Ketones, ur 07/29/2021 NEGATIVE  NEGATIVE mg/dL Final   Protein, ur 07/29/2021 NEGATIVE  NEGATIVE mg/dL Final   Nitrite 07/29/2021 NEGATIVE  NEGATIVE Final   Leukocytes,Ua 07/29/2021 NEGATIVE  NEGATIVE Final   WBC, UA 07/29/2021 0-5  0 - 5 WBC/hpf Final   Bacteria, UA 07/29/2021 NONE SEEN  NONE SEEN Final   Performed at [facility], [place] [address] [place], [place] [address]   POC Amphetamine UR 07/29/2021 None Detected  NONE DETECTED (Cut Off Level 1000 ng/mL) Final   POC Secobarbital (BAR) 07/29/2021 None Detected  NONE DETECTED (Cut Off Level 300 ng/mL) Final    POC Buprenorphine (BUP) 07/29/2021 None Detected  NONE DETECTED (Cut Off Level 10 ng/mL) Final   POC Oxazepam (BZO) 07/29/2021 None Detected  NONE DETECTED (Cut Off Level 300 ng/mL) Final   POC Cocaine UR 07/29/2021 None Detected  NONE DETECTED (Cut Off Level 300 ng/mL) Final   POC Methamphetamine UR 07/29/2021 None Detected  NONE DETECTED (Cut Off Level 1000 ng/mL) Final   POC Morphine 07/29/2021 None Detected  NONE DETECTED (Cut Off Level 300 ng/mL) Final   POC Oxycodone UR 07/29/2021 None Detected  NONE DETECTED (Cut Off Level 100 ng/mL) Final   POC Methadone UR 07/29/2021 None Detected  NONE DETECTED (Cut Off Level 300 ng/mL) Final   POC Marijuana UR 07/29/2021 Positive (A)  NONE DETECTED (Cut Off Level 50 ng/mL) Final   SARS Coronavirus 2 Ag 07/29/2021 Negative  Negative Final   RPR Ser Ql 07/29/2021 NON REACTIVE  NON REACTIVE Final   Performed at [facility], [place] [address] [place], [place] [address]   HIV Screen 4th Generation wRfx 07/29/2021 Non Reactive  Non Reactive Final   Performed at [facility], [place] [address] [place], [place] [address]   SARSCOV2ONAVIRUS 2 AG 07/29/2021 NEGATIVE  NEGATIVE Final   Comment: (NOTE) SARS-CoV-2 antigen NOT DETECTED.   Negative results are presumptive.  Negative results do not preclude SARS-CoV-2 infection and should not be used as the sole basis for treatment or other patient management decisions, including infection  control decisions, particularly in the presence of clinical signs and  symptoms consistent with COVID-19, or in those who have been in contact with the virus.  Negative results must be combined with clinical observations, patient history, and epidemiological information. The expected result is Negative.  Fact Sheet for Patients: HandmadeRecipes.com.cy  Fact Sheet for Healthcare Providers: FuneralLife.at  This test is not yet approved or cleared by the [place]  FDA and  has been authorized for detection and/or diagnosis of SARS-CoV-2 by FDA under an Emergency Use Authorization (EUA).  This EUA will remain in effect (meaning this test can be used) for the duration of  the COV                          ID-19 declaration under Section 564(b)(1) of the Act, 21 U.S.C. section 360bbb-3(b)(1), unless the authorization is terminated or revoked sooner.    Admission on 07/06/2021, Discharged on 07/07/2021  Component Date Value Ref Range  Status   Sodium 07/06/2021 140  135 - 145 mmol/L Final   Potassium 07/06/2021 4.1  3.5 - 5.1 mmol/L Final   Chloride 07/06/2021 106  98 - 111 mmol/L Final   CO2 07/06/2021 21 (L)  22 - 32 mmol/L Final   Glucose, Bld 07/06/2021 113 (H)  70 - 99 mg/dL Final   Glucose reference range applies only to samples taken after fasting for at least 8 hours.   BUN 07/06/2021 17  8 - 23 mg/dL Final   Creatinine, Ser 07/06/2021 1.07  0.61 - 1.24 mg/dL Final   Calcium 07/06/2021 9.0  8.9 - 10.3 mg/dL Final   Total Protein 07/06/2021 6.8  6.5 - 8.1 g/dL Final   Albumin 07/06/2021 4.0  3.5 - 5.0 g/dL Final   AST 07/06/2021 21  15 - 41 U/L Final   ALT 07/06/2021 20  0 - 44 U/L Final   Alkaline Phosphatase 07/06/2021 65  38 - 126 U/L Final   Total Bilirubin 07/06/2021 0.4  0.3 - 1.2 mg/dL Final   GFR, Estimated 07/06/2021 >60  >60 mL/min Final   Comment: (NOTE) Calculated using the CKD-EPI Creatinine Equation (2021)    Anion gap 07/06/2021 13  5 - 15 Final   Performed at [facility] [address], [place], [place] [address]   Alcohol, Ethyl (B) 07/06/2021 283 (H)  <10 mg/dL Final   Comment: (NOTE) Lowest detectable limit for serum alcohol is 10 mg/dL.  For medical purposes only. Performed at [facility], [place] [address] [place], [place] [id number]    Salicylate Lvl [id number] <7.0 (L)  7.0 - 30.0 mg/dL Final   Performed at [facility] [address], [place], [place] [address]   Acetaminophen (Tylenol),  Serum 07/06/2021 <10 (L)  10 - 30 ug/mL Final   Comment: (NOTE) Therapeutic concentrations vary significantly. A range of 10-30 ug/mL  may be an effective concentration for many patients. However, some  are best treated at concentrations outside of this range. Acetaminophen concentrations >150 ug/mL at 4 hours after ingestion  and >50 ug/mL at 12 hours after ingestion are often associated with  toxic reactions.  Performed at [facility], [place] [address] [place], [place] [address]    Opiates 07/06/2021 NONE DETECTED  NONE DETECTED Final   Cocaine 07/06/2021 NONE DETECTED  NONE DETECTED Final   Benzodiazepines 07/06/2021 NONE DETECTED  NONE DETECTED Final   Amphetamines 07/06/2021 NONE DETECTED  NONE DETECTED Final   Tetrahydrocannabinol 07/06/2021 POSITIVE (A)  NONE DETECTED Final   Barbiturates 07/06/2021 NONE DETECTED  NONE DETECTED Final   Comment: (NOTE) DRUG SCREEN FOR MEDICAL PURPOSES ONLY.  IF CONFIRMATION IS NEEDED FOR ANY PURPOSE, NOTIFY LAB WITHIN 5 DAYS.  LOWEST DETECTABLE LIMITS FOR URINE DRUG SCREEN Drug Class                     Cutoff (ng/mL) Amphetamine and metabolites    1000 Barbiturate and metabolites    200 Benzodiazepine                 [id number] Tricyclics and metabolites     300 Opiates and metabolites        300 Cocaine and metabolites        300 THC                            50 Performed at [facility],  [address], [place], [place] [address]    SARS Coronavirus 2 by RT PCR 07/06/2021 NEGATIVE  NEGATIVE Final   Comment: (NOTE) SARS-CoV-2 target nucleic acids are NOT DETECTED.  The SARS-CoV-2 RNA is generally detectable in upper respiratory specimens during the acute phase of infection. The lowest concentration of SARS-CoV-2 viral copies this assay can detect is 138 copies/mL. A negative result does not preclude SARS-Cov-2 infection and should not be used as the sole basis for treatment or other patient management decisions. A negative  result may occur with  improper specimen collection/handling, submission of specimen other than nasopharyngeal swab, presence of viral mutation(s) within the areas targeted by this assay, and inadequate number of viral copies(<138 copies/mL). A negative result must be combined with clinical observations, patient history, and epidemiological information. The expected result is Negative.  Fact Sheet for Patients:  EntrepreneurPulse.com.au  Fact Sheet for Healthcare Providers:  IncredibleEmployment.be  This test is no                          t yet approved or cleared by the [place] FDA and  has been authorized for detection and/or diagnosis of SARS-CoV-2 by FDA under an Emergency Use Authorization (EUA). This EUA will remain  in effect (meaning this test can be used) for the duration of the COVID-19 declaration under Section 564(b)(1) of the Act, 21 U.S.C.section 360bbb-3(b)(1), unless the authorization is terminated  or revoked sooner.       Influenza A by PCR 07/06/2021 NEGATIVE  NEGATIVE Final   Influenza B by PCR 07/06/2021 NEGATIVE  NEGATIVE Final   Comment: (NOTE) The Xpert Xpress SARS-CoV-2/FLU/RSV plus assay is intended as an aid in the diagnosis of influenza from Nasopharyngeal swab specimens and should not be used as a sole basis for treatment. Nasal washings and aspirates are unacceptable for Xpert Xpress SARS-CoV-2/FLU/RSV testing.  Fact Sheet for Patients: EntrepreneurPulse.com.au  Fact Sheet for Healthcare Providers: IncredibleEmployment.be  This test is not yet approved or cleared by the [place] FDA and has been authorized for detection and/or diagnosis of SARS-CoV-2 by FDA under an Emergency Use Authorization (EUA). This EUA will remain in effect (meaning this test can be used) for the duration of the COVID-19 declaration under Section 564(b)(1) of the Act, 21 U.S.C. section  360bbb-3(b)(1), unless the authorization is terminated or revoked.  Performed at [facility], [place] [address], [place], [place] [address]    WBC 07/06/2021 5.7  4.0 - 10.5 K/uL Final   RBC 07/06/2021 5.37  4.22 - 5.81 MIL/uL Final   Hemoglobin 07/06/2021 15.7  13.0 - 17.0 g/dL Final   HCT 07/06/2021 48.5  39.0 - 52.0 % Final   MCV 07/06/2021 90.3  80.0 - 100.0 fL Final   MCH 07/06/2021 29.2  26.0 - 34.0 pg Final   MCHC 07/06/2021 32.4  30.0 - 36.0 g/dL Final   RDW 07/06/2021 14.5  11.5 - 15.5 % Final   Platelets 07/06/2021 307  150 - 400 K/uL Final   nRBC 07/06/2021 0.0  0.0 - 0.2 % Final   Neutrophils Relative % 07/06/2021 53  % Final   Neutro Abs 07/06/2021 3.1  1.7 - 7.7 K/uL Final   Lymphocytes Relative 07/06/2021 35  % Final   Lymphs Abs 07/06/2021 2.0  0.7 - 4.0 K/uL Final   Monocytes Relative 07/06/2021 7  % Final   Monocytes Absolute 07/06/2021 0.4  0.1 - 1.0 K/uL Final   Eosinophils  Relative 07/06/2021 3  % Final   Eosinophils Absolute 07/06/2021 0.2  0.0 - 0.5 K/uL Final   Basophils Relative 07/06/2021 1  % Final   Basophils Absolute 07/06/2021 0.0  0.0 - 0.1 K/uL Final   Immature Granulocytes 07/06/2021 1  % Final   Abs Immature Granulocytes 07/06/2021 0.06  0.00 - 0.07 K/uL Final   Performed at [facility], [address] [place], [place] [address]   Color, Urine 07/06/2021 STRAW (A)  YELLOW Final   APPearance 07/06/2021 CLEAR  CLEAR Final   Specific Gravity, Urine 07/06/2021 1.010  1.005 - 1.030 Final   pH 07/06/2021 5.0  5.0 - 8.0 Final   Glucose, UA 07/06/2021 NEGATIVE  NEGATIVE mg/dL Final   Hgb urine dipstick 07/06/2021 NEGATIVE  NEGATIVE Final   Bilirubin Urine 07/06/2021 NEGATIVE  NEGATIVE Final   Ketones, ur 07/06/2021 NEGATIVE  NEGATIVE mg/dL Final   Protein, ur 42/35/3614 NEGATIVE  NEGATIVE mg/dL Final   Nitrite 43/15/4008 NEGATIVE  NEGATIVE Final   Leukocytes,Ua 07/06/2021 NEGATIVE  NEGATIVE Final   Performed at [facility],  [address], [place], [place] [address]  Admission on 02/21/2021, Discharged on 02/22/2021  Component Date Value Ref Range Status   WBC 02/21/2021 6.5  4.0 - 10.5 K/uL Final   RBC 02/21/2021 4.61  4.22 - 5.81 MIL/uL Final   Hemoglobin 02/21/2021 13.3  13.0 - 17.0 g/dL Final   HCT 50/93/2671 40.4  39.0 - 52.0 % Final   MCV 02/21/2021 87.6  80.0 - 100.0 fL Final   MCH 02/21/2021 28.9  26.0 - 34.0 pg Final   MCHC 02/21/2021 32.9  30.0 - 36.0 g/dL Final   RDW 24/58/0998 12.7  11.5 - 15.5 % Final   Platelets 02/21/2021 605 (H)  150 - 400 K/uL Final   nRBC 02/21/2021 0.0  0.0 - 0.2 % Final   Neutrophils Relative % 02/21/2021 49  % Final   Neutro Abs 02/21/2021 3.2  1.7 - 7.7 K/uL Final   Lymphocytes Relative 02/21/2021 43  % Final   Lymphs Abs 02/21/2021 2.8  0.7 - 4.0 K/uL Final   Monocytes Relative 02/21/2021 6  % Final   Monocytes Absolute 02/21/2021 0.4  0.1 - 1.0 K/uL Final   Eosinophils Relative 02/21/2021 1  % Final   Eosinophils Absolute 02/21/2021 0.1  0.0 - 0.5 K/uL Final   Basophils Relative 02/21/2021 0  % Final   Basophils Absolute 02/21/2021 0.0  0.0 - 0.1 K/uL Final   Immature Granulocytes 02/21/2021 1  % Final   Abs Immature Granulocytes 02/21/2021 0.04  0.00 - 0.07 K/uL Final   Performed at [facility], [address] [place], [place] [address]   Sodium 02/21/2021 140  135 - 145 mmol/L Final   Potassium 02/21/2021 3.2 (L)  3.5 - 5.1 mmol/L Final   Chloride 02/21/2021 105  98 - 111 mmol/L Final   CO2 02/21/2021 22  22 - 32 mmol/L Final   Glucose, Bld 02/21/2021 102 (H)  70 - 99 mg/dL Final   Glucose reference range applies only to samples taken after fasting for at least 8 hours.   BUN 02/21/2021 10  8 - 23 mg/dL Final   Creatinine, Ser 02/21/2021 0.80  0.61 - 1.24 mg/dL Final   Calcium 05/39/7673 9.5  8.9 - 10.3 mg/dL Final   GFR, Estimated 02/21/2021 >60  >60 mL/min Final   Comment: (NOTE) Calculated using the CKD-EPI Creatinine Equation (2021)     Anion  gap 02/21/2021 13  5 - 15 Final   Performed at [facility], [place] [address] [place], [place] [address]   Specimen Description 02/22/2021    Final                   Value:JOINT FLUID RIGHT ELBOW Performed at [facility], [place] [address] [place], [place] [address]    Special Requests 02/22/2021    Final                   Value:Normal Performed at [facility], [place] [address] [place], [place] [address]    Gram Stain 02/22/2021    Final                   Value:RARE WBC PRESENT,BOTH PMN AND MONONUCLEAR NO ORGANISMS SEEN    Culture 02/22/2021    Final                   Value:NO GROWTH 3 DAYS Performed at [facility], [place] [address], [place], [place] [address]    Report Status 02/22/2021 02/25/2021 FINAL   Final   Color, Synovial 02/22/2021 RED (A)  YELLOW Final   Appearance-Synovial 02/22/2021 TURBID (A)  CLEAR Final   Crystals, Fluid 02/22/2021 EXTRACELLULAR MONOSODIUM URATE CRYSTALS   Final   WBC, Synovial 02/22/2021 7,070 (H)  0 - 200 /cu mm Final   Neutrophil, Synovial 02/22/2021 55 (H)  0 - 25 % Final   Lymphocytes-Synovial Fld 02/22/2021 6  0 - 20 % Final   Monocyte-Macrophage-Synovial Fluid 02/22/2021 39 (L)  50 - 90 % Final   Eosinophils-Synovial 02/22/2021 0  0 - 1 % Final   Other Cells-SYN 02/22/2021 OTHER CELLS IDENTIFIED AS SYNOVIAL LINING CELLS   Final   Performed at [facility], [place] [address], [place],  [address]  Admission on 02/09/2021, Discharged on 02/10/2021  Component Date Value Ref Range Status   Lactic Acid, Venous 02/10/2021 1.1  0.5 - 1.9 mmol/L Final   Performed at [facility], [place] [address] [place], [place] [address]   Lactic Acid, Venous 02/09/2021 0.8  0.5 - 1.9 mmol/L Final   Performed at [facility] [address], [place], [place] [address]   Sodium 02/09/2021 136  135 - 145 mmol/L Final   Potassium 02/09/2021 3.2  (L)  3.5 - 5.1 mmol/L Final   Chloride 02/09/2021 98  98 - 111 mmol/L Final   CO2 02/09/2021 27  22 - 32 mmol/L Final   Glucose, Bld 02/09/2021 103 (H)  70 - 99 mg/dL Final   Glucose reference range applies only to samples taken after fasting for at least 8 hours.   BUN 02/09/2021 12  8 - 23 mg/dL Final   Creatinine, Ser 02/09/2021 0.81  0.61 - 1.24 mg/dL Final   Calcium 02/09/2021 9.1  8.9 - 10.3 mg/dL Final   Total Protein 02/09/2021 7.3  6.5 - 8.1 g/dL Final   Albumin 02/09/2021 3.4 (L)  3.5 - 5.0 g/dL Final   AST 02/09/2021 13 (L)  15 - 41 U/L Final   ALT 02/09/2021 13  0 - 44 U/L Final   Alkaline Phosphatase 02/09/2021 60  38 - 126 U/L Final   Total Bilirubin 02/09/2021 1.1  0.3 - 1.2 mg/dL Final   GFR, Estimated 02/09/2021 >60  >60 mL/min Final   Comment: (NOTE) Calculated using the CKD-EPI Creatinine Equation (2021)  Anion gap 02/09/2021 11  5 - 15 Final   Performed at [facility] [address], [place], [place] [address]   WBC 02/09/2021 12.8 (H)  4.0 - 10.5 K/uL Final   RBC 02/09/2021 4.31  4.22 - 5.81 MIL/uL Final   Hemoglobin 02/09/2021 12.7 (L)  13.0 - 17.0 g/dL Final   HCT 02/09/2021 37.8 (L)  39.0 - 52.0 % Final   MCV 02/09/2021 87.7  80.0 - 100.0 fL Final   MCH 02/09/2021 29.5  26.0 - 34.0 pg Final   MCHC 02/09/2021 33.6  30.0 - 36.0 g/dL Final   RDW 02/09/2021 12.3  11.5 - 15.5 % Final   Platelets 02/09/2021 368  150 - 400 K/uL Final   nRBC 02/09/2021 0.0  0.0 - 0.2 % Final   Neutrophils Relative % 02/09/2021 75  % Final   Neutro Abs 02/09/2021 9.6 (H)  1.7 - 7.7 K/uL Final   Lymphocytes Relative 02/09/2021 16  % Final   Lymphs Abs 02/09/2021 2.1  0.7 - 4.0 K/uL Final   Monocytes Relative 02/09/2021 8  % Final   Monocytes Absolute 02/09/2021 1.0  0.1 - 1.0 K/uL Final   Eosinophils Relative 02/09/2021 0  % Final   Eosinophils Absolute 02/09/2021 0.0  0.0 - 0.5 K/uL Final   Basophils Relative 02/09/2021 0  % Final   Basophils Absolute  02/09/2021 0.0  0.0 - 0.1 K/uL Final   Immature Granulocytes 02/09/2021 1  % Final   Abs Immature Granulocytes 02/09/2021 0.09 (H)  0.00 - 0.07 K/uL Final   Performed at [facility], [place] [address] [place], [place] [address]   Color, Urine 02/10/2021 YELLOW  YELLOW Final   APPearance 02/10/2021 CLEAR  CLEAR Final   Specific Gravity, Urine 02/10/2021 1.015  1.005 - 1.030 Final   pH 02/10/2021 6.0  5.0 - 8.0 Final   Glucose, UA 02/10/2021 NEGATIVE  NEGATIVE mg/dL Final   Hgb urine dipstick 02/10/2021 NEGATIVE  NEGATIVE Final   Bilirubin Urine 02/10/2021 NEGATIVE  NEGATIVE Final   Ketones, ur 02/10/2021 20 (A)  NEGATIVE mg/dL Final   Protein, ur 02/10/2021 NEGATIVE  NEGATIVE mg/dL Final   Nitrite 02/10/2021 NEGATIVE  NEGATIVE Final   Leukocytes,Ua 02/10/2021 NEGATIVE  NEGATIVE Final   Performed at [facility] [address] [place], [place] [address]   Specimen Description 02/09/2021    Final                   Value:BLOOD LEFT ANTECUBITAL Performed at [facility] [address] [place], [place] [address]    Special Requests 02/09/2021    Final                   Value:BOTTLES DRAWN AEROBIC AND ANAEROBIC Blood Culture adequate volume Performed at [facility] [address] [place], [place] [address]    Culture 02/09/2021    Final                   Value:NO GROWTH 5 DAYS Performed at [facility] [address], [place], [place] [address]    Report Status 02/09/2021 02/15/2021 FINAL   Final   Specimen Description 02/09/2021    Final                   Value:BLOOD LEFT ANTECUBITAL Performed at [facility], [place] [address] [place], [place] [address]    Special Requests 02/09/2021  Final                   Value:BOTTLES DRAWN AEROBIC AND ANAEROBIC Blood Culture adequate volume Performed at [facility] [address], [place], [place] [address]    Culture  02/09/2021    Final                   Value:NO GROWTH 5 DAYS Performed at [facility] [address], [place], [place] [address]    Report Status 02/09/2021 02/15/2021 FINAL   Final   Sed Rate 02/09/2021 89 (H)  0 - 16 mm/hr Final   Performed at [facility], [place] [address], [place], [place] [address]   CRP 02/09/2021 14.7 (H)  <1.0 mg/dL Final   Performed at [facility] [address], [place], [place] [address]    Allergies: Patient has no known allergies.  PTA Medications: (Not in a hospital admission)   Medical Decision Making  Admitted to [facility] for safety and stabilization  Labs reviewed:  See above  Medication Management Meds ordered this encounter  Medications   DISCONTD: acetaminophen (TYLENOL) tablet 650 mg   alum & mag hydroxide-simeth (MAALOX/MYLANTA) 200-200-20 MG/5ML suspension 30 mL   magnesium hydroxide (MILK OF MAGNESIA) suspension 30 mL   amLODipine (NORVASC) tablet 5 mg   gabapentin (NEURONTIN) capsule 200 mg   indomethacin (INDOCIN) capsule 25 mg   sertraline (ZOLOFT) tablet 50 mg   traZODone (DESYREL) tablet 50 mg   hydrOXYzine (ATARAX/VISTARIL) tablet 25 mg   loperamide (IMODIUM) capsule 2-4 mg   ondansetron (ZOFRAN-ODT) disintegrating tablet 4 mg       Recommendations  Based on my evaluation the patient does not appear to have an emergency medical condition.  Eaton Folmar, NP 08/05/21  3:25 PM

## 2021-08-05 NOTE — Progress Notes (Signed)
Patient is awake and alert on unit.  He took a shower and is now watching TV.  He remains quite anxious with periodic tremors.  He is calm and pleasant.  Makes needs known and is without signs or symptoms of etoh withdrawal.  Patient encouraged to seek out staff if overwhelmed by thoughts or feelings.

## 2021-08-05 NOTE — ED Notes (Signed)
Medicated per order for anxiety and shakes per order Baron Hamper. NP

## 2021-08-05 NOTE — Clinical Social Work Psych Note (Addendum)
CSW Initial Note (Update)   Willie Collins was discharged from the [facility]  Monday morning, with plans to receive treatment at [facility]. Patient reports he was going through admission process at [facility] and after they checked his insurance (Medicaid) they told him he could not be treated there due to [place].  Willie Collins continues to be motivated for treatment.   Willie Collins was referred to [facility] for alternative placement.    Willie Collins was accepted to [facility] for residential treatment services, however there are no beds available at this time. [facility] recommends patient to contact their facility daily to determine bed availability.   Willie Collins reports that he continues to worry about not being accepted for treatment prior to discharging from [facility].   He denied having any SI, HI or AVH at this time.   CSW attempted to contact the patient's [employer], Willie Collins [phone]); however there was no answer. CSW left a HIPAA compliant voicemail.    CSW will continue to follow for possible placement.     Willie Collins, MSW, LCSW Clinical Child psychotherapist (Facility Based Crisis) [facility]

## 2021-08-05 NOTE — ED Notes (Signed)
Pt is in the bed sleeping. Respirations are even and unlabored. No acute distress noted. Will continue to monitor for safety. 

## 2021-08-05 NOTE — ED Provider Notes (Signed)
Behavioral Health Progress Note  Date and Time: 08/05/2021 10:05 AM Name: Willie Collins MRN:  [id number]  Subjective:  "[facility] said they got some beds but now they want my probation office to send in referral; what do I needed to do."  Willie Collins, 60 y.o., male admitted to [facility] continuous assessment unit after a brief discharge from facility based crisis unit.  Patient was accepted to [facility] in [facility] but once there he was declined stating that his Medicaid was [facility] and not [facility]. [facility] provided transportation back to [facility] where patient presented with passive suicidal ideation related to not being able to get into a program to help with alcohol use disorder.   Willie Collins seen face to face for psychiatric reassessment by this provider , consulted with Dr. Ernie Hew; and chart reviewed on 08/05/21.  On evaluation Willie Collins reports he has spoken to [facility] and was informed that had male beds available but would need the referral to come from his probation officer instead of facility social work.  Patient spoke to his probation officer Olivia Canter at [phone]) and was informed that she was unaware what needed to be done because staff that usually handles out for holiday.  Stated she would call to see if verbal referral would be enough.  Patient states every time he gets his hopes up something happens to dampen them.  Reported some passive suicidal ideation that is more conditional of getting help with his alcohol use disorder.  Reports he is tolerating his medications without adverse reaction, sleeping and eating without difficulty.  Denies homicidal ideation, psychosis, and paranoia During evaluation Willie Collins is sitting upright in chair in no acute distress.  He is alert, oriented x 4, calm, cooperative and attentive.  His mood is anxious but somewhat euthymic with congruent affect.  He has normal speech, and  behavior.  Objectively there is no evidence of psychosis/mania or delusional thinking.  Patient is able to converse coherently, goal directed thoughts, no distractibility, or pre-occupation.  He also denies homicidal ideation, psychosis, and paranoia; but endorses passive suicidal ideation that is conditional with no intent or plan voiced.  Patient answered question appropriately.     Diagnosis:  Final diagnoses:  Passive suicidal ideations  Substance induced mood disorder (HCC)  Adjustment disorder with mixed anxiety and depressed mood  Severe episode of recurrent major depressive disorder, without psychotic features ([place])    Total Time spent with patient: 20 minutes  Past Psychiatric History:  Major depressive disorder major and alcohol use disorder Past Medical History:  Past Medical History:  Diagnosis Date   Hypertension     Past Surgical History:  Procedure Laterality Date   IRRIGATION AND DEBRIDEMENT FOOT Left 11/25/2020   Procedure: IRRIGATION AND DEBRIDEMENT FOOT;  Surgeon: Felipa Furnace, DPM;  Location: [facility];  Service: Podiatry;  Laterality: Left;   Family History: History reviewed. No pertinent family history. Family Psychiatric  History: None noted Social History:  Social History   Substance and Sexual Activity  Alcohol Use Yes   Comment: weekends     Social History   Substance and Sexual Activity  Drug Use Never    Social History   Socioeconomic History   Marital status: Single    Spouse name: Not on file   Number of children: Not on file   Years of education: Not on file   Highest education level: Not on file  Occupational History   Not on file  Tobacco Use   Smoking status: Never   Smokeless tobacco: Never  Vaping Use   Vaping Use: Never used  Substance and Sexual Activity   Alcohol use: Yes    Comment: weekends   Drug use: Never   Sexual activity: Yes    Partners: Female  Other Topics Concern   Not on file  Social History Narrative   Not  on file   Social Determinants of Health   Financial Resource Strain: Not on file  Food Insecurity: Not on file  Transportation Needs: Not on file  Physical Activity: Not on file  Stress: Not on file  Social Connections: Not on file   SDOH:  SDOH Screenings   Alcohol Screen: Not on file  Depression (PHQ2-9): Not on file  Financial Resource Strain: Not on file  Food Insecurity: Not on file  Housing: Not on file  Physical Activity: Not on file  Social Connections: Not on file  Stress: Not on file  Tobacco Use: Low Risk    Smoking Tobacco Use: Never   Smokeless Tobacco Use: Never   Passive Exposure: Not on file  Transportation Needs: Not on file   Additional Social History:    Sleep: Good  Appetite:  Good  Current Medications:  Current Facility-Administered Medications  Medication Dose Route Frequency Provider Last Rate Last Admin   alum & mag hydroxide-simeth (MAALOX/MYLANTA) 200-200-20 MG/5ML suspension 30 mL  30 mL Oral Q4H PRN Mandy Fitzwater B, NP       amLODipine (NORVASC) tablet 5 mg  5 mg Oral Daily Ja Pistole B, NP   5 mg at 08/05/21 [id number]   gabapentin (NEURONTIN) capsule 200 mg  200 mg Oral TID Choya Tornow B, NP   200 mg at 08/05/21 [id number]   hydrOXYzine (ATARAX/VISTARIL) tablet 25 mg  25 mg Oral TID PRN Rozetta Nunnery, NP   25 mg at 08/05/21 [id number]   indomethacin (INDOCIN) capsule 25 mg  25 mg Oral TID PRN Dannie Woolen B, NP       loperamide (IMODIUM) capsule 2-4 mg  2-4 mg Oral PRN Lindon Romp A, NP       magnesium hydroxide (MILK OF MAGNESIA) suspension 30 mL  30 mL Oral Daily PRN Harlan Ervine B, NP       ondansetron (ZOFRAN-ODT) disintegrating tablet 4 mg  4 mg Oral Q6H PRN Lindon Romp A, NP       sertraline (ZOLOFT) tablet 50 mg  50 mg Oral Daily Aarion Metzgar B, NP   50 mg at 08/05/21 0903   traZODone (DESYREL) tablet 50 mg  50 mg Oral QHS PRN Aviance Cooperwood B, NP   50 mg at 08/04/21 2326   Current Outpatient Medications  Medication Sig Dispense  Refill   amLODipine (NORVASC) 5 MG tablet Take 1 tablet (5 mg total) by mouth daily. 14 tablet 1   gabapentin (NEURONTIN) 100 MG capsule Take 2 capsules (200 mg total) by mouth 3 (three) times daily. 180 capsule 1   indomethacin (INDOCIN) 25 MG capsule Take 1 capsule (25 mg total) by mouth 3 (three) times daily as needed for moderate pain or mild pain (gout pain). 90 capsule 1   sertraline (ZOLOFT) 50 MG tablet Take 1 tablet (50 mg total) by mouth daily. 30 tablet 1    Labs  Lab Results:  Admission on 08/04/2021  Component Date Value Ref Range Status   SARS Coronavirus 2 by RT PCR 08/04/2021 NEGATIVE  NEGATIVE Final   Comment: (NOTE) SARS-CoV-2 target nucleic  acids are NOT DETECTED.  The SARS-CoV-2 RNA is generally detectable in upper respiratory specimens during the acute phase of infection. The lowest concentration of SARS-CoV-2 viral copies this assay can detect is 138 copies/mL. A negative result does not preclude SARS-Cov-2 infection and should not be used as the sole basis for treatment or other patient management decisions. A negative result may occur with  improper specimen collection/handling, submission of specimen other than nasopharyngeal swab, presence of viral mutation(s) within the areas targeted by this assay, and inadequate number of viral copies(<138 copies/mL). A negative result must be combined with clinical observations, patient history, and epidemiological information. The expected result is Negative.  Fact Sheet for Patients:  EntrepreneurPulse.com.au  Fact Sheet for Healthcare Providers:  IncredibleEmployment.be  This test is no                          t yet approved or cleared by the [place] FDA and  has been authorized for detection and/or diagnosis of SARS-CoV-2 by FDA under an Emergency Use Authorization (EUA). This EUA will remain  in effect (meaning this test can be used) for the duration of the COVID-19  declaration under Section 564(b)(1) of the Act, 21 U.S.C.section 360bbb-3(b)(1), unless the authorization is terminated  or revoked sooner.       Influenza A by PCR 08/04/2021 NEGATIVE  NEGATIVE Final   Influenza B by PCR 08/04/2021 NEGATIVE  NEGATIVE Final   Comment: (NOTE) The Xpert Xpress SARS-CoV-2/FLU/RSV plus assay is intended as an aid in the diagnosis of influenza from Nasopharyngeal swab specimens and should not be used as a sole basis for treatment. Nasal washings and aspirates are unacceptable for Xpert Xpress SARS-CoV-2/FLU/RSV testing.  Fact Sheet for Patients: EntrepreneurPulse.com.au  Fact Sheet for Healthcare Providers: IncredibleEmployment.be  This test is not yet approved or cleared by the [place] FDA and has been authorized for detection and/or diagnosis of SARS-CoV-2 by FDA under an Emergency Use Authorization (EUA). This EUA will remain in effect (meaning this test can be used) for the duration of the COVID-19 declaration under Section 564(b)(1) of the Act, 21 U.S.C. section 360bbb-3(b)(1), unless the authorization is terminated or revoked.  Performed at [facility], [place] [address] [place], [place] [address]    SARS Coronavirus 2 Ag 08/04/2021 Negative  Negative Final   POC Amphetamine UR 08/04/2021 None Detected  NONE DETECTED (Cut Off Level 1000 ng/mL) Final   POC Secobarbital (BAR) 08/04/2021 None Detected  NONE DETECTED (Cut Off Level 300 ng/mL) Final   POC Buprenorphine (BUP) 08/04/2021 None Detected  NONE DETECTED (Cut Off Level 10 ng/mL) Final   POC Oxazepam (BZO) 08/04/2021 None Detected  NONE DETECTED (Cut Off Level 300 ng/mL) Final   POC Cocaine UR 08/04/2021 None Detected  NONE DETECTED (Cut Off Level 300 ng/mL) Final   POC Methamphetamine UR 08/04/2021 None Detected  NONE DETECTED (Cut Off Level 1000 ng/mL) Final   POC Morphine 08/04/2021 None Detected  NONE DETECTED (Cut Off Level 300 ng/mL)  Final   POC Oxycodone UR 08/04/2021 None Detected  NONE DETECTED (Cut Off Level 100 ng/mL) Final   POC Methadone UR 08/04/2021 None Detected  NONE DETECTED (Cut Off Level 300 ng/mL) Final   POC Marijuana UR 08/04/2021 Positive (A)  NONE DETECTED (Cut Off Level 50 ng/mL) Final   Alcohol, Ethyl (B) 08/04/2021 <10  <10 mg/dL Final   Comment: (NOTE) Lowest detectable limit for serum alcohol is 10 mg/dL.  For medical purposes only. Performed at [facility], [place] [address] [place], [place] [address]    SARSCOV2ONAVIRUS 2 AG 08/04/2021 NEGATIVE  NEGATIVE Final   Comment: (NOTE) SARS-CoV-2 antigen NOT DETECTED.   Negative results are presumptive.  Negative results do not preclude SARS-CoV-2 infection and should not be used as the sole basis for treatment or other patient management decisions, including infection  control decisions, particularly in the presence of clinical signs and  symptoms consistent with COVID-19, or in those who have been in contact with the virus.  Negative results must be combined with clinical observations, patient history, and epidemiological information. The expected result is Negative.  Fact Sheet for Patients: HandmadeRecipes.com.cy  Fact Sheet for Healthcare Providers: FuneralLife.at  This test is not yet approved or cleared by the [place] FDA and  has been authorized for detection and/or diagnosis of SARS-CoV-2 by FDA under an Emergency Use Authorization (EUA).  This EUA will remain in effect (meaning this test can be used) for the duration of  the COV                          ID-19 declaration under Section 564(b)(1) of the Act, 21 U.S.C. section 360bbb-3(b)(1), unless the authorization is terminated or revoked sooner.    Admission on 07/29/2021, Discharged on 08/04/2021  Component Date Value Ref Range Status   SARS Coronavirus 2 by RT PCR 07/29/2021 NEGATIVE  NEGATIVE Final   Comment:  (NOTE) SARS-CoV-2 target nucleic acids are NOT DETECTED.  The SARS-CoV-2 RNA is generally detectable in upper respiratory specimens during the acute phase of infection. The lowest concentration of SARS-CoV-2 viral copies this assay can detect is 138 copies/mL. A negative result does not preclude SARS-Cov-2 infection and should not be used as the sole basis for treatment or other patient management decisions. A negative result may occur with  improper specimen collection/handling, submission of specimen other than nasopharyngeal swab, presence of viral mutation(s) within the areas targeted by this assay, and inadequate number of viral copies(<138 copies/mL). A negative result must be combined with clinical observations, patient history, and epidemiological information. The expected result is Negative.  Fact Sheet for Patients:  EntrepreneurPulse.com.au  Fact Sheet for Healthcare Providers:  IncredibleEmployment.be  This test is no                          t yet approved or cleared by the [place] FDA and  has been authorized for detection and/or diagnosis of SARS-CoV-2 by FDA under an Emergency Use Authorization (EUA). This EUA will remain  in effect (meaning this test can be used) for the duration of the COVID-19 declaration under Section 564(b)(1) of the Act, 21 U.S.C.section 360bbb-3(b)(1), unless the authorization is terminated  or revoked sooner.       Influenza A by PCR 07/29/2021 NEGATIVE  NEGATIVE Final   Influenza B by PCR 07/29/2021 NEGATIVE  NEGATIVE Final   Comment: (NOTE) The Xpert Xpress SARS-CoV-2/FLU/RSV plus assay is intended as an aid in the diagnosis of influenza from Nasopharyngeal swab specimens and should not be used as a sole basis for treatment. Nasal washings and aspirates are unacceptable for Xpert Xpress SARS-CoV-2/FLU/RSV testing.  Fact Sheet for Patients: EntrepreneurPulse.com.au  Fact  Sheet for Healthcare Providers: IncredibleEmployment.be  This test is not yet approved or cleared by the [place] FDA and has been authorized for detection and/or diagnosis of SARS-CoV-2 by FDA under  an Emergency Use Authorization (EUA). This EUA will remain in effect (meaning this test can be used) for the duration of the COVID-19 declaration under Section 564(b)(1) of the Act, 21 U.S.C. section 360bbb-3(b)(1), unless the authorization is terminated or revoked.  Performed at [facility], [place] [address], [place], [place] [address]    WBC 07/29/2021 4.8  4.0 - 10.5 K/uL Final   RBC 07/29/2021 5.45  4.22 - 5.81 MIL/uL Final   Hemoglobin 07/29/2021 16.5  13.0 - 17.0 g/dL Final   HCT 07/29/2021 48.6  39.0 - 52.0 % Final   MCV 07/29/2021 89.2  80.0 - 100.0 fL Final   MCH 07/29/2021 30.3  26.0 - 34.0 pg Final   MCHC 07/29/2021 34.0  30.0 - 36.0 g/dL Final   RDW 07/29/2021 15.0  11.5 - 15.5 % Final   Platelets 07/29/2021 289  150 - 400 K/uL Final   nRBC 07/29/2021 0.0  0.0 - 0.2 % Final   Neutrophils Relative % 07/29/2021 52  % Final   Neutro Abs 07/29/2021 2.5  1.7 - 7.7 K/uL Final   Lymphocytes Relative 07/29/2021 40  % Final   Lymphs Abs 07/29/2021 2.0  0.7 - 4.0 K/uL Final   Monocytes Relative 07/29/2021 6  % Final   Monocytes Absolute 07/29/2021 0.3  0.1 - 1.0 K/uL Final   Eosinophils Relative 07/29/2021 2  % Final   Eosinophils Absolute 07/29/2021 0.1  0.0 - 0.5 K/uL Final   Basophils Relative 07/29/2021 0  % Final   Basophils Absolute 07/29/2021 0.0  0.0 - 0.1 K/uL Final   Immature Granulocytes 07/29/2021 0  % Final   Abs Immature Granulocytes 07/29/2021 0.02  0.00 - 0.07 K/uL Final   Performed at [facility], [place] [address], [place], [place] [address]   Sodium 07/29/2021 139  135 - 145 mmol/L Final   Potassium 07/29/2021 3.8  3.5 - 5.1 mmol/L Final   Chloride 07/29/2021 99  98 - 111 mmol/L Final   CO2 07/29/2021 25  22 - 32 mmol/L Final    Glucose, Bld 07/29/2021 74  70 - 99 mg/dL Final   Glucose reference range applies only to samples taken after fasting for at least 8 hours.   BUN 07/29/2021 8  8 - 23 mg/dL Final   Creatinine, Ser 07/29/2021 0.75  0.61 - 1.24 mg/dL Final   Calcium 07/29/2021 9.2  8.9 - 10.3 mg/dL Final   Total Protein 07/29/2021 7.1  6.5 - 8.1 g/dL Final   Albumin 07/29/2021 4.2  3.5 - 5.0 g/dL Final   AST 07/29/2021 55 (H)  15 - 41 U/L Final   ALT 07/29/2021 50 (H)  0 - 44 U/L Final   Alkaline Phosphatase 07/29/2021 61  38 - 126 U/L Final   Total Bilirubin 07/29/2021 1.1  0.3 - 1.2 mg/dL Final   GFR, Estimated 07/29/2021 >60  >60 mL/min Final   Comment: (NOTE) Calculated using the CKD-EPI Creatinine Equation (2021)    Anion gap 07/29/2021 15  5 - 15 Final   Performed at [place] [address], [place], [place] [address]   Hgb A1c MFr Bld 07/29/2021 5.4  4.8 - 5.6 % Final   Comment: (NOTE) Pre diabetes:          5.7%-6.4%  Diabetes:              >6.4%  Glycemic control for   <7.0% adults with diabetes    Mean Plasma Glucose 07/29/2021 108.28  mg/dL Final   Performed at [facility], [place] [address], [place], [place] [address]   Magnesium 07/29/2021 1.6 (L)  1.7 - 2.4 mg/dL Final   Performed at [place] [address] [place], [place] [address]   Alcohol, Ethyl (B) 07/29/2021 277 (H)  <10 mg/dL Final   Comment: (NOTE) Lowest detectable limit for serum alcohol is 10 mg/dL.  For medical purposes only. Performed at [facility], [place] [address], [place], [place] [address]    Cholesterol 07/29/2021 259 (H)  0 - 200 mg/dL Final   Triglycerides 07/29/2021 62  <150 mg/dL Final   HDL 07/29/2021 >135  >40 mg/dL Final   Total CHOL/HDL Ratio 07/29/2021 NOT CALCULATED  RATIO Final   VLDL 07/29/2021 12  0 - 40 mg/dL Final   LDL Cholesterol 07/29/2021 NOT CALCULATED  0 - 99 mg/dL Final   Performed at [facility], [place] [address] [place], [place] [address]   TSH  07/29/2021 0.545  0.350 - 4.500 uIU/mL Final   Comment: Performed by a 3rd Generation assay with a functional sensitivity of <=0.01 uIU/mL. Performed at [facility], [place] [address] [place], [place] [address]    Color, Urine 07/29/2021 YELLOW  YELLOW Final   APPearance 07/29/2021 CLEAR  CLEAR Final   Specific Gravity, Urine 07/29/2021 1.005  1.005 - 1.030 Final   pH 07/29/2021 5.0  5.0 - 8.0 Final   Glucose, UA 07/29/2021 NEGATIVE  NEGATIVE mg/dL Final   Hgb urine dipstick 07/29/2021 NEGATIVE  NEGATIVE Final   Bilirubin Urine 07/29/2021 NEGATIVE  NEGATIVE Final   Ketones, ur 07/29/2021 NEGATIVE  NEGATIVE mg/dL Final   Protein, ur 07/29/2021 NEGATIVE  NEGATIVE mg/dL Final   Nitrite 07/29/2021 NEGATIVE  NEGATIVE Final   Leukocytes,Ua 07/29/2021 NEGATIVE  NEGATIVE Final   WBC, UA 07/29/2021 0-5  0 - 5 WBC/hpf Final   Bacteria, UA 07/29/2021 NONE SEEN  NONE SEEN Final   Performed at [facility], [place] [address] [place], [place] [address]   POC Amphetamine UR 07/29/2021 None Detected  NONE DETECTED (Cut Off Level 1000 ng/mL) Final   POC Secobarbital (BAR) 07/29/2021 None Detected  NONE DETECTED (Cut Off Level 300 ng/mL) Final   POC Buprenorphine (BUP) 07/29/2021 None Detected  NONE DETECTED (Cut Off Level 10 ng/mL) Final   POC Oxazepam (BZO) 07/29/2021 None Detected  NONE DETECTED (Cut Off Level 300 ng/mL) Final   POC Cocaine UR 07/29/2021 None Detected  NONE DETECTED (Cut Off Level 300 ng/mL) Final   POC Methamphetamine UR 07/29/2021 None Detected  NONE DETECTED (Cut Off Level 1000 ng/mL) Final   POC Morphine 07/29/2021 None Detected  NONE DETECTED (Cut Off Level 300 ng/mL) Final   POC Oxycodone UR 07/29/2021 None Detected  NONE DETECTED (Cut Off Level 100 ng/mL) Final   POC Methadone UR 07/29/2021 None Detected  NONE DETECTED (Cut Off Level 300 ng/mL) Final   POC Marijuana UR 07/29/2021 Positive (A)  NONE DETECTED (Cut Off Level 50 ng/mL) Final   SARS Coronavirus 2 Ag  07/29/2021 Negative  Negative Final   RPR Ser Ql 07/29/2021 NON REACTIVE  NON REACTIVE Final   Performed at [facility], [place] [address], [place], [place] [address]   HIV Screen 4th Generation wRfx 07/29/2021 Non Reactive  Non Reactive Final   Performed at [facility], [place] [address], [place],  [address]   SARSCOV2ONAVIRUS 2 AG 07/29/2021 NEGATIVE  NEGATIVE Final  Comment: (NOTE) SARS-CoV-2 antigen NOT DETECTED.   Negative results are presumptive.  Negative results do not preclude SARS-CoV-2 infection and should not be used as the sole basis for treatment or other patient management decisions, including infection  control decisions, particularly in the presence of clinical signs and  symptoms consistent with COVID-19, or in those who have been in contact with the virus.  Negative results must be combined with clinical observations, patient history, and epidemiological information. The expected result is Negative.  Fact Sheet for Patients: HandmadeRecipes.com.cy  Fact Sheet for Healthcare Providers: FuneralLife.at  This test is not yet approved or cleared by the [place] FDA and  has been authorized for detection and/or diagnosis of SARS-CoV-2 by FDA under an Emergency Use Authorization (EUA).  This EUA will remain in effect (meaning this test can be used) for the duration of  the COV                          ID-19 declaration under Section 564(b)(1) of the Act, 21 U.S.C. section 360bbb-3(b)(1), unless the authorization is terminated or revoked sooner.    Admission on 07/06/2021, Discharged on 07/07/2021  Component Date Value Ref Range Status   Sodium 07/06/2021 140  135 - 145 mmol/L Final   Potassium 07/06/2021 4.1  3.5 - 5.1 mmol/L Final   Chloride 07/06/2021 106  98 - 111 mmol/L Final   CO2 07/06/2021 21 (L)  22 - 32 mmol/L Final   Glucose, Bld 07/06/2021 113 (H)  70 - 99 mg/dL Final   Glucose reference  range applies only to samples taken after fasting for at least 8 hours.   BUN 07/06/2021 17  8 - 23 mg/dL Final   Creatinine, Ser 07/06/2021 1.07  0.61 - 1.24 mg/dL Final   Calcium 07/06/2021 9.0  8.9 - 10.3 mg/dL Final   Total Protein 07/06/2021 6.8  6.5 - 8.1 g/dL Final   Albumin 07/06/2021 4.0  3.5 - 5.0 g/dL Final   AST 07/06/2021 21  15 - 41 U/L Final   ALT 07/06/2021 20  0 - 44 U/L Final   Alkaline Phosphatase 07/06/2021 65  38 - 126 U/L Final   Total Bilirubin 07/06/2021 0.4  0.3 - 1.2 mg/dL Final   GFR, Estimated 07/06/2021 >60  >60 mL/min Final   Comment: (NOTE) Calculated using the CKD-EPI Creatinine Equation (2021)    Anion gap 07/06/2021 13  5 - 15 Final   Performed at [place] [address], [place], [place] [address]   Alcohol, Ethyl (B) 07/06/2021 283 (H)  <10 mg/dL Final   Comment: (NOTE) Lowest detectable limit for serum alcohol is 10 mg/dL.  For medical purposes only. Performed at [facility], [place] [address], [place], [place] [id number]    Salicylate Lvl [id number] <7.0 (L)  7.0 - 30.0 mg/dL Final   Performed at [place] [address], [place], [place] [address]   Acetaminophen (Tylenol), Serum 07/06/2021 <10 (L)  10 - 30 ug/mL Final   Comment: (NOTE) Therapeutic concentrations vary significantly. A range of 10-30 ug/mL  may be an effective concentration for many patients. However, some  are best treated at concentrations outside of this range. Acetaminophen concentrations >150 ug/mL at 4 hours after ingestion  and >50 ug/mL at 12 hours after ingestion are often associated with  toxic reactions.  Performed at [facility], [place] [address] [place], [place] [address]  Opiates 07/06/2021 NONE DETECTED  NONE DETECTED Final   Cocaine 07/06/2021 NONE DETECTED  NONE DETECTED Final   Benzodiazepines 07/06/2021 NONE DETECTED  NONE DETECTED Final   Amphetamines 07/06/2021 NONE DETECTED  NONE DETECTED Final   Tetrahydrocannabinol  07/06/2021 POSITIVE (A)  NONE DETECTED Final   Barbiturates 07/06/2021 NONE DETECTED  NONE DETECTED Final   Comment: (NOTE) DRUG SCREEN FOR MEDICAL PURPOSES ONLY.  IF CONFIRMATION IS NEEDED FOR ANY PURPOSE, NOTIFY LAB WITHIN 5 DAYS.  LOWEST DETECTABLE LIMITS FOR URINE DRUG SCREEN Drug Class                     Cutoff (ng/mL) Amphetamine and metabolites    1000 Barbiturate and metabolites    200 Benzodiazepine                 [id number] Tricyclics and metabolites     300 Opiates and metabolites        300 Cocaine and metabolites        300 THC                            50 Performed at [facility], [place] [address], [place], [place] [address]    SARS Coronavirus 2 by RT PCR 07/06/2021 NEGATIVE  NEGATIVE Final   Comment: (NOTE) SARS-CoV-2 target nucleic acids are NOT DETECTED.  The SARS-CoV-2 RNA is generally detectable in upper respiratory specimens during the acute phase of infection. The lowest concentration of SARS-CoV-2 viral copies this assay can detect is 138 copies/mL. A negative result does not preclude SARS-Cov-2 infection and should not be used as the sole basis for treatment or other patient management decisions. A negative result may occur with  improper specimen collection/handling, submission of specimen other than nasopharyngeal swab, presence of viral mutation(s) within the areas targeted by this assay, and inadequate number of viral copies(<138 copies/mL). A negative result must be combined with clinical observations, patient history, and epidemiological information. The expected result is Negative.  Fact Sheet for Patients:  EntrepreneurPulse.com.au  Fact Sheet for Healthcare Providers:  IncredibleEmployment.be  This test is no                          t yet approved or cleared by the [place] FDA and  has been authorized for detection and/or diagnosis of SARS-CoV-2 by FDA under an Emergency Use Authorization (EUA).  This EUA will remain  in effect (meaning this test can be used) for the duration of the COVID-19 declaration under Section 564(b)(1) of the Act, 21 U.S.C.section 360bbb-3(b)(1), unless the authorization is terminated  or revoked sooner.       Influenza A by PCR 07/06/2021 NEGATIVE  NEGATIVE Final   Influenza B by PCR 07/06/2021 NEGATIVE  NEGATIVE Final   Comment: (NOTE) The Xpert Xpress SARS-CoV-2/FLU/RSV plus assay is intended as an aid in the diagnosis of influenza from Nasopharyngeal swab specimens and should not be used as a sole basis for treatment. Nasal washings and aspirates are unacceptable for Xpert Xpress SARS-CoV-2/FLU/RSV testing.  Fact Sheet for Patients: EntrepreneurPulse.com.au  Fact Sheet for Healthcare Providers: IncredibleEmployment.be  This test is not yet approved or cleared by the [place] FDA and has been authorized for detection and/or diagnosis of SARS-CoV-2 by FDA under an Emergency Use Authorization (EUA). This EUA will remain in effect (meaning this test can be used) for the duration of the COVID-19  declaration under Section 564(b)(1) of the Act, 21 U.S.C. section 360bbb-3(b)(1), unless the authorization is terminated or revoked.  Performed at [facility], [place] [address], [place], [place] [address]    WBC 07/06/2021 5.7  4.0 - 10.5 K/uL Final   RBC 07/06/2021 5.37  4.22 - 5.81 MIL/uL Final   Hemoglobin 07/06/2021 15.7  13.0 - 17.0 g/dL Final   HCT 07/06/2021 48.5  39.0 - 52.0 % Final   MCV 07/06/2021 90.3  80.0 - 100.0 fL Final   MCH 07/06/2021 29.2  26.0 - 34.0 pg Final   MCHC 07/06/2021 32.4  30.0 - 36.0 g/dL Final   RDW 07/06/2021 14.5  11.5 - 15.5 % Final   Platelets 07/06/2021 307  150 - 400 K/uL Final   nRBC 07/06/2021 0.0  0.0 - 0.2 % Final   Neutrophils Relative % 07/06/2021 53  % Final   Neutro Abs 07/06/2021 3.1  1.7 - 7.7 K/uL Final   Lymphocytes Relative 07/06/2021 35  % Final    Lymphs Abs 07/06/2021 2.0  0.7 - 4.0 K/uL Final   Monocytes Relative 07/06/2021 7  % Final   Monocytes Absolute 07/06/2021 0.4  0.1 - 1.0 K/uL Final   Eosinophils Relative 07/06/2021 3  % Final   Eosinophils Absolute 07/06/2021 0.2  0.0 - 0.5 K/uL Final   Basophils Relative 07/06/2021 1  % Final   Basophils Absolute 07/06/2021 0.0  0.0 - 0.1 K/uL Final   Immature Granulocytes 07/06/2021 1  % Final   Abs Immature Granulocytes 07/06/2021 0.06  0.00 - 0.07 K/uL Final   Performed at [facility], [place] [address] [place], [place] [address]   Color, Urine 07/06/2021 STRAW (A)  YELLOW Final   APPearance 07/06/2021 CLEAR  CLEAR Final   Specific Gravity, Urine 07/06/2021 1.010  1.005 - 1.030 Final   pH 07/06/2021 5.0  5.0 - 8.0 Final   Glucose, UA 07/06/2021 NEGATIVE  NEGATIVE mg/dL Final   Hgb urine dipstick 07/06/2021 NEGATIVE  NEGATIVE Final   Bilirubin Urine 07/06/2021 NEGATIVE  NEGATIVE Final   Ketones, ur 07/06/2021 NEGATIVE  NEGATIVE mg/dL Final   Protein, ur 07/06/2021 NEGATIVE  NEGATIVE mg/dL Final   Nitrite 07/06/2021 NEGATIVE  NEGATIVE Final   Leukocytes,Ua 07/06/2021 NEGATIVE  NEGATIVE Final   Performed at [facility], [place] [address], [place], [place] [address]  Admission on 02/21/2021, Discharged on 02/22/2021  Component Date Value Ref Range Status   WBC 02/21/2021 6.5  4.0 - 10.5 K/uL Final   RBC 02/21/2021 4.61  4.22 - 5.81 MIL/uL Final   Hemoglobin 02/21/2021 13.3  13.0 - 17.0 g/dL Final   HCT 02/21/2021 40.4  39.0 - 52.0 % Final   MCV 02/21/2021 87.6  80.0 - 100.0 fL Final   MCH 02/21/2021 28.9  26.0 - 34.0 pg Final   MCHC 02/21/2021 32.9  30.0 - 36.0 g/dL Final   RDW 02/21/2021 12.7  11.5 - 15.5 % Final   Platelets 02/21/2021 605 (H)  150 - 400 K/uL Final   nRBC 02/21/2021 0.0  0.0 - 0.2 % Final   Neutrophils Relative % 02/21/2021 49  % Final   Neutro Abs 02/21/2021 3.2  1.7 - 7.7 K/uL Final   Lymphocytes Relative 02/21/2021 43  % Final   Lymphs Abs  02/21/2021 2.8  0.7 - 4.0 K/uL Final   Monocytes Relative 02/21/2021 6  % Final   Monocytes Absolute 02/21/2021 0.4  0.1 - 1.0 K/uL Final   Eosinophils  Relative 02/21/2021 1  % Final   Eosinophils Absolute 02/21/2021 0.1  0.0 - 0.5 K/uL Final   Basophils Relative 02/21/2021 0  % Final   Basophils Absolute 02/21/2021 0.0  0.0 - 0.1 K/uL Final   Immature Granulocytes 02/21/2021 1  % Final   Abs Immature Granulocytes 02/21/2021 0.04  0.00 - 0.07 K/uL Final   Performed at [facility] [place] [address], [place], [place] [address]   Sodium 02/21/2021 140  135 - 145 mmol/L Final   Potassium 02/21/2021 3.2 (L)  3.5 - 5.1 mmol/L Final   Chloride 02/21/2021 105  98 - 111 mmol/L Final   CO2 02/21/2021 22  22 - 32 mmol/L Final   Glucose, Bld 02/21/2021 102 (H)  70 - 99 mg/dL Final   Glucose reference range applies only to samples taken after fasting for at least 8 hours.   BUN 02/21/2021 10  8 - 23 mg/dL Final   Creatinine, Ser 02/21/2021 0.80  0.61 - 1.24 mg/dL Final   Calcium 02/21/2021 9.5  8.9 - 10.3 mg/dL Final   GFR, Estimated 02/21/2021 >60  >60 mL/min Final   Comment: (NOTE) Calculated using the CKD-EPI Creatinine Equation (2021)    Anion gap 02/21/2021 13  5 - 15 Final   Performed at [facility], [place] [address], [place], [place] [address]   Specimen Description 02/22/2021    Final                   Value:JOINT FLUID RIGHT ELBOW Performed at [facility], [place] [address], [place], [place] [address]    Special Requests 02/22/2021    Final                   Value:Normal Performed at [facility], [place] [address], [place], [place] [address]    Gram Stain 02/22/2021    Final                   Value:RARE WBC PRESENT,BOTH PMN AND MONONUCLEAR NO ORGANISMS SEEN    Culture 02/22/2021    Final                   Value:NO GROWTH 3 DAYS Performed at [facility], [place] [address], [place], [place] [address]     Report Status 02/22/2021 02/25/2021 FINAL   Final   Color, Synovial 02/22/2021 RED (A)  YELLOW Final   Appearance-Synovial 02/22/2021 TURBID (A)  CLEAR Final   Crystals, Fluid 02/22/2021 EXTRACELLULAR MONOSODIUM URATE CRYSTALS   Final   WBC, Synovial 02/22/2021 7,070 (H)  0 - 200 /cu mm Final   Neutrophil, Synovial 02/22/2021 55 (H)  0 - 25 % Final   Lymphocytes-Synovial Fld 02/22/2021 6  0 - 20 % Final   Monocyte-Macrophage-Synovial Fluid 02/22/2021 39 (L)  50 - 90 % Final   Eosinophils-Synovial 02/22/2021 0  0 - 1 % Final   Other Cells-SYN 02/22/2021 OTHER CELLS IDENTIFIED AS SYNOVIAL LINING CELLS   Final   Performed at [facility], [place] [address], [place], [place] [address]  Admission on 02/09/2021, Discharged on 02/10/2021  Component Date Value Ref Range Status   Lactic Acid, Venous 02/10/2021 1.1  0.5 - 1.9 mmol/L Final   Performed at [facility], [place] [address] [place], [place] [address]   Lactic Acid, Venous 02/09/2021 0.8  0.5 - 1.9 mmol/L Final   Performed at [facility], [address] [place], [place] [address]   Sodium 02/09/2021 136  135 - 145 mmol/L Final   Potassium 02/09/2021 3.2 (L)  3.5 - 5.1 mmol/L Final   Chloride 02/09/2021 98  98 - 111 mmol/L Final   CO2 02/09/2021 27  22 - 32 mmol/L Final   Glucose, Bld 02/09/2021 103 (H)  70 - 99 mg/dL Final   Glucose reference range applies only to samples taken after fasting for at least 8 hours.   BUN 02/09/2021 12  8 - 23 mg/dL Final   Creatinine, Ser 02/09/2021 0.81  0.61 - 1.24 mg/dL Final   Calcium 02/09/2021 9.1  8.9 - 10.3 mg/dL Final   Total Protein 02/09/2021 7.3  6.5 - 8.1 g/dL Final   Albumin 02/09/2021 3.4 (L)  3.5 - 5.0 g/dL Final   AST 02/09/2021 13 (L)  15 - 41 U/L Final   ALT 02/09/2021 13  0 - 44 U/L Final   Alkaline Phosphatase 02/09/2021 60  38 - 126 U/L Final   Total Bilirubin 02/09/2021 1.1  0.3 - 1.2 mg/dL Final   GFR, Estimated 02/09/2021 >60   >60 mL/min Final   Comment: (NOTE) Calculated using the CKD-EPI Creatinine Equation (2021)    Anion gap 02/09/2021 11  5 - 15 Final   Performed at [facility], [place] [address] [place], [place] [address]   WBC 02/09/2021 12.8 (H)  4.0 - 10.5 K/uL Final   RBC 02/09/2021 4.31  4.22 - 5.81 MIL/uL Final   Hemoglobin 02/09/2021 12.7 (L)  13.0 - 17.0 g/dL Final   HCT 02/09/2021 37.8 (L)  39.0 - 52.0 % Final   MCV 02/09/2021 87.7  80.0 - 100.0 fL Final   MCH 02/09/2021 29.5  26.0 - 34.0 pg Final   MCHC 02/09/2021 33.6  30.0 - 36.0 g/dL Final   RDW 02/09/2021 12.3  11.5 - 15.5 % Final   Platelets 02/09/2021 368  150 - 400 K/uL Final   nRBC 02/09/2021 0.0  0.0 - 0.2 % Final   Neutrophils Relative % 02/09/2021 75  % Final   Neutro Abs 02/09/2021 9.6 (H)  1.7 - 7.7 K/uL Final   Lymphocytes Relative 02/09/2021 16  % Final   Lymphs Abs 02/09/2021 2.1  0.7 - 4.0 K/uL Final   Monocytes Relative 02/09/2021 8  % Final   Monocytes Absolute 02/09/2021 1.0  0.1 - 1.0 K/uL Final   Eosinophils Relative 02/09/2021 0  % Final   Eosinophils Absolute 02/09/2021 0.0  0.0 - 0.5 K/uL Final   Basophils Relative 02/09/2021 0  % Final   Basophils Absolute 02/09/2021 0.0  0.0 - 0.1 K/uL Final   Immature Granulocytes 02/09/2021 1  % Final   Abs Immature Granulocytes 02/09/2021 0.09 (H)  0.00 - 0.07 K/uL Final   Performed at [facility], [place] [address] [place], [place] [address]   Color, Urine 02/10/2021 YELLOW  YELLOW Final   APPearance 02/10/2021 CLEAR  CLEAR Final   Specific Gravity, Urine 02/10/2021 1.015  1.005 - 1.030 Final   pH 02/10/2021 6.0  5.0 - 8.0 Final   Glucose, UA 02/10/2021 NEGATIVE  NEGATIVE mg/dL Final   Hgb urine dipstick 02/10/2021 NEGATIVE  NEGATIVE Final   Bilirubin Urine 02/10/2021 NEGATIVE  NEGATIVE Final   Ketones, ur 02/10/2021 20 (A)  NEGATIVE mg/dL Final   Protein, ur 02/10/2021 NEGATIVE  NEGATIVE mg/dL Final   Nitrite 02/10/2021 NEGATIVE   NEGATIVE Final   Leukocytes,Ua 02/10/2021 NEGATIVE  NEGATIVE Final  Performed at [facility], [address], [place], [place] [address]   Specimen Description 02/09/2021    Final                   Value:BLOOD LEFT ANTECUBITAL Performed at [facility], [address], [place], [place] [address]    Special Requests 02/09/2021    Final                   Value:BOTTLES DRAWN AEROBIC AND ANAEROBIC Blood Culture adequate volume Performed at [facility], [address], [place], [place] [address]    Culture 02/09/2021    Final                   Value:NO GROWTH 5 DAYS Performed at [facility], [address], [place], [place] [address]    Report Status 02/09/2021 02/15/2021 FINAL   Final   Specimen Description 02/09/2021    Final                   Value:BLOOD LEFT ANTECUBITAL Performed at [facility], [address], [place], [place] [address]    Special Requests 02/09/2021    Final                   Value:BOTTLES DRAWN AEROBIC AND ANAEROBIC Blood Culture adequate volume Performed at [facility], [address] [place], [place] [address]    Culture 02/09/2021    Final                   Value:NO GROWTH 5 DAYS Performed at [facility], [address] [place], [place] [address]    Report Status 02/09/2021 02/15/2021 FINAL   Final   Sed Rate 02/09/2021 89 (H)  0 - 16 mm/hr Final   Performed at [facility], [address], [place], [place] [address]   CRP 02/09/2021 14.7 (H)  <1.0 mg/dL Final   Performed at [facility], [address], [place], [place] [address]    Blood Alcohol level:  Lab Results  Component Value Date   ETH <10 08/04/2021   ETH 277 (H) 07/29/2021    Metabolic Disorder Labs: Lab Results  Component Value Date   HGBA1C 5.4 07/29/2021   MPG 108.28 07/29/2021   MPG 108.28 11/26/2020   No results found for:  PROLACTIN Lab Results  Component Value Date   CHOL 259 (H) 07/29/2021   TRIG 62 07/29/2021   HDL >135 07/29/2021   CHOLHDL NOT CALCULATED 07/29/2021   VLDL 12 07/29/2021   LDLCALC NOT CALCULATED 07/29/2021    Therapeutic Lab Levels: No results found for: LITHIUM No results found for: VALPROATE No components found for:  CBMZ  Physical Findings   Flowsheet Row ED from 08/04/2021 in [facility] ED from 07/29/2021 in [facility] ED from 07/06/2021 in [facility] EMERGENCY DEPARTMENT  C-SSRS RISK CATEGORY No Risk Low Risk High Risk        Musculoskeletal  Strength & Muscle Tone: within normal limits Gait & Station: normal Patient leans: N/A  Psychiatric Specialty Exam  Presentation  General Appearance: Appropriate for Environment  Eye Contact:Good  Speech:Clear and Coherent; Normal Rate  Speech Volume:Normal  Handedness:Right   Mood and Affect  Mood:Anxious  Affect:Congruent   Thought Process  Thought Processes:Coherent; Goal Directed  Descriptions of Associations:Intact  Orientation:Full (  Time, Place and Person)  Thought Content:Logical; WDL  Diagnosis of Schizophrenia or Schizoaffective disorder in past: No    Hallucinations:Hallucinations: None  Ideas of Reference:None  Suicidal Thoughts:Suicidal Thoughts: Yes, Passive SI Active Intent and/or Plan: Without Intent; Without Plan (Passive suicidal thoughts more condictional related to alcohol use disorder and not being able to get into treatment)  Homicidal Thoughts:Homicidal Thoughts: No   Sensorium  Memory:Immediate Good; Recent Good; Remote Good  Judgment:Intact  Insight:Present   Executive Functions  Concentration:Good  Attention Span:Good  Recall:Good  Fund of Knowledge:Good  Language:Good   Psychomotor Activity  Psychomotor Activity:Psychomotor Activity: Normal   Assets  Assets:Communication Skills;  Desire for Improvement; Housing; Health and safety inspectorinancial Resources/Insurance; Resilience; Social Support   Sleep  Sleep:Sleep: Good   Nutritional Assessment (For OBS and FBC admissions only) Has the patient had a weight loss or gain of 10 pounds or more in the last 3 months?: No Has the patient had a decrease in food intake/or appetite?: No Does the patient have dental problems?: No Does the patient have eating habits or behaviors that may be indicators of an eating disorder including binging or inducing vomiting?: No Has the patient recently lost weight without trying?: 1 Has the patient been eating poorly because of a decreased appetite?: 0 Malnutrition Screening Tool Score: 1 Nutritional Assessment Referrals: Other (comment) (Alcohol detox and treatment)    Physical Exam  Physical Exam Vitals and nursing note reviewed. Exam conducted with a chaperone present.  Constitutional:      General: He is not in acute distress.    Appearance: Normal appearance. He is not ill-appearing.  Cardiovascular:     Rate and Rhythm: Normal rate.  Pulmonary:     Effort: Pulmonary effort is normal.  Musculoskeletal:        General: Normal range of motion.     Cervical back: Normal range of motion.  Skin:    General: Skin is warm and dry.  Neurological:     Mental Status: He is alert and oriented to person, place, and time.  Psychiatric:        Attention and Perception: Attention and perception normal. He does not perceive auditory or visual hallucinations.        Mood and Affect: Affect normal. Mood is anxious.        Speech: Speech normal.        Behavior: Behavior normal. Behavior is cooperative.        Thought Content: Thought content is not paranoid or delusional. Thought content does not include homicidal ideation. Suicidal: Continues to report some passive suicidal thoughts if unable to get into a rehab treatment program.  No intent or plan voiced.Thought content does not include suicidal plan.         Cognition and Memory: Cognition and memory normal.        Judgment: Judgment normal.   Review of Systems  Constitutional: Negative.   HENT: Negative.    Eyes: Negative.   Respiratory: Negative.    Cardiovascular: Negative.   Gastrointestinal: Negative.   Genitourinary: Negative.   Musculoskeletal: Negative.   Skin: Negative.   Neurological: Negative.   Endo/Heme/Allergies: Negative.   Psychiatric/Behavioral:  Positive for substance abuse (Alcohol). Depression: Stable. Hallucinations: Denies. Suicidal ideas: Passive thoughts no plan or intent.Nervous/anxious: Stable.   Blood pressure 128/86, pulse 68, temperature 97.9 F (36.6 C), temperature source Oral, resp. rate 18, SpO2 98 %. There is no height or weight on file to calculate BMI.  Treatment Plan Summary: Daily contact with  patient to assess and evaluate symptoms and progress in treatment, Medication management, and Plan Social work to continue working with patient with referral to substance use rehabilitation facilities.   If unable to get into rehab may need inpatient psychiatric treatment or readmission to facility based crisis related to unable to contract for safety if unable to get into a rehab program.    Earleen Newport, NP 08/05/2021 10:05 AM

## 2021-08-06 ENCOUNTER — Other Ambulatory Visit: Payer: Self-pay | Admitting: Registered Nurse

## 2021-08-06 ENCOUNTER — Encounter (HOSPITAL_COMMUNITY): Payer: Self-pay | Admitting: Psychiatry

## 2021-08-06 ENCOUNTER — Inpatient Hospital Stay (HOSPITAL_COMMUNITY)
Admission: AD | Admit: 2021-08-06 | Discharge: 2021-08-14 | DRG: 885 | Disposition: A | Discharge status: Home / Self Care | Payer: Federal, State, Local not specified - Other | Attending: Psychiatry | Admitting: Psychiatry

## 2021-08-06 ENCOUNTER — Other Ambulatory Visit: Payer: Self-pay

## 2021-08-06 ENCOUNTER — Encounter (HOSPITAL_COMMUNITY): Payer: Self-pay | Admitting: Registered Nurse

## 2021-08-06 DIAGNOSIS — R45851 Suicidal ideations: Secondary | ICD-10-CM | POA: Diagnosis present

## 2021-08-06 DIAGNOSIS — G4700 Insomnia, unspecified: Secondary | ICD-10-CM | POA: Diagnosis present

## 2021-08-06 DIAGNOSIS — Z818 Family history of other mental and behavioral disorders: Secondary | ICD-10-CM | POA: Diagnosis not present

## 2021-08-06 DIAGNOSIS — F10239 Alcohol dependence with withdrawal, unspecified: Secondary | ICD-10-CM | POA: Diagnosis present

## 2021-08-06 DIAGNOSIS — I10 Essential (primary) hypertension: Secondary | ICD-10-CM | POA: Diagnosis present

## 2021-08-06 DIAGNOSIS — F411 Generalized anxiety disorder: Secondary | ICD-10-CM | POA: Diagnosis present

## 2021-08-06 DIAGNOSIS — F332 Major depressive disorder, recurrent severe without psychotic features: Principal | ICD-10-CM | POA: Diagnosis present

## 2021-08-06 DIAGNOSIS — F1020 Alcohol dependence, uncomplicated: Secondary | ICD-10-CM

## 2021-08-06 DIAGNOSIS — F4323 Adjustment disorder with mixed anxiety and depressed mood: Secondary | ICD-10-CM | POA: Diagnosis not present

## 2021-08-06 DIAGNOSIS — M109 Gout, unspecified: Secondary | ICD-10-CM | POA: Diagnosis present

## 2021-08-06 DIAGNOSIS — F1994 Other psychoactive substance use, unspecified with psychoactive substance-induced mood disorder: Secondary | ICD-10-CM | POA: Diagnosis not present

## 2021-08-06 DIAGNOSIS — Z79899 Other long term (current) drug therapy: Secondary | ICD-10-CM | POA: Diagnosis not present

## 2021-08-06 MED ORDER — SERTRALINE HCL 50 MG PO TABS
50.0000 mg | ORAL_TABLET | Freq: Every day | ORAL | Status: DC
  Administered 2021-08-06 – 2021-08-09 (×4): 50 mg via ORAL
  Filled 2021-08-06 (×9): qty 1

## 2021-08-06 MED ORDER — HYDROXYZINE HCL 25 MG PO TABS: 25.0000 mg | ORAL_TABLET | Freq: Three times a day (TID) | ORAL | 0 refills | Status: AC | PRN

## 2021-08-06 MED ORDER — HYDROXYZINE HCL 25 MG PO TABS
25.0000 mg | ORAL_TABLET | Freq: Three times a day (TID) | ORAL | Status: DC | PRN
  Administered 2021-08-07 – 2021-08-14 (×12): 25 mg via ORAL
  Filled 2021-08-06 (×12): qty 1
  Filled 2021-08-06: qty 10

## 2021-08-06 MED ORDER — GABAPENTIN 100 MG PO CAPS
200.0000 mg | ORAL_CAPSULE | Freq: Three times a day (TID) | ORAL | Status: DC
  Administered 2021-08-07 – 2021-08-12 (×17): 200 mg via ORAL
  Filled 2021-08-06 (×25): qty 2

## 2021-08-06 MED ORDER — ALUM & MAG HYDROXIDE-SIMETH 200-200-20 MG/5ML PO SUSP: 30.0000 mL | ORAL | Status: DC | PRN

## 2021-08-06 MED ORDER — TRAZODONE HCL 50 MG PO TABS: 50.0000 mg | ORAL_TABLET | Freq: Every evening | ORAL | 0 refills | Status: DC | PRN

## 2021-08-06 MED ORDER — AMLODIPINE BESYLATE 5 MG PO TABS
5.0000 mg | ORAL_TABLET | Freq: Every day | ORAL | Status: DC
  Administered 2021-08-06 – 2021-08-14 (×9): 5 mg via ORAL
  Filled 2021-08-06 (×13): qty 1
  Filled 2021-08-06: qty 7

## 2021-08-06 MED ORDER — TRAZODONE HCL 50 MG PO TABS
50.0000 mg | ORAL_TABLET | Freq: Every evening | ORAL | Status: DC | PRN
  Administered 2021-08-06 – 2021-08-09 (×4): 50 mg via ORAL
  Filled 2021-08-06 (×5): qty 1

## 2021-08-06 MED ORDER — MAGNESIUM HYDROXIDE 400 MG/5ML PO SUSP: 30.0000 mL | Freq: Every day | ORAL | Status: DC | PRN

## 2021-08-06 MED ORDER — INDOMETHACIN 25 MG PO CAPS
25.0000 mg | ORAL_CAPSULE | Freq: Three times a day (TID) | ORAL | Status: DC | PRN
  Administered 2021-08-06: 25 mg via ORAL
  Filled 2021-08-06: qty 1

## 2021-08-06 NOTE — ED Notes (Signed)
Pt sleeping no sleep disturbance noted. 

## 2021-08-06 NOTE — ED Notes (Signed)
Pt eating breakfast 

## 2021-08-06 NOTE — Plan of Care (Signed)
Nurse discussed coping skills with patient.  

## 2021-08-06 NOTE — [facility] Group Notes (Signed)
Adult Psychoeducational Group Note  Date:  08/06/2021 Time:  4:38 PM  Group Topic/Focus:  Relaxation  Participation Level:  Active  Participation Quality:  Appropriate  Affect:  Appropriate  Cognitive:  Appropriate  Insight: Appropriate  Engagement in Group:  Engaged  Modes of Intervention:  Activity   Donell Beers 08/06/2021, 4:38 PM

## 2021-08-06 NOTE — ED Notes (Signed)
Pt eating lunch

## 2021-08-06 NOTE — ED Notes (Signed)
Pt given materials on goal exploration. This Clinical research associate explained the materials and pt was given a worksheet to complete. Pt verbalized understanding and began working on the worksheet.

## 2021-08-06 NOTE — ED Notes (Signed)
Pt is sleeping in the bed at present. Respirations are even and unlabored. No acute distress is noted. Will continue to monitor for safety 

## 2021-08-06 NOTE — ED Provider Notes (Signed)
FBC/OBS ASAP Discharge Summary  Date and Time: 08/06/2021 11:56 AM  Name: Willie Collins  MRN:  [id number]   Discharge Diagnoses:  Final diagnoses:  Passive suicidal ideations  Substance induced mood disorder (HCC)  Adjustment disorder with mixed anxiety and depressed mood  Severe episode of recurrent major depressive disorder, without psychotic features (HCC)     Patient accepted to [facility] for inpatient psychiatric treatment    Past Medical History:  Past Medical History:  Diagnosis Date   Hypertension     Past Surgical History:  Procedure Laterality Date   IRRIGATION AND DEBRIDEMENT FOOT Left 11/25/2020   Procedure: IRRIGATION AND DEBRIDEMENT FOOT;  Surgeon: Candelaria Stagers, DPM;  Location: [facility];  Service: Podiatry;  Laterality: Left;   Family History: History reviewed. No pertinent family history.  Social History:  Social History   Substance and Sexual Activity  Alcohol Use Yes   Comment: weekends     Social History   Substance and Sexual Activity  Drug Use Never    Social History   Socioeconomic History   Marital status: Single    Spouse name: Not on file   Number of children: Not on file   Years of education: Not on file   Highest education level: Not on file  Occupational History   Not on file  Tobacco Use   Smoking status: Never   Smokeless tobacco: Never  Vaping Use   Vaping Use: Never used  Substance and Sexual Activity   Alcohol use: Yes    Comment: weekends   Drug use: Never   Sexual activity: Yes    Partners: Female  Other Topics Concern   Not on file  Social History Narrative   Not on file   Social Determinants of Health   Financial Resource Strain: Not on file  Food Insecurity: Not on file  Transportation Needs: Not on file  Physical Activity: Not on file  Stress: Not on file  Social Connections: Not on file   SDOH:  SDOH Screenings   Alcohol Screen: Not on file  Depression (PHQ2-9): Not on file  Financial Resource  Strain: Not on file  Food Insecurity: Not on file  Housing: Not on file  Physical Activity: Not on file  Social Connections: Not on file  Stress: Not on file  Tobacco Use: Low Risk    Smoking Tobacco Use: Never   Smokeless Tobacco Use: Never   Passive Exposure: Not on file  Transportation Needs: Not on file   Current Medications:  Current Facility-Administered Medications  Medication Dose Route Frequency Provider Last Rate Last Admin   alum & mag hydroxide-simeth (MAALOX/MYLANTA) 200-200-20 MG/5ML suspension 30 mL  30 mL Oral Q4H PRN Adryan Druckenmiller B, NP       amLODipine (NORVASC) tablet 5 mg  5 mg Oral Daily Fey Coghill B, NP   5 mg at 08/06/21 9983   gabapentin (NEURONTIN) capsule 200 mg  200 mg Oral TID Darian Ace B, NP   200 mg at 08/06/21 3825   hydrOXYzine (ATARAX/VISTARIL) tablet 25 mg  25 mg Oral TID PRN Jackelyn Poling, NP   25 mg at 08/05/21 1402   indomethacin (INDOCIN) capsule 25 mg  25 mg Oral TID PRN Andrei Mccook B, NP   25 mg at 08/06/21 0539   loperamide (IMODIUM) capsule 2-4 mg  2-4 mg Oral PRN Nira Conn A, NP       magnesium hydroxide (MILK OF MAGNESIA) suspension 30 mL  30 mL Oral Daily  PRN Anureet Bruington B, NP       ondansetron (ZOFRAN-ODT) disintegrating tablet 4 mg  4 mg Oral Q6H PRN Nira Conn A, NP       sertraline (ZOLOFT) tablet 50 mg  50 mg Oral Daily Medardo Hassing B, NP   50 mg at 08/06/21 0927   traZODone (DESYREL) tablet 50 mg  50 mg Oral QHS PRN Lennell Shanks B, NP   50 mg at 08/05/21 2130   Current Outpatient Medications  Medication Sig Dispense Refill   amLODipine (NORVASC) 5 MG tablet Take 1 tablet (5 mg total) by mouth daily. 14 tablet 1   gabapentin (NEURONTIN) 100 MG capsule Take 2 capsules (200 mg total) by mouth 3 (three) times daily. 180 capsule 1   hydrOXYzine (ATARAX/VISTARIL) 25 MG tablet Take 1 tablet (25 mg total) by mouth 3 (three) times daily as needed for anxiety. 30 tablet 0   indomethacin (INDOCIN) 25 MG capsule Take 1  capsule (25 mg total) by mouth 3 (three) times daily as needed for moderate pain or mild pain (gout pain). 90 capsule 1   sertraline (ZOLOFT) 50 MG tablet Take 1 tablet (50 mg total) by mouth daily. 30 tablet 1    PTA Medications: (Not in a hospital admission)  Psychiatric Specialty Exam  Presentation  General Appearance: Appropriate for Environment  Eye Contact:Good  Speech:Clear and Coherent; Normal Rate  Speech Volume:Normal  Handedness:Right   Mood and Affect  Mood:Anxious; Dysphoric  Affect:Congruent   Thought Process  Thought Processes:Coherent; Goal Directed  Descriptions of Associations:Intact  Orientation:Full (Time, Place and Person)  Thought Content:WDL  Diagnosis of Schizophrenia or Schizoaffective disorder in past: No    Hallucinations:Hallucinations: None  Ideas of Reference:None  Suicidal Thoughts:Suicidal Thoughts: Yes, Passive SI Active Intent and/or Plan: Without Intent; Without Plan (Passive suicidal thoughts more condictional related to alcohol use disorder and not being able to get into treatment) SI Passive Intent and/or Plan: Without Intent; Without Plan  Homicidal Thoughts:Homicidal Thoughts: No   Sensorium  Memory:Immediate Good; Recent Good; Remote Good  Judgment:Fair  Insight:Fair; Present   Executive Functions  Concentration:Good  Attention Span:Good  Recall:Good  Fund of Knowledge:Good  Language:Good   Psychomotor Activity  Psychomotor Activity:Psychomotor Activity: Normal   Assets  Assets:Communication Skills; Desire for Improvement; Housing; Resilience; Social Support   Sleep  Sleep:Sleep: Good   Nutritional Assessment (For OBS and [facility]BC admissions only) Has the patient had a weight loss or gain of 10 pounds or more in the last 3 months?: No Has the patient had a decrease in food intake/or appetite?: No Does the patient have dental problems?: No Does the patient have eating habits or behaviors that may be  indicators of an eating disorder including binging or inducing vomiting?: No Has the patient recently lost weight without trying?: 0 Has the patient been eating poorly because of a decreased appetite?: 0 Malnutrition Screening Tool Score: 0 Nutritional Assessment Referrals: Other (comment) (Alcohol detox and treatment)    Physical Exam  Physical Exam Vitals and nursing note reviewed. Exam conducted with a chaperone present.  Constitutional:      General: He is not in acute distress.    Appearance: Normal appearance. He is not ill-appearing.  Cardiovascular:     Rate and Rhythm: Normal rate.  Pulmonary:     Effort: Pulmonary effort is normal.  Musculoskeletal:        General: Normal range of motion.     Cervical back: Normal range of motion.  Skin:  General: Skin is warm and dry.  Neurological:     Mental Status: He is alert and oriented to person, place, and time.  Psychiatric:        Attention and Perception: Attention and perception normal. He does not perceive auditory or visual hallucinations.        Mood and Affect: Affect normal. Mood is anxious.        Speech: Speech normal.        Behavior: Behavior normal. Behavior is cooperative.        Thought Content: Thought content is not paranoid or delusional. Thought content does not include homicidal ideation. Suicidal: Continues to report some passive suicidal thoughts if unable to get into a rehab treatment program.  No intent or plan voiced.Thought content does not include suicidal plan.        Cognition and Memory: Cognition and memory normal.        Judgment: Judgment normal.   Review of Systems  Constitutional: Negative.   HENT: Negative.    Eyes: Negative.   Respiratory: Negative.    Cardiovascular: Negative.   Gastrointestinal: Negative.   Genitourinary: Negative.   Musculoskeletal: Negative.   Skin: Negative.   Neurological: Negative.   Endo/Heme/Allergies: Negative.   Psychiatric/Behavioral:  Positive for  substance abuse (Alcohol). Depression: Stable. Hallucinations: Denies. Suicidal ideas: Passive thoughts no plan or intent.Nervous/anxious: Stable.   Blood pressure 127/79, pulse 64, temperature 98.7 F (37.1 C), temperature source Oral, resp. rate 18, SpO2 98 %. There is no height or weight on file to calculate BMI.  Disposition: Inpatient psychiatric treatment.  Patient accepted to [facility]  Saahil Herbster, NP 08/06/2021, 11:56 AM

## 2021-08-06 NOTE — ED Notes (Signed)
Pt is sleeping in the bed at present. Respirations are even and unlabored. No acute distress is noted. Will continue to monitor for safety

## 2021-08-06 NOTE — Plan of Care (Signed)
Nurse discussed anxiety, depression and coping skills with patient.  

## 2021-08-06 NOTE — Tx Team (Signed)
Initial Treatment Plan 08/06/2021 7:08 PM Willie Collins [id number]    PATIENT STRESSORS: Financial difficulties   Legal issue   Occupational concerns   Substance abuse     PATIENT STRENGTHS: Ability for insight  Average or above average intelligence  Capable of independent living  Communication skills  General fund of knowledge  Motivation for treatment/growth  Physical Health  Supportive family/friends  Work skills    PATIENT IDENTIFIED PROBLEMS: "Anxiety"  "Depression"  "Alcohol abuse"  "Suicide thoughts"               DISCHARGE CRITERIA:  Ability to meet basic life and health needs Adequate post-discharge living arrangements Improved stabilization in mood, thinking, and/or behavior Medical problems require only outpatient monitoring Motivation to continue treatment in a less acute level of care Need for constant or close observation no longer present Reduction of life-threatening or endangering symptoms to within safe limits Safe-care adequate arrangements made Verbal commitment to aftercare and medication compliance Withdrawal symptoms are absent or subacute and managed without 24-hour nursing intervention  PRELIMINARY DISCHARGE PLAN: Attend aftercare/continuing care group Attend PHP/IOP Attend 12-step recovery group Outpatient therapy Return to previous living arrangement  PATIENT/FAMILY INVOLVEMENT: This treatment plan has been presented to and reviewed with the patient, Willie Collins.  The patient and family have been given the opportunity to ask questions and make suggestions.  Quintella Reichert [place], [place] 08/06/2021, 7:08 PM

## 2021-08-06 NOTE — ED Notes (Signed)
Received patient awake in bed.  He reports sleeping well and feeling "ok".  He is now in the shower and getting ready for the day.  He is planning on making phone calls to [facility] to establish if they have a bed for him.  Denies avh shi or plan.  Will monitor and provide safe environment.

## 2021-08-06 NOTE — ED Provider Notes (Signed)
Behavioral Health Progress Note  Date and Time: 08/06/2021 10:18 AM Name: Willie Collins MRN:  [id number]  Subjective:   Willie Collins was hospitalized at the [facility] from 07/29/21 until 08/04/21 for alcohol withdrawal/detox at which time he completed Ativan taper.  He was discharged to [place] on 08/04/21 after insurance information was verified by [facility] and deemed appropriate; however once there he was denied admission related to issues with his insurance.  Patient reporting that he was informed by [facility] upon arrival that his insurance was not accepted at their facility related to it being [facility] and he was provided transportation back to [employer].  Patient readmitted to [facility] 08/04/21 to the continuous assessment unit related to passive suicidal ideation and unable to contract for safety.  Patient was transferred to [facility] based crisis unit 08/05/21 related still unable to contract for safety.  Suicidal ideation based on getting into treatment program that is not outpatient.  Patient unable to contract for safety at home.        Willie Collins, seen face to face by this provider, consulted with Dr. Junius Creamer; and chart reviewed on 08/06/21.  On evaluation Willie Collins reports he is hopeful that he will get into a rehab facility.  States he called [facility] again this morning "I've been accepted but they still don't have an open bed."  Patient was informed that it may take a week or more before bed becomes available and that he would need to continue calling daily for bed availability.  Patient also informed that he may be discharged prior to bed becoming available at [facility].  Patient then became tearful stating that he does not believe he would be able to make it at home.  "I can't go home.  I've got to get help.  If I go home I know I will fail and it just will not be no use in continuing.  "I can't go home."  Currently patient states that he is  eating and sleeping without any difficulty.  Reports he has been tolerating his medications without any adverse reaction.  Patient also denies homicidal ideation, psychosis, and paranoia.  During evaluation Willie Collins is sitting in chair in no acute distress.  He is alert, oriented x 4, calm, cooperative and attentive.  His mood is euthymic with congruent affect: But becomes dysphoric and tearful when discussing discharge home.  He has normal speech.  Objectively there is no evidence of psychosis/mania or delusional thinking.  Patient is able to converse coherently, goal directed thoughts, no distractibility, or pre-occupation.  He also denies suicidal/self-harm/homicidal ideation, psychosis, and paranoia; other than when discussing possible discharge home and the passive suicidal thoughts and unable to contract for safety if at home.  Patient answered question appropriately.     Diagnosis:  Final diagnoses:  Passive suicidal ideations  Substance induced mood disorder (HCC)  Adjustment disorder with mixed anxiety and depressed mood  Severe episode of recurrent major depressive disorder, without psychotic features ([place])    Total Time spent with patient: 20 minutes  Past Psychiatric History: Major depressive disorder alcohol use disorder Past Medical History:  Past Medical History:  Diagnosis Date   Hypertension     Past Surgical History:  Procedure Laterality Date   IRRIGATION AND DEBRIDEMENT FOOT Left 11/25/2020   Procedure: IRRIGATION AND DEBRIDEMENT FOOT;  Surgeon: Felipa Furnace, DPM;  Location: [facility];  Service: Podiatry;  Laterality: Left;   Family History: History reviewed. No pertinent family history. Family  Psychiatric  History: None reported Social History:  Social History   Substance and Sexual Activity  Alcohol Use Yes   Comment: weekends     Social History   Substance and Sexual Activity  Drug Use Never    Social History   Socioeconomic History   Marital  status: Single    Spouse name: Not on file   Number of children: Not on file   Years of education: Not on file   Highest education level: Not on file  Occupational History   Not on file  Tobacco Use   Smoking status: Never   Smokeless tobacco: Never  Vaping Use   Vaping Use: Never used  Substance and Sexual Activity   Alcohol use: Yes    Comment: weekends   Drug use: Never   Sexual activity: Yes    Partners: Female  Other Topics Concern   Not on file  Social History Narrative   Not on file   Social Determinants of Health   Financial Resource Strain: Not on file  Food Insecurity: Not on file  Transportation Needs: Not on file  Physical Activity: Not on file  Stress: Not on file  Social Connections: Not on file   SDOH:  SDOH Screenings   Alcohol Screen: Not on file  Depression (PHQ2-9): Not on file  Financial Resource Strain: Not on file  Food Insecurity: Not on file  Housing: Not on file  Physical Activity: Not on file  Social Connections: Not on file  Stress: Not on file  Tobacco Use: Low Risk    Smoking Tobacco Use: Never   Smokeless Tobacco Use: Never   Passive Exposure: Not on file  Transportation Needs: Not on file   Additional Social History:                         Sleep: Good  Appetite:  Good  Current Medications:  Current Facility-Administered Medications  Medication Dose Route Frequency Provider Last Rate Last Admin   alum & mag hydroxide-simeth (MAALOX/MYLANTA) 200-200-20 MG/5ML suspension 30 mL  30 mL Oral Q4H PRN Kenisha Lynds B, NP       amLODipine (NORVASC) tablet 5 mg  5 mg Oral Daily Odalis Jordan B, NP   5 mg at 08/06/21 [id number]   gabapentin (NEURONTIN) capsule 200 mg  200 mg Oral TID Lajarvis Italiano B, NP   200 mg at 08/06/21 [id number]   hydrOXYzine (ATARAX/VISTARIL) tablet 25 mg  25 mg Oral TID PRN Rozetta Nunnery, NP   25 mg at 08/05/21 1402   indomethacin (INDOCIN) capsule 25 mg  25 mg Oral TID PRN Laylah Riga B, NP   25 mg at  08/06/21 [id number]   loperamide (IMODIUM) capsule 2-4 mg  2-4 mg Oral PRN Rozetta Nunnery, NP       magnesium hydroxide (MILK OF MAGNESIA) suspension 30 mL  30 mL Oral Daily PRN Lulu Hirschmann B, NP       ondansetron (ZOFRAN-ODT) disintegrating tablet 4 mg  4 mg Oral Q6H PRN Lindon Romp A, NP       sertraline (ZOLOFT) tablet 50 mg  50 mg Oral Daily Quetzally Callas B, NP   50 mg at 08/06/21 0927   traZODone (DESYREL) tablet 50 mg  50 mg Oral QHS PRN Nekesha Font B, NP   50 mg at 08/05/21 2130   Current Outpatient Medications  Medication Sig Dispense Refill   amLODipine (NORVASC) 5 MG tablet Take 1 tablet (5  mg total) by mouth daily. 14 tablet 1   gabapentin (NEURONTIN) 100 MG capsule Take 2 capsules (200 mg total) by mouth 3 (three) times daily. 180 capsule 1   indomethacin (INDOCIN) 25 MG capsule Take 1 capsule (25 mg total) by mouth 3 (three) times daily as needed for moderate pain or mild pain (gout pain). 90 capsule 1   sertraline (ZOLOFT) 50 MG tablet Take 1 tablet (50 mg total) by mouth daily. 30 tablet 1    Labs  Lab Results:  Admission on 08/04/2021  Component Date Value Ref Range Status   SARS Coronavirus 2 by RT PCR 08/04/2021 NEGATIVE  NEGATIVE Final   Comment: (NOTE) SARS-CoV-2 target nucleic acids are NOT DETECTED.  The SARS-CoV-2 RNA is generally detectable in upper respiratory specimens during the acute phase of infection. The lowest concentration of SARS-CoV-2 viral copies this assay can detect is 138 copies/mL. A negative result does not preclude SARS-Cov-2 infection and should not be used as the sole basis for treatment or other patient management decisions. A negative result may occur with  improper specimen collection/handling, submission of specimen other than nasopharyngeal swab, presence of viral mutation(s) within the areas targeted by this assay, and inadequate number of viral copies(<138 copies/mL). A negative result must be combined with clinical observations,  patient history, and epidemiological information. The expected result is Negative.  Fact Sheet for Patients:  EntrepreneurPulse.com.au  Fact Sheet for Healthcare Providers:  IncredibleEmployment.be  This test is no                          t yet approved or cleared by the [place] FDA and  has been authorized for detection and/or diagnosis of SARS-CoV-2 by FDA under an Emergency Use Authorization (EUA). This EUA will remain  in effect (meaning this test can be used) for the duration of the COVID-19 declaration under Section 564(b)(1) of the Act, 21 U.S.C.section 360bbb-3(b)(1), unless the authorization is terminated  or revoked sooner.       Influenza A by PCR 08/04/2021 NEGATIVE  NEGATIVE Final   Influenza B by PCR 08/04/2021 NEGATIVE  NEGATIVE Final   Comment: (NOTE) The Xpert Xpress SARS-CoV-2/FLU/RSV plus assay is intended as an aid in the diagnosis of influenza from Nasopharyngeal swab specimens and should not be used as a sole basis for treatment. Nasal washings and aspirates are unacceptable for Xpert Xpress SARS-CoV-2/FLU/RSV testing.  Fact Sheet for Patients: EntrepreneurPulse.com.au  Fact Sheet for Healthcare Providers: IncredibleEmployment.be  This test is not yet approved or cleared by the [place] FDA and has been authorized for detection and/or diagnosis of SARS-CoV-2 by FDA under an Emergency Use Authorization (EUA). This EUA will remain in effect (meaning this test can be used) for the duration of the COVID-19 declaration under Section 564(b)(1) of the Act, 21 U.S.C. section 360bbb-3(b)(1), unless the authorization is terminated or revoked.  Performed at [facility], [place] [address] [place], [place] [address]    SARS Coronavirus 2 Ag 08/04/2021 Negative  Negative Final   POC Amphetamine UR 08/04/2021 None Detected  NONE DETECTED (Cut Off Level 1000 ng/mL) Final   POC  Secobarbital (BAR) 08/04/2021 None Detected  NONE DETECTED (Cut Off Level 300 ng/mL) Final   POC Buprenorphine (BUP) 08/04/2021 None Detected  NONE DETECTED (Cut Off Level 10 ng/mL) Final   POC Oxazepam (BZO) 08/04/2021 None Detected  NONE DETECTED (Cut Off Level 300 ng/mL) Final   POC Cocaine UR 08/04/2021 None  Detected  NONE DETECTED (Cut Off Level 300 ng/mL) Final   POC Methamphetamine UR 08/04/2021 None Detected  NONE DETECTED (Cut Off Level 1000 ng/mL) Final   POC Morphine 08/04/2021 None Detected  NONE DETECTED (Cut Off Level 300 ng/mL) Final   POC Oxycodone UR 08/04/2021 None Detected  NONE DETECTED (Cut Off Level 100 ng/mL) Final   POC Methadone UR 08/04/2021 None Detected  NONE DETECTED (Cut Off Level 300 ng/mL) Final   POC Marijuana UR 08/04/2021 Positive (A)  NONE DETECTED (Cut Off Level 50 ng/mL) Final   Alcohol, Ethyl (B) 08/04/2021 <10  <10 mg/dL Final   Comment: (NOTE) Lowest detectable limit for serum alcohol is 10 mg/dL.  For medical purposes only. Performed at [facility], [place] [address] [place], [place] [address]    SARSCOV2ONAVIRUS 2 AG 08/04/2021 NEGATIVE  NEGATIVE Final   Comment: (NOTE) SARS-CoV-2 antigen NOT DETECTED.   Negative results are presumptive.  Negative results do not preclude SARS-CoV-2 infection and should not be used as the sole basis for treatment or other patient management decisions, including infection  control decisions, particularly in the presence of clinical signs and  symptoms consistent with COVID-19, or in those who have been in contact with the virus.  Negative results must be combined with clinical observations, patient history, and epidemiological information. The expected result is Negative.  Fact Sheet for Patients: HandmadeRecipes.com.cy  Fact Sheet for Healthcare Providers: FuneralLife.at  This test is not yet approved or cleared by the [place] FDA and  has been  authorized for detection and/or diagnosis of SARS-CoV-2 by FDA under an Emergency Use Authorization (EUA).  This EUA will remain in effect (meaning this test can be used) for the duration of  the COV                          ID-19 declaration under Section 564(b)(1) of the Act, 21 U.S.C. section 360bbb-3(b)(1), unless the authorization is terminated or revoked sooner.    Admission on 07/29/2021, Discharged on 08/04/2021  Component Date Value Ref Range Status   SARS Coronavirus 2 by RT PCR 07/29/2021 NEGATIVE  NEGATIVE Final   Comment: (NOTE) SARS-CoV-2 target nucleic acids are NOT DETECTED.  The SARS-CoV-2 RNA is generally detectable in upper respiratory specimens during the acute phase of infection. The lowest concentration of SARS-CoV-2 viral copies this assay can detect is 138 copies/mL. A negative result does not preclude SARS-Cov-2 infection and should not be used as the sole basis for treatment or other patient management decisions. A negative result may occur with  improper specimen collection/handling, submission of specimen other than nasopharyngeal swab, presence of viral mutation(s) within the areas targeted by this assay, and inadequate number of viral copies(<138 copies/mL). A negative result must be combined with clinical observations, patient history, and epidemiological information. The expected result is Negative.  Fact Sheet for Patients:  EntrepreneurPulse.com.au  Fact Sheet for Healthcare Providers:  IncredibleEmployment.be  This test is no                          t yet approved or cleared by the [place] FDA and  has been authorized for detection and/or diagnosis of SARS-CoV-2 by FDA under an Emergency Use Authorization (EUA). This EUA will remain  in effect (meaning this test can be used) for the duration of the COVID-19 declaration under Section 564(b)(1) of the Act, 21 U.S.C.section 360bbb-3(b)(1), unless the  authorization is terminated  or revoked sooner.       Influenza A by PCR 07/29/2021 NEGATIVE  NEGATIVE Final   Influenza B by PCR 07/29/2021 NEGATIVE  NEGATIVE Final   Comment: (NOTE) The Xpert Xpress SARS-CoV-2/FLU/RSV plus assay is intended as an aid in the diagnosis of influenza from Nasopharyngeal swab specimens and should not be used as a sole basis for treatment. Nasal washings and aspirates are unacceptable for Xpert Xpress SARS-CoV-2/FLU/RSV testing.  Fact Sheet for Patients: EntrepreneurPulse.com.au  Fact Sheet for Healthcare Providers: IncredibleEmployment.be  This test is not yet approved or cleared by the [place] FDA and has been authorized for detection and/or diagnosis of SARS-CoV-2 by FDA under an Emergency Use Authorization (EUA). This EUA will remain in effect (meaning this test can be used) for the duration of the COVID-19 declaration under Section 564(b)(1) of the Act, 21 U.S.C. section 360bbb-3(b)(1), unless the authorization is terminated or revoked.  Performed at [facility], [place] [address] [place], [place] [address]    WBC 07/29/2021 4.8  4.0 - 10.5 K/uL Final   RBC 07/29/2021 5.45  4.22 - 5.81 MIL/uL Final   Hemoglobin 07/29/2021 16.5  13.0 - 17.0 g/dL Final   HCT 07/29/2021 48.6  39.0 - 52.0 % Final   MCV 07/29/2021 89.2  80.0 - 100.0 fL Final   MCH 07/29/2021 30.3  26.0 - 34.0 pg Final   MCHC 07/29/2021 34.0  30.0 - 36.0 g/dL Final   RDW 07/29/2021 15.0  11.5 - 15.5 % Final   Platelets 07/29/2021 289  150 - 400 K/uL Final   nRBC 07/29/2021 0.0  0.0 - 0.2 % Final   Neutrophils Relative % 07/29/2021 52  % Final   Neutro Abs 07/29/2021 2.5  1.7 - 7.7 K/uL Final   Lymphocytes Relative 07/29/2021 40  % Final   Lymphs Abs 07/29/2021 2.0  0.7 - 4.0 K/uL Final   Monocytes Relative 07/29/2021 6  % Final   Monocytes Absolute 07/29/2021 0.3  0.1 - 1.0 K/uL Final   Eosinophils Relative 07/29/2021 2  % Final    Eosinophils Absolute 07/29/2021 0.1  0.0 - 0.5 K/uL Final   Basophils Relative 07/29/2021 0  % Final   Basophils Absolute 07/29/2021 0.0  0.0 - 0.1 K/uL Final   Immature Granulocytes 07/29/2021 0  % Final   Abs Immature Granulocytes 07/29/2021 0.02  0.00 - 0.07 K/uL Final   Performed at [facility], [place] [address] [place], [place] [address]   Sodium 07/29/2021 139  135 - 145 mmol/L Final   Potassium 07/29/2021 3.8  3.5 - 5.1 mmol/L Final   Chloride 07/29/2021 99  98 - 111 mmol/L Final   CO2 07/29/2021 25  22 - 32 mmol/L Final   Glucose, Bld 07/29/2021 74  70 - 99 mg/dL Final   Glucose reference range applies only to samples taken after fasting for at least 8 hours.   BUN 07/29/2021 8  8 - 23 mg/dL Final   Creatinine, Ser 07/29/2021 0.75  0.61 - 1.24 mg/dL Final   Calcium 07/29/2021 9.2  8.9 - 10.3 mg/dL Final   Total Protein 07/29/2021 7.1  6.5 - 8.1 g/dL Final   Albumin 07/29/2021 4.2  3.5 - 5.0 g/dL Final   AST 07/29/2021 55 (H)  15 - 41 U/L Final   ALT 07/29/2021 50 (H)  0 - 44 U/L Final   Alkaline Phosphatase 07/29/2021 61  38 - 126 U/L Final   Total Bilirubin 07/29/2021  1.1  0.3 - 1.2 mg/dL Final   GFR, Estimated 07/29/2021 >60  >60 mL/min Final   Comment: (NOTE) Calculated using the CKD-EPI Creatinine Equation (2021)    Anion gap 07/29/2021 15  5 - 15 Final   Performed at [facility], [place] [address], [place], [place] [address]   Hgb A1c MFr Bld 07/29/2021 5.4  4.8 - 5.6 % Final   Comment: (NOTE) Pre diabetes:          5.7%-6.4%  Diabetes:              >6.4%  Glycemic control for   <7.0% adults with diabetes    Mean Plasma Glucose 07/29/2021 108.28  mg/dL Final   Performed at [facility], [place] [address] [place], [place] [address]   Magnesium 07/29/2021 1.6 (L)  1.7 - 2.4 mg/dL Final   Performed at [place] [address], [place], [place] [address]   Alcohol, Ethyl (B) 07/29/2021 277 (H)  <10 mg/dL Final   Comment: (NOTE) Lowest  detectable limit for serum alcohol is 10 mg/dL.  For medical purposes only. Performed at [facility], [place] [address], [place], [place] [address]    Cholesterol 07/29/2021 259 (H)  0 - 200 mg/dL Final   Triglycerides 07/29/2021 62  <150 mg/dL Final   HDL 07/29/2021 >135  >40 mg/dL Final   Total CHOL/HDL Ratio 07/29/2021 NOT CALCULATED  RATIO Final   VLDL 07/29/2021 12  0 - 40 mg/dL Final   LDL Cholesterol 07/29/2021 NOT CALCULATED  0 - 99 mg/dL Final   Performed at [facility], [place] [address], [place], [place] [address]   TSH 07/29/2021 0.545  0.350 - 4.500 uIU/mL Final   Comment: Performed by a 3rd Generation assay with a functional sensitivity of <=0.01 uIU/mL. Performed at [facility], [place] [address] [place], [place] [address]    Color, Urine 07/29/2021 YELLOW  YELLOW Final   APPearance 07/29/2021 CLEAR  CLEAR Final   Specific Gravity, Urine 07/29/2021 1.005  1.005 - 1.030 Final   pH 07/29/2021 5.0  5.0 - 8.0 Final   Glucose, UA 07/29/2021 NEGATIVE  NEGATIVE mg/dL Final   Hgb urine dipstick 07/29/2021 NEGATIVE  NEGATIVE Final   Bilirubin Urine 07/29/2021 NEGATIVE  NEGATIVE Final   Ketones, ur 07/29/2021 NEGATIVE  NEGATIVE mg/dL Final   Protein, ur 07/29/2021 NEGATIVE  NEGATIVE mg/dL Final   Nitrite 07/29/2021 NEGATIVE  NEGATIVE Final   Leukocytes,Ua 07/29/2021 NEGATIVE  NEGATIVE Final   WBC, UA 07/29/2021 0-5  0 - 5 WBC/hpf Final   Bacteria, UA 07/29/2021 NONE SEEN  NONE SEEN Final   Performed at [facility], [place] [address], [place], [place] [address]   POC Amphetamine UR 07/29/2021 None Detected  NONE DETECTED (Cut Off Level 1000 ng/mL) Final   POC Secobarbital (BAR) 07/29/2021 None Detected  NONE DETECTED (Cut Off Level 300 ng/mL) Final   POC Buprenorphine (BUP) 07/29/2021 None Detected  NONE DETECTED (Cut Off Level 10 ng/mL) Final   POC Oxazepam (BZO) 07/29/2021 None Detected  NONE DETECTED (Cut Off Level 300 ng/mL) Final   POC Cocaine UR  07/29/2021 None Detected  NONE DETECTED (Cut Off Level 300 ng/mL) Final   POC Methamphetamine UR 07/29/2021 None Detected  NONE DETECTED (Cut Off Level 1000 ng/mL) Final   POC Morphine 07/29/2021 None Detected  NONE DETECTED (Cut Off Level 300 ng/mL) Final   POC Oxycodone UR 07/29/2021 None Detected  NONE DETECTED (Cut Off Level 100 ng/mL) Final   POC Methadone UR 07/29/2021 None Detected  NONE DETECTED (Cut Off Level 300 ng/mL) Final   POC Marijuana UR 07/29/2021 Positive (A)  NONE DETECTED (Cut Off Level 50 ng/mL) Final   SARS Coronavirus 2 Ag 07/29/2021 Negative  Negative Final   RPR Ser Ql 07/29/2021 NON REACTIVE  NON REACTIVE Final   Performed at [facility], [address] [place], [place] [address]   HIV Screen 4th Generation wRfx 07/29/2021 Non Reactive  Non Reactive Final   Performed at [facility], [address] [place], [place] [address]   SARSCOV2ONAVIRUS 2 AG 07/29/2021 NEGATIVE  NEGATIVE Final   Comment: (NOTE) SARS-CoV-2 antigen NOT DETECTED.   Negative results are presumptive.  Negative results do not preclude SARS-CoV-2 infection and should not be used as the sole basis for treatment or other patient management decisions, including infection  control decisions, particularly in the presence of clinical signs and  symptoms consistent with COVID-19, or in those who have been in contact with the virus.  Negative results must be combined with clinical observations, patient history, and epidemiological information. The expected result is Negative.  Fact Sheet for Patients: https://www.jennings-kim.com/  Fact Sheet for Healthcare Providers: https://alexander-rogers.biz/  This test is not yet approved or cleared by the [place] FDA and  has been authorized for detection and/or diagnosis of SARS-CoV-2 by FDA under an Emergency Use Authorization (EUA).  This EUA will remain in effect (meaning this test can be used) for the duration  of  the COV                          ID-19 declaration under Section 564(b)(1) of the Act, 21 U.S.C. section 360bbb-3(b)(1), unless the authorization is terminated or revoked sooner.    Admission on 07/06/2021, Discharged on 07/07/2021  Component Date Value Ref Range Status   Sodium 07/06/2021 140  135 - 145 mmol/L Final   Potassium 07/06/2021 4.1  3.5 - 5.1 mmol/L Final   Chloride 07/06/2021 106  98 - 111 mmol/L Final   CO2 07/06/2021 21 (L)  22 - 32 mmol/L Final   Glucose, Bld 07/06/2021 113 (H)  70 - 99 mg/dL Final   Glucose reference range applies only to samples taken after fasting for at least 8 hours.   BUN 07/06/2021 17  8 - 23 mg/dL Final   Creatinine, Ser 07/06/2021 1.07  0.61 - 1.24 mg/dL Final   Calcium 25/01/3975 9.0  8.9 - 10.3 mg/dL Final   Total Protein 73/41/9379 6.8  6.5 - 8.1 g/dL Final   Albumin 02/40/9735 4.0  3.5 - 5.0 g/dL Final   AST 32/99/2426 21  15 - 41 U/L Final   ALT 07/06/2021 20  0 - 44 U/L Final   Alkaline Phosphatase 07/06/2021 65  38 - 126 U/L Final   Total Bilirubin 07/06/2021 0.4  0.3 - 1.2 mg/dL Final   GFR, Estimated 07/06/2021 >60  >60 mL/min Final   Comment: (NOTE) Calculated using the CKD-EPI Creatinine Equation (2021)    Anion gap 07/06/2021 13  5 - 15 Final   Performed at [facility], [address] [place], [place] [address]   Alcohol, Ethyl (B) 07/06/2021 283 (H)  <10 mg/dL Final   Comment: (NOTE) Lowest detectable limit for serum alcohol is 10 mg/dL.  For medical purposes only. Performed at [facility], [address], [place],  [place] [id number]    Salicylate Lvl [id number] <7.0 (L)  7.0 - 30.0 mg/dL Final   Performed at [facility], [place] [address] [place], [place] [address]   Acetaminophen (Tylenol), Serum 07/06/2021 <10 (L)  10 - 30 ug/mL Final   Comment: (NOTE) Therapeutic concentrations vary significantly. A range of 10-30 ug/mL  may be an effective concentration for many patients. However, some  are  best treated at concentrations outside of this range. Acetaminophen concentrations >150 ug/mL at 4 hours after ingestion  and >50 ug/mL at 12 hours after ingestion are often associated with  toxic reactions.  Performed at [facility], [place] [address] [place], [place] [address]    Opiates 07/06/2021 NONE DETECTED  NONE DETECTED Final   Cocaine 07/06/2021 NONE DETECTED  NONE DETECTED Final   Benzodiazepines 07/06/2021 NONE DETECTED  NONE DETECTED Final   Amphetamines 07/06/2021 NONE DETECTED  NONE DETECTED Final   Tetrahydrocannabinol 07/06/2021 POSITIVE (A)  NONE DETECTED Final   Barbiturates 07/06/2021 NONE DETECTED  NONE DETECTED Final   Comment: (NOTE) DRUG SCREEN FOR MEDICAL PURPOSES ONLY.  IF CONFIRMATION IS NEEDED FOR ANY PURPOSE, NOTIFY LAB WITHIN 5 DAYS.  LOWEST DETECTABLE LIMITS FOR URINE DRUG SCREEN Drug Class                     Cutoff (ng/mL) Amphetamine and metabolites    1000 Barbiturate and metabolites    200 Benzodiazepine                 [id number] Tricyclics and metabolites     300 Opiates and metabolites        300 Cocaine and metabolites        300 THC                            50 Performed at [facility], [place] [address], [place],  [address]    SARS Coronavirus 2 by RT PCR 07/06/2021 NEGATIVE  NEGATIVE Final   Comment: (NOTE) SARS-CoV-2 target nucleic acids are NOT DETECTED.  The SARS-CoV-2 RNA is generally detectable in upper respiratory specimens during the acute phase of infection. The lowest concentration of SARS-CoV-2 viral copies this assay can detect is 138 copies/mL. A negative result does not preclude SARS-Cov-2 infection and should not be used as the sole basis for treatment or other patient management decisions. A negative result may occur with  improper specimen collection/handling, submission of specimen other than nasopharyngeal swab, presence of viral mutation(s) within the areas targeted by this assay, and inadequate  number of viral copies(<138 copies/mL). A negative result must be combined with clinical observations, patient history, and epidemiological information. The expected result is Negative.  Fact Sheet for Patients:  EntrepreneurPulse.com.au  Fact Sheet for Healthcare Providers:  IncredibleEmployment.be  This test is no                          t yet approved or cleared by the [place] FDA and  has been authorized for detection and/or diagnosis of SARS-CoV-2 by FDA under an Emergency Use Authorization (EUA). This EUA will remain  in effect (meaning this test can be used) for the duration of the COVID-19 declaration under Section 564(b)(1) of the Act, 21 U.S.C.section 360bbb-3(b)(1), unless the authorization is terminated  or revoked sooner.       Influenza A by PCR 07/06/2021 NEGATIVE  NEGATIVE  Final   Influenza B by PCR 07/06/2021 NEGATIVE  NEGATIVE Final   Comment: (NOTE) The Xpert Xpress SARS-CoV-2/FLU/RSV plus assay is intended as an aid in the diagnosis of influenza from Nasopharyngeal swab specimens and should not be used as a sole basis for treatment. Nasal washings and aspirates are unacceptable for Xpert Xpress SARS-CoV-2/FLU/RSV testing.  Fact Sheet for Patients: BloggerCourse.comhttps://www.fda.gov/media/152166/download  Fact Sheet for Healthcare Providers: SeriousBroker.ithttps://www.fda.gov/media/152162/download  This test is not yet approved or cleared by the [place] FDA and has been authorized for detection and/or diagnosis of SARS-CoV-2 by FDA under an Emergency Use Authorization (EUA). This EUA will remain in effect (meaning this test can be used) for the duration of the COVID-19 declaration under Section 564(b)(1) of the Act, 21 U.S.C. section 360bbb-3(b)(1), unless the authorization is terminated or revoked.  Performed at [facility], [address] [place], [place] [address]    WBC 07/06/2021 5.7  4.0 - 10.5 K/uL Final   RBC  07/06/2021 5.37  4.22 - 5.81 MIL/uL Final   Hemoglobin 07/06/2021 15.7  13.0 - 17.0 g/dL Final   HCT 60/45/409810/23/2022 48.5  39.0 - 52.0 % Final   MCV 07/06/2021 90.3  80.0 - 100.0 fL Final   MCH 07/06/2021 29.2  26.0 - 34.0 pg Final   MCHC 07/06/2021 32.4  30.0 - 36.0 g/dL Final   RDW 11/91/478210/23/2022 14.5  11.5 - 15.5 % Final   Platelets 07/06/2021 307  150 - 400 K/uL Final   nRBC 07/06/2021 0.0  0.0 - 0.2 % Final   Neutrophils Relative % 07/06/2021 53  % Final   Neutro Abs 07/06/2021 3.1  1.7 - 7.7 K/uL Final   Lymphocytes Relative 07/06/2021 35  % Final   Lymphs Abs 07/06/2021 2.0  0.7 - 4.0 K/uL Final   Monocytes Relative 07/06/2021 7  % Final   Monocytes Absolute 07/06/2021 0.4  0.1 - 1.0 K/uL Final   Eosinophils Relative 07/06/2021 3  % Final   Eosinophils Absolute 07/06/2021 0.2  0.0 - 0.5 K/uL Final   Basophils Relative 07/06/2021 1  % Final   Basophils Absolute 07/06/2021 0.0  0.0 - 0.1 K/uL Final   Immature Granulocytes 07/06/2021 1  % Final   Abs Immature Granulocytes 07/06/2021 0.06  0.00 - 0.07 K/uL Final   Performed at [facility], [address] [place], [place] [address]   Color, Urine 07/06/2021 STRAW (A)  YELLOW Final   APPearance 07/06/2021 CLEAR  CLEAR Final   Specific Gravity, Urine 07/06/2021 1.010  1.005 - 1.030 Final   pH 07/06/2021 5.0  5.0 - 8.0 Final   Glucose, UA 07/06/2021 NEGATIVE  NEGATIVE mg/dL Final   Hgb urine dipstick 07/06/2021 NEGATIVE  NEGATIVE Final   Bilirubin Urine 07/06/2021 NEGATIVE  NEGATIVE Final   Ketones, ur 07/06/2021 NEGATIVE  NEGATIVE mg/dL Final   Protein, ur 30/86/578410/23/2022 NEGATIVE  NEGATIVE mg/dL Final   Nitrite 69/62/952810/23/2022 NEGATIVE  NEGATIVE Final   Leukocytes,Ua 07/06/2021 NEGATIVE  NEGATIVE Final   Performed at [facility], [address] [place], [place] [address]  Admission on 02/21/2021, Discharged on 02/22/2021  Component Date Value Ref Range Status   WBC 02/21/2021 6.5  4.0 - 10.5 K/uL Final   RBC 02/21/2021 4.61  4.22 -  5.81 MIL/uL Final   Hemoglobin 02/21/2021 13.3  13.0 - 17.0 g/dL Final   HCT 40/10/272506/06/2021 40.4  39.0 - 52.0 % Final   MCV 02/21/2021 87.6  80.0 - 100.0 fL Final  MCH 02/21/2021 28.9  26.0 - 34.0 pg Final   MCHC 02/21/2021 32.9  30.0 - 36.0 g/dL Final   RDW 92/09/69 12.7  11.5 - 15.5 % Final   Platelets 02/21/2021 605 (H)  150 - 400 K/uL Final   nRBC 02/21/2021 0.0  0.0 - 0.2 % Final   Neutrophils Relative % 02/21/2021 49  % Final   Neutro Abs 02/21/2021 3.2  1.7 - 7.7 K/uL Final   Lymphocytes Relative 02/21/2021 43  % Final   Lymphs Abs 02/21/2021 2.8  0.7 - 4.0 K/uL Final   Monocytes Relative 02/21/2021 6  % Final   Monocytes Absolute 02/21/2021 0.4  0.1 - 1.0 K/uL Final   Eosinophils Relative 02/21/2021 1  % Final   Eosinophils Absolute 02/21/2021 0.1  0.0 - 0.5 K/uL Final   Basophils Relative 02/21/2021 0  % Final   Basophils Absolute 02/21/2021 0.0  0.0 - 0.1 K/uL Final   Immature Granulocytes 02/21/2021 1  % Final   Abs Immature Granulocytes 02/21/2021 0.04  0.00 - 0.07 K/uL Final   Performed at [facility], [address], [place], [place] [address]   Sodium 02/21/2021 140  135 - 145 mmol/L Final   Potassium 02/21/2021 3.2 (L)  3.5 - 5.1 mmol/L Final   Chloride 02/21/2021 105  98 - 111 mmol/L Final   CO2 02/21/2021 22  22 - 32 mmol/L Final   Glucose, Bld 02/21/2021 102 (H)  70 - 99 mg/dL Final   Glucose reference range applies only to samples taken after fasting for at least 8 hours.   BUN 02/21/2021 10  8 - 23 mg/dL Final   Creatinine, Ser 02/21/2021 0.80  0.61 - 1.24 mg/dL Final   Calcium 88/32/5498 9.5  8.9 - 10.3 mg/dL Final   GFR, Estimated 02/21/2021 >60  >60 mL/min Final   Comment: (NOTE) Calculated using the CKD-EPI Creatinine Equation (2021)    Anion gap 02/21/2021 13  5 - 15 Final   Performed at [facility], [address] [place], [place] [address]   Specimen Description 02/22/2021    Final                   Value:JOINT  FLUID RIGHT ELBOW Performed at [facility], [address], [place], [place] [address]    Special Requests 02/22/2021    Final                   Value:Normal Performed at [facility], [address] [place], [place] [address]    Gram Stain 02/22/2021    Final                   Value:RARE WBC PRESENT,BOTH PMN AND MONONUCLEAR NO ORGANISMS SEEN    Culture 02/22/2021    Final                   Value:NO GROWTH 3 DAYS Performed at [facility], [address] [place], [place] [address]    Report Status 02/22/2021 02/25/2021 FINAL   Final   Color, Synovial 02/22/2021 RED (A)  YELLOW Final   Appearance-Synovial 02/22/2021 TURBID (A)  CLEAR Final   Crystals, Fluid 02/22/2021 EXTRACELLULAR MONOSODIUM URATE CRYSTALS   Final   WBC, Synovial 02/22/2021 7,070 (H)  0 - 200 /cu mm Final   Neutrophil, Synovial 02/22/2021 55 (H)  0 - 25 % Final   Lymphocytes-Synovial Fld 02/22/2021 6  0 -  20 % Final   Monocyte-Macrophage-Synovial Fluid 02/22/2021 39 (L)  50 - 90 % Final   Eosinophils-Synovial 02/22/2021 0  0 - 1 % Final   Other Cells-SYN 02/22/2021 OTHER CELLS IDENTIFIED AS SYNOVIAL LINING CELLS   Final   Performed at [facility] [address] [place], [place] [address]  Admission on 02/09/2021, Discharged on 02/10/2021  Component Date Value Ref Range Status   Lactic Acid, Venous 02/10/2021 1.1  0.5 - 1.9 mmol/L Final   Performed at [facility], [place] [address], [place], [place] [address]   Lactic Acid, Venous 02/09/2021 0.8  0.5 - 1.9 mmol/L Final   Performed at [place] [address] [place], [place] [address]   Sodium 02/09/2021 136  135 - 145 mmol/L Final   Potassium 02/09/2021 3.2 (L)  3.5 - 5.1 mmol/L Final   Chloride 02/09/2021 98  98 - 111 mmol/L Final   CO2 02/09/2021 27  22 - 32 mmol/L Final   Glucose, Bld 02/09/2021 103 (H)  70 - 99 mg/dL Final   Glucose reference range  applies only to samples taken after fasting for at least 8 hours.   BUN 02/09/2021 12  8 - 23 mg/dL Final   Creatinine, Ser 02/09/2021 0.81  0.61 - 1.24 mg/dL Final   Calcium 02/09/2021 9.1  8.9 - 10.3 mg/dL Final   Total Protein 02/09/2021 7.3  6.5 - 8.1 g/dL Final   Albumin 02/09/2021 3.4 (L)  3.5 - 5.0 g/dL Final   AST 02/09/2021 13 (L)  15 - 41 U/L Final   ALT 02/09/2021 13  0 - 44 U/L Final   Alkaline Phosphatase 02/09/2021 60  38 - 126 U/L Final   Total Bilirubin 02/09/2021 1.1  0.3 - 1.2 mg/dL Final   GFR, Estimated 02/09/2021 >60  >60 mL/min Final   Comment: (NOTE) Calculated using the CKD-EPI Creatinine Equation (2021)    Anion gap 02/09/2021 11  5 - 15 Final   Performed at [facility], [place] [address], [place], [place] [address]   WBC 02/09/2021 12.8 (H)  4.0 - 10.5 K/uL Final   RBC 02/09/2021 4.31  4.22 - 5.81 MIL/uL Final   Hemoglobin 02/09/2021 12.7 (L)  13.0 - 17.0 g/dL Final   HCT 02/09/2021 37.8 (L)  39.0 - 52.0 % Final   MCV 02/09/2021 87.7  80.0 - 100.0 fL Final   MCH 02/09/2021 29.5  26.0 - 34.0 pg Final   MCHC 02/09/2021 33.6  30.0 - 36.0 g/dL Final   RDW 02/09/2021 12.3  11.5 - 15.5 % Final   Platelets 02/09/2021 368  150 - 400 K/uL Final   nRBC 02/09/2021 0.0  0.0 - 0.2 % Final   Neutrophils Relative % 02/09/2021 75  % Final   Neutro Abs 02/09/2021 9.6 (H)  1.7 - 7.7 K/uL Final   Lymphocytes Relative 02/09/2021 16  % Final   Lymphs Abs 02/09/2021 2.1  0.7 - 4.0 K/uL Final   Monocytes Relative 02/09/2021 8  % Final   Monocytes Absolute 02/09/2021 1.0  0.1 - 1.0 K/uL Final   Eosinophils Relative 02/09/2021 0  % Final   Eosinophils Absolute 02/09/2021 0.0  0.0 - 0.5 K/uL Final   Basophils Relative 02/09/2021 0  % Final   Basophils Absolute 02/09/2021 0.0  0.0 - 0.1 K/uL Final   Immature Granulocytes 02/09/2021 1  % Final   Abs Immature Granulocytes 02/09/2021 0.09 (H)  0.00 - 0.07 K/uL Final  Performed at [facility],  [place] [address] [place], [place] [address]   Color, Urine 02/10/2021 YELLOW  YELLOW Final   APPearance 02/10/2021 CLEAR  CLEAR Final   Specific Gravity, Urine 02/10/2021 1.015  1.005 - 1.030 Final   pH 02/10/2021 6.0  5.0 - 8.0 Final   Glucose, UA 02/10/2021 NEGATIVE  NEGATIVE mg/dL Final   Hgb urine dipstick 02/10/2021 NEGATIVE  NEGATIVE Final   Bilirubin Urine 02/10/2021 NEGATIVE  NEGATIVE Final   Ketones, ur 02/10/2021 20 (A)  NEGATIVE mg/dL Final   Protein, ur 02/10/2021 NEGATIVE  NEGATIVE mg/dL Final   Nitrite 02/10/2021 NEGATIVE  NEGATIVE Final   Leukocytes,Ua 02/10/2021 NEGATIVE  NEGATIVE Final   Performed at [facility] [address], [place], [place] [address]   Specimen Description 02/09/2021    Final                   Value:BLOOD LEFT ANTECUBITAL Performed at [facility] [address], [place], [place] [address]    Special Requests 02/09/2021    Final                   Value:BOTTLES DRAWN AEROBIC AND ANAEROBIC Blood Culture adequate volume Performed at [place] [address] [place], [place] [address]    Culture 02/09/2021    Final                   Value:NO GROWTH 5 DAYS Performed at [place] [address], [place], [place] [address]    Report Status 02/09/2021 02/15/2021 FINAL   Final   Specimen Description 02/09/2021    Final                   Value:BLOOD LEFT ANTECUBITAL Performed at [facility], [place] [address] [place], [place] [address]    Special Requests 02/09/2021    Final                   Value:BOTTLES DRAWN AEROBIC AND ANAEROBIC Blood Culture adequate volume Performed at [place] [address], [place], [place] [address]    Culture 02/09/2021    Final                   Value:NO GROWTH 5 DAYS Performed at [facility] [address] [place], [place] [address]    Report Status 02/09/2021 02/15/2021 FINAL   Final   Sed Rate  02/09/2021 89 (H)  0 - 16 mm/hr Final   Performed at [facility], [place] [address], [place], [place] [address]   CRP 02/09/2021 14.7 (H)  <1.0 mg/dL Final   Performed at [place] [address], [place],  [address]    Blood Alcohol level:  Lab Results  Component Value Date   ETH <10 08/04/2021   ETH 277 (H) [id number]    Metabolic Disorder Labs: Lab Results  Component Value Date   HGBA1C 5.4 07/29/2021   MPG 108.28 07/29/2021   MPG 108.28 11/26/2020   No results found for: PROLACTIN Lab Results  Component Value Date   CHOL 259 (H) 07/29/2021   TRIG 62 07/29/2021   HDL >135 07/29/2021   CHOLHDL NOT CALCULATED 07/29/2021   VLDL 12 07/29/2021   LDLCALC NOT CALCULATED 07/29/2021    Therapeutic Lab Levels: No results found for: LITHIUM No results found for: VALPROATE No components found  for:  CBMZ  Physical Findings   Flowsheet Row ED from 08/04/2021 in [facility] ED from 07/29/2021 in [facility] ED from 07/06/2021 in [facility] Error: Q3, 4, or 5 should not be populated when Q2 is No Low Risk High Risk        Musculoskeletal  Strength & Muscle Tone: within normal limits Gait & Station: normal Patient leans: N/A  Psychiatric Specialty Exam  Presentation  General Appearance: Appropriate for Environment  Eye Contact:Good  Speech:Clear and Coherent; Normal Rate  Speech Volume:Normal  Handedness:Right   Mood and Affect  Mood:Anxious; Dysphoric  Affect:Congruent   Thought Process  Thought Processes:Coherent; Goal Directed  Descriptions of Associations:Intact  Orientation:Full (Time, Place and Person)  Thought Content:WDL  Diagnosis of Schizophrenia or Schizoaffective disorder in past: No    Hallucinations:Hallucinations: None  Ideas of Reference:None  Suicidal Thoughts:Suicidal  Thoughts: Yes, Passive SI Active Intent and/or Plan: Without Intent; Without Plan (Passive suicidal thoughts more condictional related to alcohol use disorder and not being able to get into treatment) SI Passive Intent and/or Plan: Without Intent; Without Plan  Homicidal Thoughts:Homicidal Thoughts: No   Sensorium  Memory:Immediate Good; Recent Good; Remote Good  Judgment:Fair  Insight:Fair; Present   Executive Functions  Concentration:Good  Attention Span:Good  [place] of Knowledge:Good  Language:Good   Psychomotor Activity  Psychomotor Activity:Psychomotor Activity: Normal   Assets  Assets:Communication Skills; Desire for Improvement; Housing; Resilience; Social Support   Sleep  Sleep:Sleep: Good   Nutritional Assessment (For OBS and FBC admissions only) Has the patient had a weight loss or gain of 10 pounds or more in the last 3 months?: No Has the patient had a decrease in food intake/or appetite?: No Does the patient have dental problems?: No Does the patient have eating habits or behaviors that may be indicators of an eating disorder including binging or inducing vomiting?: No Has the patient recently lost weight without trying?: 0 Has the patient been eating poorly because of a decreased appetite?: 0 Malnutrition Screening Tool Score: 0 Nutritional Assessment Referrals: Other (comment) (Alcohol detox and treatment)    Physical Exam  Physical Exam Vitals and nursing note reviewed. Exam conducted with a chaperone present.  Constitutional:      General: He is not in acute distress.    Appearance: Normal appearance. He is not ill-appearing.  Cardiovascular:     Rate and Rhythm: Normal rate.  Pulmonary:     Effort: Pulmonary effort is normal.  Musculoskeletal:        General: Normal range of motion.     Cervical back: Normal range of motion.  Skin:    General: Skin is warm and dry.  Neurological:     Mental Status: He is alert and  oriented to person, place, and time.  Psychiatric:        Attention and Perception: Attention and perception normal. He does not perceive auditory or visual hallucinations.        Mood and Affect: Affect normal. Mood is anxious.        Speech: Speech normal.        Behavior: Behavior normal. Behavior is cooperative.        Thought Content: Thought content is not paranoid or delusional. Thought content does not include homicidal ideation. Suicidal: Continues to report some passive suicidal thoughts if unable to get into a rehab treatment program.  No intent or plan voiced.Thought content  does not include suicidal plan.        Cognition and Memory: Cognition and memory normal.        Judgment: Judgment normal.   Review of Systems  Constitutional: Negative.   HENT: Negative.    Eyes: Negative.   Respiratory: Negative.    Cardiovascular: Negative.   Gastrointestinal: Negative.   Genitourinary: Negative.   Musculoskeletal: Negative.   Skin: Negative.   Neurological: Negative.   Endo/Heme/Allergies: Negative.   Psychiatric/Behavioral:  Positive for substance abuse (Alcohol). Depression: Stable. Hallucinations: Denies. Suicidal ideas: Passive thoughts no plan or intent.Nervous/anxious: Stable.   Blood pressure 127/79, pulse 64, temperature 98.7 F (37.1 C), temperature source Oral, resp. rate 18, SpO2 98 %. There is no height or weight on file to calculate BMI.  Treatment Plan Summary: Daily contact with patient to assess and evaluate symptoms and progress in treatment, Medication management, and Plan social work to continue working with patient to assist with bed availability at rehab facilities.  If unable to get into a rehab facility patient will more likely need inpatient psychiatric treatment related to his inability to contract for safety and passive suicidal thoughts.  Deryk Bozman, NP 08/06/2021 10:18 AM
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
## 2021-08-06 NOTE — [facility] Group Notes (Signed)
[name] Group Notes:  (Nursing/MHT/Case Management/Adjunct)  Date:  08/06/2021  Time:  8:11 PM  Type of Therapy:   NA Group  Participation Level:  Active  Participation Quality:  Appropriate and Attentive  Affect:  Appropriate  Cognitive:  Appropriate  Insight:  Appropriate, Good, and Improving  Engagement in Group:  Developing/Improving  Modes of Intervention:  Discussion  Summary of Progress/Problems: PT is in a very happy mood and was attentive and appropriate.  Lorita Officer 08/06/2021, 8:11 PM

## 2021-08-07 DIAGNOSIS — F332 Major depressive disorder, recurrent severe without psychotic features: Secondary | ICD-10-CM | POA: Diagnosis not present

## 2021-08-07 DIAGNOSIS — F1020 Alcohol dependence, uncomplicated: Secondary | ICD-10-CM

## 2021-08-07 DIAGNOSIS — F411 Generalized anxiety disorder: Secondary | ICD-10-CM

## 2021-08-07 NOTE — H&P (Signed)
Psychiatric Admission Assessment Adult  Patient Identification: Willie Collins  MRN:  [id number]  Date of Evaluation:  08/07/2021  Chief Complaint: Passive suicidal ideations, worsening alcohol use needing a long term substance abuse treatment program.  Principal Diagnosis: MDD (major depressive disorder), recurrent severe, without psychosis (HCC)  Diagnosis:  Principal Problem:   MDD (major depressive disorder), recurrent severe, without psychosis (HCC)  History of Present Illness: This is [facility] for this 60 year old Hispanic male with hx of alcohol use disorder & major depressive disorder. [facility] with complain of passive suicidal ideations due to [facility] due to insurance issues. Per chart review, [facility] with complain of alcohol intoxication, inability to stop drinking heavily triggering suicidal ideations. And [facility] he received alcohol detoxification treatments using the [facility] protocols. After detox, [facility] to continue further substance abuse treatment. However, [facility], he was [place] [facility] citing passive suicidal thoughts. [facility] & treatment. During this evaluation, [name] reports,  "[facility] The police took me there. It was my substance abuse counselor [name]([name]) from [name] who called the cops. On that day, I have been drinking a lot of liquor, I lost control, couldn't stop myself. I got to feeling overwhelmed & a lot of guilt, start having bad thoughts like I'm going hurt myself. I called my substance abuse counselor, Raiford NobleRick & told him how I was feeling. He called the cops for me.  [facility] they were giving me Ativan for the withdrawal symptoms. I have been drinking alcohol since I was 60 years old. I have been going to the [name] treatment program for my alcoholism in the last 5 months. I go there for 2 hours every Thursday. But I'm still drinking too much. I know now that I'm not going to quit drinking going to an outpatient treatment program. I need a residential treatment program. I need help getting into one.  I have never had any alcohol withdrawal seizures or DTs. I was feeling depressed, but not any more.. I'm feeling much better. I have had thoughts of hurting myself in the past & planned to poison myself with pills, but I have never attempted suicide. The first time I had bad thoughts was because I was feeling very depressed. I was taken to the hospital & I stayed for 10 days. They treated me with Sertraline & Seroquel. I took them for a while & stopped. But, I'm back on Zoloft & Trazodone is helping me sleep well at night. I feel good now, I just want to go to a long term substance abuse program from here. I'm no longer feeling like hurting myself. I do not hear voices or see things">  Patient also denies any AVH, delusional thoughts or paranoia.  Associated Signs/Symptoms:  Depression Symptoms:   Currently denies any symptoms of depression or anxiety.  Duration of Depression Symptoms: Less than two weeks  (Hypo) Manic Symptoms:  Impulsivity,  Anxiety Symptoms:   Currently denies any symptoms of anxiety.  Psychotic Symptoms:   Denies any hallucinations, delusions or paranoia.  PTSD Symptoms: NA  Total  Time spent with patient: 1 hour  Past Psychiatric History: Major depressive disorder.  Is the patient at risk to self? No.  Has the patient been a risk to self in the past 6 months? Yes.    Has the patient been a risk to self within the distant past? Yes.    Is the patient a risk to others? No.  Has the patient been a risk to others in the  past 6 months? No.  Has the patient been a risk to others within the distant past? No.   Prior Inpatient Therapy: Yes Prior Outpatient Therapy: Yes, [name] substance abuse counseling services.  Alcohol Screening: 1. How often do you have a drink containing alcohol?: 4 or more times a week 2. How many drinks containing alcohol do you have on a typical day when you are drinking?: 7, 8, or 9 3. How often do you have six or more drinks on one occasion?: Daily or almost daily AUDIT-C Score: 11 4. How often during the last year have you found that you were not able to stop drinking once you had started?: Daily or almost daily 5. How often during the last year have you failed to do what was normally expected from you because of drinking?: Daily or almost daily 6. How often during the last year have you needed a first drink in the morning to get yourself going after a heavy drinking session?: Daily or almost daily 7. How often during the last year have you had a feeling of guilt of remorse after drinking?: Daily or almost daily 8. How often during the last year have you been unable to remember what happened the night before because you had been drinking?: Daily or almost daily 9. Have you or someone else been injured as a result of your drinking?: Yes, but not in the last year 10. Has a relative or friend or a doctor or another health worker been concerned about your drinking or suggested you cut down?: Yes, during the last year Alcohol Use Disorder Identification Test Final Score (AUDIT): 37 Alcohol Brief Interventions/Follow-up: Alcohol education/Brief advice  Substance Abuse History in the last 12 months:  Yes.    Consequences of Substance Abuse: Discussed witg patient during this admission evaluation. Medical Consequences:  Liver damage, Possible death by overdose Legal Consequences:  Arrests, jail time, Loss of driving privilege. Family Consequences:  Family discord, divorce and or separation.    Previous Psychotropic Medications:  "Yes, Seroquel & Zoloft).  Psychological Evaluations: No   Past Medical History:  Past Medical History:  Diagnosis Date   Hypertension     Past Surgical History:  Procedure Laterality Date   IRRIGATION AND DEBRIDEMENT FOOT Left 11/25/2020   Procedure: IRRIGATION AND DEBRIDEMENT FOOT;  Surgeon: Felipa Furnace, DPM;  Location: [facility];  Service: Podiatry;  Laterality: Left;   Family History: History reviewed. No pertinent family history.  Family Psychiatric  History: Bipolar disorder: Sister.                                                 Schizophrenia: Sister.  Tobacco Screening: Denies cigarette smoking or use of tobacco products.  Social History: Single, has 4 children, lives in [place], [place], currently unemployed. On a 3-year probation for possession of drugs. Has 3 months left to complete probation.  Social History   Substance  and Sexual Activity  Alcohol Use Yes   Comment: 1/5 liquor per day     Social History   Substance and Sexual Activity  Drug Use Never    Additional Social History:  Allergies:  No Known Allergies  Lab Results: No results found for this or any previous visit (from the past 48 hour(s)).  Blood Alcohol level:  Lab Results  Component Value Date   ETH <10 08/04/2021   ETH 277 (H) [id number]   Metabolic Disorder Labs:  Lab Results  Component Value Date   HGBA1C 5.4 07/29/2021   MPG 108.28 07/29/2021   MPG 108.28 11/26/2020   No results found for: PROLACTIN Lab Results  Component Value Date   CHOL 259 (H) 07/29/2021   TRIG 62 07/29/2021   HDL >135 07/29/2021   CHOLHDL NOT CALCULATED 07/29/2021   VLDL 12 07/29/2021   LDLCALC NOT CALCULATED 07/29/2021   Current Medications: Current Facility-Administered Medications  Medication Dose Route Frequency Provider Last Rate Last Admin   alum & mag hydroxide-simeth (MAALOX/MYLANTA) 200-200-20 MG/5ML suspension 30 mL  30 mL Oral Q4H PRN Rankin, Shuvon B,  NP       amLODipine (NORVASC) tablet 5 mg  5 mg Oral Daily Rankin, Shuvon B, NP   5 mg at 08/07/21 0900   gabapentin (NEURONTIN) capsule 200 mg  200 mg Oral TID Rankin, Shuvon B, NP   200 mg at 08/07/21 0900   hydrOXYzine (ATARAX/VISTARIL) tablet 25 mg  25 mg Oral TID PRN Rankin, Shuvon B, NP       indomethacin (INDOCIN) capsule 25 mg  25 mg Oral TID PRN Rankin, Shuvon B, NP   25 mg at 08/06/21 2131   magnesium hydroxide (MILK OF MAGNESIA) suspension 30 mL  30 mL Oral Daily PRN Rankin, Shuvon B, NP       sertraline (ZOLOFT) tablet 50 mg  50 mg Oral Daily Rankin, Shuvon B, NP   50 mg at 08/07/21 0900   traZODone (DESYREL) tablet 50 mg  50 mg Oral QHS PRN Margorie John W, PA-C   50 mg at 08/06/21 2126   PTA Medications: Medications Prior to Admission  Medication Sig Dispense Refill Last Dose   amLODipine (NORVASC) 5 MG tablet Take 1 tablet (5 mg total) by mouth daily. 14 tablet 1    gabapentin (NEURONTIN) 100 MG capsule Take 2 capsules (200 mg total) by mouth 3 (three) times daily. 180 capsule 1    hydrOXYzine (ATARAX/VISTARIL) 25 MG tablet Take 1 tablet (25 mg total) by mouth 3 (three) times daily as needed for anxiety. 30 tablet 0    indomethacin (INDOCIN) 25 MG capsule Take 1 capsule (25 mg total) by mouth 3 (three) times daily as needed for moderate pain or mild pain (gout pain). 90 capsule 1    sertraline (ZOLOFT) 50 MG tablet Take 1 tablet (50 mg total) by mouth daily. 30 tablet 1    traZODone (DESYREL) 50 MG tablet Take 1 tablet (50 mg total) by mouth at bedtime as needed for sleep. 14 tablet 0    Musculoskeletal: Strength & Muscle Tone: within normal limits Gait & Station: normal Patient leans: N/A  Psychiatric Specialty Exam:  Presentation  General Appearance: Appropriate for Environment  Eye Contact:Good  Speech:Clear and Coherent; Normal Rate  Speech Volume:Normal  Handedness:Right  Mood and Affect  Mood:Anxious; Dysphoric  Affect:Congruent  Thought Process  Thought  Processes:Coherent; Goal Directed  Duration of Psychotic Symptoms: N/A  Past Diagnosis of Schizophrenia or Psychoactive disorder: No  Descriptions of Associations:Intact  Orientation:Full (Time, Place and Person)  Thought Content:WDL  Hallucinations:Hallucinations: None  Ideas of Reference:None  Suicidal Thoughts:Suicidal Thoughts: Yes, Passive SI Passive Intent and/or Plan: Without Intent; Without Plan  Homicidal Thoughts:Homicidal Thoughts: No  Sensorium  Memory:Immediate Good; Recent Good; Remote Good  Judgment:Fair  Insight:Fair; Present  Executive Functions  Concentration:Good  Attention Span:Good  [place] of Knowledge:Good  Language:Good  Psychomotor Activity  Psychomotor Activity:Psychomotor Activity: Normal  Assets  Assets:Communication Skills; Desire for Improvement; Housing; Resilience; Social Support  Sleep  Sleep:Sleep: Good  Physical Exam: Physical Exam Vitals and nursing note reviewed.  HENT:     Head: Normocephalic.     Nose: Nose normal.     Mouth/Throat:     Pharynx: Oropharynx is clear.  Eyes:     Pupils: Pupils are equal, round, and reactive to light.  Cardiovascular:     Rate and Rhythm: Normal rate.     Pulses: Normal pulses.  Pulmonary:     Effort: Pulmonary effort is normal.  Genitourinary:    Comments: Deferred Musculoskeletal:        General: Normal range of motion.     Cervical back: Normal range of motion.  Skin:    General: Skin is warm and dry.  Neurological:     General: No focal deficit present.     Mental Status: He is alert and oriented to person, place, and time.   Review of Systems  Constitutional:  Negative for chills, fever and malaise/fatigue.  HENT:  Negative for congestion and sore throat.   Eyes:  Negative for blurred vision.  Respiratory:  Negative for cough, shortness of breath and wheezing.   Cardiovascular:  Negative for chest pain and palpitations.  Gastrointestinal:  Negative for  abdominal pain, constipation, diarrhea, heartburn, nausea and vomiting.  Genitourinary:  Negative for dysuria.  Musculoskeletal:  Negative for joint pain and myalgias.  Skin: Negative.   Neurological:  Negative for dizziness, tingling, tremors, sensory change, speech change, focal weakness, seizures, loss of consciousness, weakness and headaches.  Endo/Heme/Allergies:        Allergies: NKDA  Psychiatric/Behavioral:  Positive for depression. Negative for suicidal ideas.   Blood pressure 122/68, pulse 77, temperature 98.3 F (36.8 C), temperature source Oral, resp. rate 18, height 5\' 6"  (1.676 m), weight 65.3 kg, SpO2 97 %. Body mass index is 23.24 kg/m.  Treatment Plan Summary: Daily contact with patient to assess and evaluate symptoms and progress in treatment and Medication management.   Treatment Plan/Recommendations: 1. Admit for crisis management and stabilization, estimated length of stay 3-5 days.   Diagnoses.  Alcohol use disorder. Major depressive disorder.  High blood pressure.  Gouty arthritis.   2. Medication management to reduce current symptoms to base line and improve the patient's overall level of functioning: See [facility] for plan of care.  Alcohol withdrawal syndrome.  Continue gabapentin 200 mg po tid.  Major depression.  Continue Sertraline  50 mg po daily.  Anxiety.  Continue Hydroxyzine 25 mg po tid prn.  Insomnia.  Continue Trazodone 50 mg po daily prn.  Other medical issues, continue, amlodipine 5 mg po daily for HTN. Indocin 25 mg po tid prn for gout.  Mylanta 30 ml po Q 4 hrs prn for indigestion.  MOM 30 ml po daily prn for constipation.  3. Treat health problems as indicated.   4. Develop treatment plan to decrease risk of relapse upon discharge and the need for readmission.  5. Psycho-social education regarding relapse prevention  and self care.  6. Health care follow up as needed for medical problems.  7. Review, reconcile, and reinstate any  pertinent home medications for other health issues where appropriate. 8. Call for consults with hospitalist for any additional specialty patient care services as needed.   Observation Level/Precautions:  15 minute checks  Laboratory:   Per ED, UDS (+) for THC, BAL 277  Psychotherapy: Group sessions  Medications: See MAR  Consultations: As needed   Discharge Concerns: Safety, mood stability, maintaining sobriety.  Estimated LOS: 2-4 days  Other: Admitted to the [facility].   Physician Treatment Plan for Primary Diagnosis: MDD (major depressive disorder), recurrent severe, without psychosis (HCC) Long Term Goal(s): Improvement in symptoms so as ready for discharge  Short Term Goals: Ability to identify changes in lifestyle to reduce recurrence of condition will improve, Ability to verbalize feelings will improve, Ability to disclose and discuss suicidal ideas, and Ability to demonstrate self-control will improve  Physician Treatment Plan for Secondary Diagnosis: Principal Problem:   MDD (major depressive disorder), recurrent severe, without psychosis (HCC)  Long Term Goal(s): Improvement in symptoms so as ready for discharge  Short Term Goals: Ability to identify and develop effective coping behaviors will improve, Compliance with prescribed medications will improve, and Ability to identify triggers associated with substance abuse/mental health issues will improve  I certify that inpatient services furnished can reasonably be expected to improve the patient's condition.    Armandina Stammer, NP, pmhnp, fnp-bc 11/24/20229:51 AM

## 2021-08-07 NOTE — BHH Group Notes (Signed)
Pt. Verbally participated in group. Pt. Talked about how thankful he was for his kids and grand kids.

## 2021-08-07 NOTE — BHH Group Notes (Signed)
Pt. Participated in afternoon group of stretching, snack and sports discussion. Pt. Reported that he played baseball.

## 2021-08-07 NOTE — BHH Suicide Risk Assessment (Cosign Needed)
Suicide Risk Assessment  Admission Assessment    [facility] Admission Suicide Risk Assessment   Nursing information obtained from:  Patient  Demographic factors:  Male, Low socioeconomic status, Unemployed, Divorced or widowed  Current Mental Status:  Suicidal ideation indicated by patient  Loss Factors:  Decrease in vocational status, Legal issues, Financial problems / change in socioeconomic status  Historical Factors:  Impulsivity  Risk Reduction Factors:  Living with another person, especially a relative, Sense of responsibility to family  Total Time spent with patient: 30 minutes  Principal Problem: MDD (major depressive disorder), recurrent severe, without psychosis (HCC)  Diagnosis:  Principal Problem:   MDD (major depressive disorder), recurrent severe, without psychosis (HCC)  Subjective Data: "I went to the [facility] on the 15th of this month. The police took me there. It was my substance abuse counselor [name]) from [place] called the cops. On that day, I have been drinking a lot liquor, I lost control. I got to feeling overwhelmed & a lot of guilt, start having bad thoughts like I'm going harm myself. I called my substance abuse counselor, [name] & told him how I was feeling. He called the cops for me. While I was at the [facility], they were giving me Ativan for the withdrawal symptoms. I have been drinking alcohol since I was 60 years old. I have been going to the [name] treatment program for my alcoholism in the last 5 months. I go there for 2 hours every Thursday. But I'm still drinking too much. I know now that I'm not going to quit drinking going to an outpatient treatment program. I need a residential treatment program. I need help getting into one.  I have never had any alcohol withdrawal seizures or DTs. I was feeling depressed, but not any more.. I'm feeling much better. I have had thoughts of hurting myself & planned to poison myself with pills, but I have never attempted suicide.  The first time I had bad thoughts because I was feeling very depressed, I was taken to the hospital for 10 days. Theys treated me with Sertraline & Seroquel. I took them for a while & stopped. But, I'm back on Zoloft & Trazodone is helping me sleep well at night. I feel good now, I just want to go to a long term substance abuse program  Continued Clinical Symptoms:  Alcohol Use Disorder Identification Test Final Score (AUDIT): 37 The "Alcohol Use Disorders Identification Test", Guidelines for Use in Primary Care, Second Edition.  World Science writer [facility]). Score between 0-7:  no or low risk or alcohol related problems. Score between 8-15:  moderate risk of alcohol related problems. Score between 16-19:  high risk of alcohol related problems. Score 20 or above:  warrants further diagnostic evaluation for alcohol dependence and treatment.  CLINICAL FACTORS:   Alcohol/Substance Abuse/Dependencies   Musculoskeletal: Strength & Muscle Tone: within normal limits Gait & Station: normal Patient leans: N/A  Psychiatric Specialty Exam:  Presentation  General Appearance: Appropriate for Environment  Eye Contact:Good  Speech:Clear and Coherent; Normal Rate  Speech Volume:Normal  Handedness:Right   Mood and Affect  Mood:Anxious; Dysphoric  Affect:Congruent   Thought Process  Thought Processes:Coherent; Goal Directed  Descriptions of Associations:Intact  Orientation:Full (Time, Place and Person)  Thought Content:WDL  History of Schizophrenia/Schizoaffective disorder:No  Duration of Psychotic Symptoms:N/A  Hallucinations:Hallucinations: None  Ideas of Reference:None  Suicidal Thoughts:Suicidal Thoughts: Yes, Passive SI Passive Intent and/or Plan: Without Intent; Without Plan  Homicidal Thoughts:Homicidal Thoughts: No  Sensorium  Memory:Immediate Good; Recent Good; Remote Good  Judgment:Fair  Insight:Fair; Present  Executive Functions   Concentration:Good  Attention Span:Good  [place] of Knowledge:Good  Language:Good  Psychomotor Activity  Psychomotor Activity:Psychomotor Activity: Normal  Assets  Assets:Communication Skills; Desire for Improvement; Housing; Resilience; Social Support  Sleep  Sleep:Sleep: Good  Physical Exam: Physical Exam Vitals and nursing note reviewed.  HENT:     Head: Normocephalic.     Nose: Nose normal.  Eyes:     Pupils: Pupils are equal, round, and reactive to light.  Cardiovascular:     Rate and Rhythm: Normal rate.     Pulses: Normal pulses.  Pulmonary:     Effort: Pulmonary effort is normal.  Genitourinary:    Comments: Deferred Musculoskeletal:        General: Normal range of motion.     Cervical back: Normal range of motion.  Skin:    General: Skin is warm and dry.  Neurological:     General: No focal deficit present.     Mental Status: He is alert and oriented to person, place, and time.   Review of Systems  Constitutional:  Negative for chills, diaphoresis and fever.  HENT:  Negative for congestion and sore throat.   Eyes:  Negative for blurred vision.  Respiratory:  Negative for cough, shortness of breath and wheezing.   Cardiovascular:  Negative for chest pain and palpitations.  Gastrointestinal:  Negative for abdominal pain, blood in stool, constipation, diarrhea, heartburn, melena, nausea and vomiting.  Genitourinary:  Negative for dysuria.  Musculoskeletal:  Negative for joint pain and myalgias.  Skin: Negative.   Neurological:  Negative for dizziness, tingling, tremors, sensory change, speech change, focal weakness, seizures, loss of consciousness, weakness and headaches.  Endo/Heme/Allergies:        Allergies: NKDA  Psychiatric/Behavioral:  Positive for substance abuse (Hx alcoholism, chronic.). Negative for depression (Stable), hallucinations, memory loss and suicidal ideas (Hx of). The patient is not nervous/anxious and does not have  insomnia.   Blood pressure 122/68, pulse 77, temperature 98.3 F (36.8 C), temperature source Oral, resp. rate 18, height 5\' 6"  (1.676 m), weight 65.3 kg, SpO2 97 %. Body mass index is 23.24 kg/m.  COGNITIVE FEATURES THAT CONTRIBUTE TO RISK:  None    SUICIDE RISK:   Minimal: No identifiable suicidal ideation.  Patients presenting with no risk factors but with morbid ruminations; may be classified as minimal risk based on the severity of the depressive symptoms  PLAN OF CARE: Treatment Plan/Recommendations: 1. Admit for crisis management and stabilization, estimated length of stay 3-5 days.    Diagnoses.  Alcohol use disorder. Major depressive disorder.  High blood pressure.  Gouty arthritis.    2. Medication management to reduce current symptoms to base line and improve the patient's overall level of functioning: See [facility] for plan of care.   Alcohol withdrawal syndrome.  Continue gabapentin 200 mg po tid.   Major depression.  Continue Sertraline  50 mg po daily.   Anxiety.  Continue Hydroxyzine 25 mg po tid prn.   Insomnia.  Continue Trazodone 50 mg po daily prn.   Other medical issues, continue, amlodipine 5 mg po daily for HTN. Indocin 25 mg po tid prn for gout.  Mylanta 30 ml po Q 4 hrs prn for indigestion.  MOM 30 ml po daily prn for constipation.   3. Treat health problems as indicated.    4. Develop treatment plan to decrease risk of relapse upon discharge and the need  for readmission.  5. Psycho-social education regarding relapse prevention and self care.  6. Health care follow up as needed for medical problems.  7. Review, reconcile, and reinstate any pertinent home medications for other health issues where appropriate. 8. Call for consults with hospitalist for any additional specialty patient care services as needed.    I certify that inpatient services furnished can reasonably be expected to improve the patient's condition.   Lindell Spar, NP, pmhnp,  fnp-bc. 08/07/2021, 12:09 PM

## 2021-08-07 NOTE — BHH Group Notes (Signed)
Pt participated in Psycho-Ed group this afternoon.  

## 2021-08-07 NOTE — BHH Suicide Risk Assessment (Signed)
[facility] Admission Suicide Risk Assessment   Nursing information obtained from:  Patient Demographic factors:  Male, Low socioeconomic status, Unemployed, Divorced or widowed Current Mental Status:  Suicidal ideation indicated by patient Loss Factors:  Decrease in vocational status, Legal issues, Financial problems / change in socioeconomic status Historical Factors:  Impulsivity Risk Reduction Factors:  Living with another person, especially a relative, Sense of responsibility to family  Total Time spent with patient: 30 minutes Principal Problem: MDD (major depressive disorder), recurrent severe, without psychosis (HCC) Diagnosis:  Principal Problem:   MDD (major depressive disorder), recurrent severe, without psychosis (HCC) Active Problems:   Alcohol use disorder, severe, dependence (HCC)   GAD (generalized anxiety disorder)  Subjective Data:   Patient is a 61 year old male with a reported psychiatric history of major depressive disorder and alcohol use disorder, who was admitted to the psychiatric unit for worsening depression and suicidal thoughts.  Patient was previously admitted to [facility] from 07/29/2021 through 08/04/2021.  [facility], but there is an issue with insurance, and patient had to return to [facility].  Patient was admitted to this psychiatric unit for worsening depression and passive suicidal thoughts. On interview today, the patient reports feeling down depressed sad, hopeless helpless worthless.  He reports having anhedonia.  Reports poor sleep and low appetite.  He reports difficulty with concentration.  He reports having suicidal thoughts, without intent or plan.  Denies having homicidal thoughts.  Denies having symptoms meeting criteria for mania or hypomania at this time or in the past.  Denies having psychotic symptoms. Patient reports having a history of psychiatric hospitalization.  He denies having history of previous suicide attempt.  Continued  Clinical Symptoms:  Alcohol Use Disorder Identification Test Final Score (AUDIT): 37 The "Alcohol Use Disorders Identification Test", Guidelines for Use in Primary Care, Second Edition.  World Science writer [facility]). Score between 0-7:  no or low risk or alcohol related problems. Score between 8-15:  moderate risk of alcohol related problems. Score between 16-19:  high risk of alcohol related problems. Score 20 or above:  warrants further diagnostic evaluation for alcohol dependence and treatment.   CLINICAL FACTORS:   Severe Anxiety and/or Agitation Depression:   Anhedonia Hopelessness Insomnia Alcohol/Substance Abuse/Dependencies More than one psychiatric diagnosis   Musculoskeletal: Strength & Muscle Tone: within normal limits Gait & Station: normal Patient leans: N/A  Psychiatric Specialty Exam:  Presentation  General Appearance: Disheveled  Eye Contact:Good  Speech:Normal Rate  Speech Volume:Normal  Handedness:Right   Mood and Affect  Mood:Anxious; Depressed; Dysphoric; Worthless  Affect:Tearful; Full Range   Thought Process  Thought Processes:Coherent; Linear  Descriptions of Associations:Intact  Orientation:Full (Time, Place and Person)  Thought Content:Logical  History of Schizophrenia/Schizoaffective disorder:No  Duration of Psychotic Symptoms:N/A  Hallucinations:Hallucinations: None  Ideas of Reference:None  Suicidal Thoughts:Suicidal Thoughts: Yes, Passive SI Active Intent and/or Plan: Without Intent; Without Plan SI Passive Intent and/or Plan: Without Intent; Without Plan  Homicidal Thoughts:Homicidal Thoughts: No   Sensorium  Memory:Immediate Good; Recent Good; Remote Good  Judgment:Fair  Insight:Fair   Executive Functions  Concentration:Fair  Attention Span:Fair  Recall:Good  Fund of Knowledge:Good  Language:Good   Psychomotor Activity  Psychomotor Activity:Psychomotor Activity: Normal   Assets   Assets:Communication Skills; Desire for Improvement; Housing; Resilience; Social Support   Sleep  Sleep:Sleep: Poor    Physical Exam: Physical Exam see H&P ROS see H&P  Blood pressure 122/68, pulse 77, temperature 98.3 F (36.8 C), temperature source Oral, resp. rate 18,  height 5\' 6"  (1.676 m), weight 65.3 kg, SpO2 97 %. Body mass index is 23.24 kg/m.   COGNITIVE FEATURES THAT CONTRIBUTE TO RISK:  None    SUICIDE RISK:   Moderate:  Frequent suicidal ideation with limited intensity, and duration, some specificity in terms of plans, no associated intent, good self-control, limited dysphoria/symptomatology, some risk factors present, and identifiable protective factors, including available and accessible social support.  PLAN OF CARE:   ASSESSMENT:  Diagnoses / Active Problems: Major depressive disorder, severe, recurrent, without psychotic features Generalized anxiety disorder Alcohol use disorder   PLAN: Safety and Monitoring:  --  Voluntary admission to inpatient psychiatric unit for safety, stabilization and treatment  -- Daily contact with patient to assess and evaluate symptoms and progress in treatment  -- Patient's case to be discussed in multi-disciplinary team meeting  -- Observation Level : q15 minute checks  -- Vital signs:  q12 hours  -- Precautions: suicide, elopement, and assault  2. Psychiatric Diagnoses and Treatment:    Continue Zoloft 50 mg once daily for depression and anxiety Continue trazodone 50 mg nightly as needed Continue Atarax 25 mg 3 times daily as needed for anxiety Continue gabapentin 200 mg 3 times daily for anxiety and pain Continue Norvasc 5 mg once daily  --  The risks/benefits/side-effects/alternatives to this medication were discussed in detail with the patient and time was given for questions. The patient consents to medication trial.    -- Metabolic profile and EKG monitoring obtained while on an atypical antipsychotic (BMI:  Lipid Panel: HbgA1c: QTc:) 409  -- Encouraged patient to participate in unit milieu and in scheduled group therapies   -- Short Term Goals: Ability to identify changes in lifestyle to reduce recurrence of condition will improve, Ability to verbalize feelings will improve, Ability to disclose and discuss suicidal ideas, Ability to demonstrate self-control will improve, Ability to identify and develop effective coping behaviors will improve, Ability to maintain clinical measurements within normal limits will improve, Compliance with prescribed medications will improve, and Ability to identify triggers associated with substance abuse/mental health issues will improve  -- Long Term Goals: Improvement in symptoms so as ready for discharge    3. Medical Issues Being Addressed:   Tobacco Use Disorder  -- Nicotine patch 21mg /24 hours ordered  -- Smoking cessation encouraged Alcohol use disorder Hypertension-continue Norvasc 5 mg once daily  4. Discharge Planning:   -- Social work and case management to assist with discharge planning and identification of hospital follow-up needs prior to discharge  -- Estimated LOS: 5-7 days  -- Discharge Concerns: Need to establish a safety plan; Medication compliance and effectiveness  -- Discharge Goals: Return home with outpatient referrals for mental health follow-up including medication management/psychotherapy   I certify that inpatient services furnished can reasonably be expected to improve the patient's condition.   Christoper Allegra, MD 08/07/2021, 4:38 PM

## 2021-08-07 NOTE — Progress Notes (Signed)
   08/07/21 2146  Psych Admission Type (Psych Patients Only)  Admission Status Voluntary  Psychosocial Assessment  Patient Complaints Anxiety  Eye Contact Fair  Facial Expression Anxious  Affect Appropriate to circumstance;Anxious  Speech Logical/coherent  Interaction Minimal  Motor Activity Other (Comment) (WNL)  Appearance/Hygiene Unremarkable  Behavior Characteristics Cooperative  Mood Anxious  Thought Process  Coherency WDL  Content Blaming others  Delusions None reported or observed  Perception WDL  Hallucination None reported or observed  Judgment Impaired  Confusion None  Danger to Self  Current suicidal ideation? Denies  Self-Injurious Behavior No self-injurious ideation or behavior indicators observed or expressed   Agreement Not to Harm Self Yes  Description of Agreement contracts for safety verbal  Danger to Others  Danger to Others None reported or observed  Danger to Others Abnormal  Harmful Behavior to others No threats or harm toward other people  Destructive Behavior No threats or harm toward property  D: Patient stated he had some bad news this afternoon but he was able to talk to the doctor which helped him feel better. Pt denies pain.  A: Medications administered as prescribed. Support and encouragement provided as needed.  R: Patient remains safe on the unit. Will continue to monitor for safety and stability.

## 2021-08-07 NOTE — Plan of Care (Signed)
Nurse discussed anxiety, depression and coping skills with patient.  
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
## 2021-08-07 NOTE — Progress Notes (Addendum)
D:  Patient's self inventory sheet, patient has fair sleep, sleep medication helpful.  Good appetite, normal appetite, good concentration.  Rated depression, anxiety and depression #1.  Denied SI.  Denied physical problem.  Denied phjysical pain.  Goal is oopen minded for any help.  Plans to focus and get back on track.  Does have discharge plans. A:  Medications administrateed per ME oarders.  Emotional support and encouragement given patient.   R:  Denied SI and HI, contracts for safety.  Denied A/V hallucinations.  Safety maintained with 15 minute checks.

## 2021-08-08 ENCOUNTER — Encounter (HOSPITAL_COMMUNITY): Payer: Self-pay

## 2021-08-08 DIAGNOSIS — F332 Major depressive disorder, recurrent severe without psychotic features: Secondary | ICD-10-CM | POA: Diagnosis not present

## 2021-08-08 NOTE — BHH Group Notes (Signed)
Pt attended Psycho-Ed group this afternoon.  

## 2021-08-08 NOTE — BHH Group Notes (Signed)
Pt attended Relaxation Group this afternoon and had a great time listening to music and doing Karaoke. 

## 2021-08-08 NOTE — Progress Notes (Addendum)
Pt denies SI/HI/AVH and verbally agrees to approach staff if these become apparent or before harming themselves/others. Rates depression 0/10. Rates anxiety 7/10. Rates pain 0/10. Pt goal is to "focus and keep on tracking to getting my life together." Pt has stated that his anxiety has gone down significantly since morning medication. Pt has been seen in good conversation with other pt. Pt has been in a much better mood. Pt talkative with RN. Scheduled medications administered to Pt, per MD orders. RN provided support and encouragement to Pt. Q15 min safety checks implemented and continued. Pt safe on the unit. RN will continue to monitor and intervene as needed.   08/08/21 0807  Psych Admission Type (Psych Patients Only)  Admission Status Voluntary  Psychosocial Assessment  Patient Complaints Anxiety  Eye Contact Fair  Facial Expression Other (Comment) (WDL)  Affect Appropriate to circumstance  Speech Logical/coherent  Interaction Assertive  Motor Activity Other (Comment) (WDL)  Appearance/Hygiene Unremarkable  Behavior Characteristics Cooperative;Calm;Anxious  Mood Anxious;Pleasant  Aggressive Behavior  Effect No apparent injury  Thought Process  Coherency WDL  Content Blaming others  Delusions None reported or observed  Perception WDL  Hallucination None reported or observed  Judgment Impaired  Confusion None  Danger to Self  Current suicidal ideation? Denies  Self-Injurious Behavior No self-injurious ideation or behavior indicators observed or expressed   Agreement Not to Harm Self Yes  Description of Agreement contracts for safety verbal  Danger to Others  Danger to Others None reported or observed  Danger to Others Abnormal  Harmful Behavior to others No threats or harm toward other people  Destructive Behavior No threats or harm toward property

## 2021-08-08 NOTE — Progress Notes (Signed)
[facility] MD Progress Note  08/08/2021 1:22 PM Willie Collins  MRN:  [id number]  Subjective: Willie Collins reports, "My mood is improving, but I feel like it is not going well this morning because I'm worried. I need the staff from here to call my state probation officer to tell him that I'm admitted here. I do not want to go to jail after discharge. I have a community service hours that needed to have been completed by November 16th, 2022, but didn't. I was suppose to start this community service this past August, but could not because I was sick".  Daily notes: Willie Collins is seen in the office on the 300-hall, chart reviewed. The chart findings discussed with the treatment team. He presents alert, oriented & aware of situation. He is visible on the unit, attending/participating in the group sessions. He reports improved mood this am, but complaining of feeling worried about getting in trouble or possibly going back to prison because he has an outstanding community service hours that should have been completed on November 16th of this 2022. He states that he was required to start this community service last August but could not because of ill health. He is asking for one of the [facility] staff to call his state probation officer to alert him that he is currently hospitalized in this hospital. He says he approached the social worker & asked for this social worker to call his Catering manager, but the social told him that it was his (patient's responsibility to call his probation officer to inform him of his whereabout. Patient states that he is currently worried of getting in trouble & will go to jail after discharge if his state probation officer is not notified. Patient is reassured that the attending psychiatrist has already called his probation officer to alert him. Patient states he is now relieved & will focus on his treatment here. He continues to exercise his interest in going to [facility]. He currently denies any  SIHI, AVH, delusional thoughts or paranoia. He does not appear to be responding to any internal stimuli. He is taking, tolerating his treatment regimen, denies any side effects. Denies any alcohol withdrawal symptoms. There are no changes made on his current plan of care, will continue as already in progress.   Reason for admission:  60 year old Hispanic male with hx of alcohol use disorder & major depressive disorder. He is admitted to the [facility] from the [facility] with complain of passive suicidal ideations due to being rejected at the [facility] residential treatment center due to insurance issues. Per chart review, patient was admitted to the [facility] with complain of alcohol intoxication, inability to stop drinking heavily triggering suicidal ideations. And while at the [facility], he received alcohol detoxification treatments using the CIWA protocols. After detox, he was sent to the [facility] residential to continue further substance abuse treatment. However, when he got to the [facility] residential treatment center, he was denied admission due to his medicaid is from the [facility]. Patient later returned back to the [facility] citing passive suicidal thoughts. He was brought to the [facility] for further evaluation & treatment.   Principal Problem: MDD (major depressive disorder), recurrent severe, without psychosis ([place])  Diagnosis: Principal Problem:   MDD (major depressive disorder), recurrent severe, without psychosis ([place]) Active Problems:   Alcohol use disorder, severe, dependence ([place])   GAD (generalized anxiety disorder)  Total Time spent with patient:  35 minutes  Past Psychiatric History: Alcohol use disorder, Mdd, GAD.  Past Medical  History:  Past Medical History:  Diagnosis Date   Hypertension     Past Surgical History:  Procedure Laterality Date   IRRIGATION AND DEBRIDEMENT FOOT Left 11/25/2020   Procedure: IRRIGATION AND DEBRIDEMENT FOOT;  Surgeon: Candelaria Stagers, DPM;  Location: [facility];  Service:  Podiatry;  Laterality: Left;   Family History: History reviewed. No pertinent family history.  Family Psychiatric  History: See H&P.  Social History:  Social History   Substance and Sexual Activity  Alcohol Use Yes   Comment: 1/5 liquor per day     Social History   Substance and Sexual Activity  Drug Use Never    Social History   Socioeconomic History   Marital status: Divorced    Spouse name: Not on file   Number of children: 4   Years of education: 12   Highest education level: GED or equivalent  Occupational History   Not on file  Tobacco Use   Smoking status: Never   Smokeless tobacco: Never  Vaping Use   Vaping Use: Never used  Substance and Sexual Activity   Alcohol use: Yes    Comment: 1/5 liquor per day   Drug use: Never   Sexual activity: Yes    Partners: Female  Other Topics Concern   Not on file  Social History Narrative   Not on file   Social Determinants of Health   Financial Resource Strain: Not on file  Food Insecurity: Not on file  Transportation Needs: Not on file  Physical Activity: Not on file  Stress: Not on file  Social Connections: Not on file   Additional Social History:   Sleep: Good  Appetite:  Good  Current Medications: Current Facility-Administered Medications  Medication Dose Route Frequency Provider Last Rate Last Admin   alum & mag hydroxide-simeth (MAALOX/MYLANTA) 200-200-20 MG/5ML suspension 30 mL  30 mL Oral Q4H PRN Rankin, Shuvon B, NP       amLODipine (NORVASC) tablet 5 mg  5 mg Oral Daily Rankin, Shuvon B, NP   5 mg at 08/08/21 0807   gabapentin (NEURONTIN) capsule 200 mg  200 mg Oral TID Rankin, Shuvon B, NP   200 mg at 08/08/21 1251   hydrOXYzine (ATARAX/VISTARIL) tablet 25 mg  25 mg Oral TID PRN Rankin, Shuvon B, NP   25 mg at 08/08/21 0809   indomethacin (INDOCIN) capsule 25 mg  25 mg Oral TID PRN Rankin, Shuvon B, NP   25 mg at 08/06/21 2131   magnesium hydroxide (MILK OF MAGNESIA) suspension 30 mL  30 mL Oral  Daily PRN Rankin, Shuvon B, NP       sertraline (ZOLOFT) tablet 50 mg  50 mg Oral Daily Rankin, Shuvon B, NP   50 mg at 08/08/21 0807   traZODone (DESYREL) tablet 50 mg  50 mg Oral QHS PRN Jaclyn Shaggy, PA-C   50 mg at 08/07/21 2106   Lab Results: No results found for this or any previous visit (from the past 48 hour(s)).  Blood Alcohol level:  Lab Results  Component Value Date   ETH <10 08/04/2021   ETH 277 (H) 07/29/2021   Metabolic Disorder Labs: Lab Results  Component Value Date   HGBA1C 5.4 07/29/2021   MPG 108.28 07/29/2021   MPG 108.28 11/26/2020   No results found for: PROLACTIN Lab Results  Component Value Date   CHOL 259 (H) 07/29/2021   TRIG 62 07/29/2021   HDL >135 07/29/2021   CHOLHDL NOT CALCULATED 07/29/2021  VLDL 12 07/29/2021   LDLCALC NOT CALCULATED 07/29/2021   Physical Findings: AIMS:  , ,  ,  ,    CIWA:    COWS:     Musculoskeletal: Strength & Muscle Tone: within normal limits Gait & Station: normal Patient leans: N/A  Psychiatric Specialty Exam:  Presentation  General Appearance: Appropriate for Environment; Casual; Fairly Groomed  Eye Contact:Good  Speech:Normal Rate  Speech Volume:Normal  Handedness:Right  Mood and Affect  Mood:Anxious (but mood is "improving")  Affect:-- (Worried)  Thought Process  Thought Processes:Coherent; Goal Directed; Linear  Descriptions of Associations:Intact  Orientation:Full (Time, Place and Person)  Thought Content:Logical  History of Schizophrenia/Schizoaffective disorder:No  Duration of Psychotic Symptoms:N/A  Hallucinations:Hallucinations: None  Ideas of Reference:None  Suicidal Thoughts:Suicidal Thoughts: No SI Active Intent and/or Plan: Without Intent; Without Plan; Without Means to Carry Out; Without Access to Means SI Passive Intent and/or Plan: Without Intent; Without Plan; Without Means to Carry Out; Without Access to Means  Homicidal Thoughts:Homicidal Thoughts: No HI  Passive Intent and/or Plan: Without Intent; Without Plan; Without Means to Carry Out; Without Access to Means  Sensorium  Memory:Recent Good; Immediate Good; Remote Good  Judgment:Good  Insight:Fair  Executive Functions  Concentration:Fair  Attention Span:Fair  Recall:Good  Fund of Knowledge:Fair  Language:Good  Psychomotor Activity  Psychomotor Activity:Psychomotor Activity: Other (comment) (Worried about completeing community service.)  Assets  Assets:Communication Skills; Desire for Improvement; Housing; Physical Health; Resilience; Social Support  Sleep  Sleep:Sleep: Good Number of Hours of Sleep: 6.75  Physical Exam: Physical Exam Vitals and nursing note reviewed.  HENT:     Head: Normocephalic.     Nose: Nose normal.     Mouth/Throat:     Pharynx: Oropharynx is clear.  Eyes:     Pupils: Pupils are equal, round, and reactive to light.  Cardiovascular:     Rate and Rhythm: Normal rate.     Pulses: Normal pulses.  Pulmonary:     Effort: Pulmonary effort is normal.  Genitourinary:    Comments: Deferred Musculoskeletal:        General: Normal range of motion.     Cervical back: Normal range of motion.  Skin:    General: Skin is warm and dry.  Neurological:     General: No focal deficit present.     Mental Status: He is alert and oriented to person, place, and time.   Review of Systems  Constitutional:  Negative for chills and fever.  HENT:  Negative for congestion and sore throat.   Eyes:  Negative for blurred vision.  Respiratory:  Negative for cough, shortness of breath and wheezing.   Cardiovascular:  Negative for chest pain and palpitations.  Gastrointestinal:  Negative for abdominal pain, constipation, diarrhea, heartburn, nausea and vomiting.  Genitourinary:  Negative for dysuria.  Musculoskeletal:  Negative for joint pain and myalgias.  Skin: Negative.   Neurological:  Negative for dizziness, tingling, tremors, sensory change, speech change,  focal weakness, seizures, loss of consciousness, weakness and headaches.  Psychiatric/Behavioral:  Positive for depression ("Improving") and substance abuse (Hx. alcohol use disorder, chronic). Negative for hallucinations, memory loss and suicidal ideas. The patient is nervous/anxious. The patient does not have insomnia.   Blood pressure 121/77, pulse 91, temperature 98 F (36.7 C), resp. rate 18, height 5\' 6"  (1.676 m), weight 65.3 kg, SpO2 98 %. Body mass index is 23.24 kg/m.  Treatment Plan Summary: Daily contact with patient to assess and evaluate symptoms and progress in treatment and Medication management.  Continue inpatient hospitalization.  Will continue today 08/08/2021 plan as below except where it is noted.   Diagnoses.  Alcohol use disorder. Major depressive disorder.  High blood pressure.  Gouty arthritis.    2. Medication management to reduce current symptoms to base line and improve the patient's overall level of functioning: See [facility] for plan of care.   Alcohol withdrawal syndrome.  Continue gabapentin 200 mg po tid.   Major depression.  Continue Sertraline  50 mg po daily.   Anxiety.  Continue Hydroxyzine 25 mg po tid prn.   Insomnia.  Continue Trazodone 50 mg po daily prn.   Other medical issues, continue, amlodipine 5 mg po daily for HTN. Indocin 25 mg po tid prn for gout.  Mylanta 30 ml po Q 4 hrs prn for indigestion.  MOM 30 ml po daily prn for constipation.  Encourage group attendance/participation. Discharge disposition plan is ongoing.   Lindell Spar, NP, pmhnp, fnp-bc 08/08/2021, 1:22 PM

## 2021-08-08 NOTE — BHH Group Notes (Signed)
Pt attended morning goals group as well as morning psycho-ed group. Pt had great participation today.  

## 2021-08-08 NOTE — BH IP Treatment Plan (Signed)
Interdisciplinary Treatment and Diagnostic Plan Update  08/08/2021 Time of Session:  Carles Florea MRN: [id number]  Principal Diagnosis: MDD (major depressive disorder), recurrent severe, without psychosis ([place])  Secondary Diagnoses: Principal Problem:   MDD (major depressive disorder), recurrent severe, without psychosis ([place]) Active Problems:   Alcohol use disorder, severe, dependence ([place])   GAD (generalized anxiety disorder)   Current Medications:  Current Facility-Administered Medications  Medication Dose Route Frequency Provider Last Rate Last Admin   alum & mag hydroxide-simeth (MAALOX/MYLANTA) 200-200-20 MG/5ML suspension 30 mL  30 mL Oral Q4H PRN Rankin, Shuvon B, NP       amLODipine (NORVASC) tablet 5 mg  5 mg Oral Daily Rankin, Shuvon B, NP   5 mg at 08/08/21 0807   gabapentin (NEURONTIN) capsule 200 mg  200 mg Oral TID Rankin, Shuvon B, NP   200 mg at 08/08/21 1251   hydrOXYzine (ATARAX/VISTARIL) tablet 25 mg  25 mg Oral TID PRN Rankin, Shuvon B, NP   25 mg at 08/08/21 0809   indomethacin (INDOCIN) capsule 25 mg  25 mg Oral TID PRN Rankin, Shuvon B, NP   25 mg at 08/06/21 2131   magnesium hydroxide (MILK OF MAGNESIA) suspension 30 mL  30 mL Oral Daily PRN Rankin, Shuvon B, NP       sertraline (ZOLOFT) tablet 50 mg  50 mg Oral Daily Rankin, Shuvon B, NP   50 mg at 08/08/21 0807   traZODone (DESYREL) tablet 50 mg  50 mg Oral QHS PRN Margorie John W, PA-C   50 mg at 08/07/21 2106   PTA Medications: Medications Prior to Admission  Medication Sig Dispense Refill Last Dose   amLODipine (NORVASC) 5 MG tablet Take 1 tablet (5 mg total) by mouth daily. 14 tablet 1    gabapentin (NEURONTIN) 100 MG capsule Take 2 capsules (200 mg total) by mouth 3 (three) times daily. 180 capsule 1    hydrOXYzine (ATARAX/VISTARIL) 25 MG tablet Take 1 tablet (25 mg total) by mouth 3 (three) times daily as needed for anxiety. 30 tablet 0    indomethacin (INDOCIN) 25 MG capsule Take 1 capsule (25  mg total) by mouth 3 (three) times daily as needed for moderate pain or mild pain (gout pain). 90 capsule 1    sertraline (ZOLOFT) 50 MG tablet Take 1 tablet (50 mg total) by mouth daily. 30 tablet 1    traZODone (DESYREL) 50 MG tablet Take 1 tablet (50 mg total) by mouth at bedtime as needed for sleep. 14 tablet 0     Patient Stressors: Soil scientist issue   Occupational concerns   Substance abuse    Patient Strengths: Ability for insight  Average or above average intelligence  Capable of independent living  Communication skills  General fund of knowledge  Motivation for treatment/growth  Physical Health  Supportive family/friends  Work skills   Treatment Modalities: Medication Management, Group therapy, Case management,  1 to 1 session with clinician, Psychoeducation, Recreational therapy.   Physician Treatment Plan for Primary Diagnosis: MDD (major depressive disorder), recurrent severe, without psychosis ([place]) Long Term Goal(s): Improvement in symptoms so as ready for discharge   Short Term Goals: Ability to identify changes in lifestyle to reduce recurrence of condition will improve Ability to verbalize feelings will improve Ability to disclose and discuss suicidal ideas Ability to demonstrate self-control will improve Ability to identify and develop effective coping behaviors will improve Ability to maintain clinical measurements within normal limits will improve Compliance with prescribed  medications will improve Ability to identify triggers associated with substance abuse/mental health issues will improve  Medication Management: Evaluate patient's response, side effects, and tolerance of medication regimen.  Therapeutic Interventions: 1 to 1 sessions, Unit Group sessions and Medication administration.  Evaluation of Outcomes: Not Met  Physician Treatment Plan for Secondary Diagnosis: Principal Problem:   MDD (major depressive disorder), recurrent  severe, without psychosis ([place]) Active Problems:   Alcohol use disorder, severe, dependence ([place])   GAD (generalized anxiety disorder)  Long Term Goal(s): Improvement in symptoms so as ready for discharge   Short Term Goals: Ability to identify changes in lifestyle to reduce recurrence of condition will improve Ability to verbalize feelings will improve Ability to disclose and discuss suicidal ideas Ability to demonstrate self-control will improve Ability to identify and develop effective coping behaviors will improve Ability to maintain clinical measurements within normal limits will improve Compliance with prescribed medications will improve Ability to identify triggers associated with substance abuse/mental health issues will improve     Medication Management: Evaluate patient's response, side effects, and tolerance of medication regimen.  Therapeutic Interventions: 1 to 1 sessions, Unit Group sessions and Medication administration.  Evaluation of Outcomes: Not Met   RN Treatment Plan for Primary Diagnosis: MDD (major depressive disorder), recurrent severe, without psychosis ([place]) Long Term Goal(s): Knowledge of disease and therapeutic regimen to maintain health will improve  Short Term Goals: Ability to demonstrate self-control, Ability to participate in decision making will improve, and Ability to disclose and discuss suicidal ideas  Medication Management: RN will administer medications as ordered by provider, will assess and evaluate patient's response and provide education to patient for prescribed medication. RN will report any adverse and/or side effects to prescribing provider.  Therapeutic Interventions: 1 on 1 counseling sessions, Psychoeducation, Medication administration, Evaluate responses to treatment, Monitor vital signs and CBGs as ordered, Perform/monitor CIWA, COWS, AIMS and Fall Risk screenings as ordered, Perform wound care treatments as ordered.  Evaluation of  Outcomes: Not Met   LCSW Treatment Plan for Primary Diagnosis: MDD (major depressive disorder), recurrent severe, without psychosis ([place]) Long Term Goal(s): Safe transition to appropriate next level of care at discharge, Engage patient in therapeutic group addressing interpersonal concerns.  Short Term Goals: Engage patient in aftercare planning with referrals and resources, Increase emotional regulation, and Facilitate acceptance of mental health diagnosis and concerns  Therapeutic Interventions: Assess for all discharge needs, 1 to 1 time with Social worker, Explore available resources and support systems, Assess for adequacy in community support network, Educate family and significant other(s) on suicide prevention, Complete Psychosocial Assessment, Interpersonal group therapy.  Evaluation of Outcomes: Not Met   Progress in Treatment: Attending groups: Yes. Participating in groups: Yes. Taking medication as prescribed: Yes. Toleration medication: Yes. Family/Significant other contact made: No, will contact:  daughter Patient understands diagnosis: Yes. Discussing patient identified problems/goals with staff: Yes. Medical problems stabilized or resolved: Yes. Denies suicidal/homicidal ideation: Yes. Issues/concerns per patient self-inventory: No. Other: None  New problem(s) identified: No, Describe:  None  New Short Term/Long Term Goal(s):medication stabilization, elimination of SI thoughts, development of comprehensive mental wellness plan.  Patient Goals:  "To follow all the way up with my treatment."  Discharge Plan or Barriers:Patient recently admitted. CSW will continue to follow and assess for appropriate referrals and possible discharge planning.    Reason for Continuation of Hospitalization: Depression Medication stabilization Suicidal ideation  Estimated Length of Stay: 3-5 days   Scribe for Treatment Team: Eliott Nine 08/08/2021  2:28 PM

## 2021-08-08 NOTE — Group Note (Signed)
LCSW Group Therapy Note  Group Date: 08/08/2021 Start Time: 1300 End Time: 1400   Type of Therapy and Topic:  Group Therapy - Healthy vs Unhealthy Coping Skills  Participation Level:  Active   Description of Group The focus of this group was to determine what unhealthy coping techniques typically are used by group members and what healthy coping techniques would be helpful in coping with various problems. Patients were guided in becoming aware of the differences between healthy and unhealthy coping techniques. Patients were asked to identify 2-3 healthy coping skills they would like to learn to use more effectively.  Therapeutic Goals Patients learned that coping is what human beings do all day long to deal with various situations in their lives Patients defined and discussed healthy vs unhealthy coping techniques Patients identified their preferred coping techniques and identified whether these were healthy or unhealthy Patients determined 2-3 healthy coping skills they would like to become more familiar with and use more often. Patients provided support and ideas to each other    Therapeutic Modalities Cognitive Behavioral Therapy Motivational Interviewing  Chrys Racer 08/08/2021  1:38 PM

## 2021-08-08 NOTE — BHH Counselor (Signed)
LCSW Note   08/08/2021    12:07 PM   Type of Contact and Topic:  Museum/gallery curator    CSW met with patient by his request, he states that he has not reached his state probation officer to let him know he was at the hospital. CSW provided him with the phone number to reach his probation officer. Patient attempted to call, states that he "was unable to leave a voicemail, it said to cal a different number." When asked if he attempted to call the other phone number he stated he hadn't and that it was said reall fast. CSW called number provided and wrote alternate contact for state probation and provided it to the patient.   Patient states that he was suppose to complete community service by the Nov 16, however, he was admitted on 15 Nov. Further states that he had 90 days to complete hours. CSW informed patient that we can get him a letter listing dates of service if needed, however, it would only cover the dates which he has been admitted to a [facility] facility.  Patient now reports that all is worked out with probation.     Signed:  Durenda Hurt, MSW, [place], LCAS 08/08/2021 12:07 PM

## 2021-08-08 NOTE — Progress Notes (Signed)
Patient did attend the evening speaker AA meeting.  

## 2021-08-09 ENCOUNTER — Telehealth (HOSPITAL_COMMUNITY): Payer: Self-pay

## 2021-08-09 DIAGNOSIS — F1020 Alcohol dependence, uncomplicated: Secondary | ICD-10-CM

## 2021-08-09 DIAGNOSIS — F332 Major depressive disorder, recurrent severe without psychotic features: Secondary | ICD-10-CM | POA: Diagnosis not present

## 2021-08-09 DIAGNOSIS — F411 Generalized anxiety disorder: Secondary | ICD-10-CM

## 2021-08-09 MED ORDER — SERTRALINE HCL 25 MG PO TABS
75.0000 mg | ORAL_TABLET | Freq: Every day | ORAL | Status: DC
  Administered 2021-08-10 – 2021-08-14 (×5): 75 mg via ORAL
  Filled 2021-08-09 (×7): qty 3
  Filled 2021-08-09: qty 21

## 2021-08-09 NOTE — BH Assessment (Signed)
Care Management - Follow Up [facility] Discharges   Patient has been placed in an inpatient psychiatric hospital [facility]) on 08-06-2021.

## 2021-08-09 NOTE — Progress Notes (Signed)
Pt denies SI/HI/AVH and verbally agrees to approach staff if these become apparent or before harming themselves/others. Rates depression 0/10. Rates anxiety 7/10. Rates pain 0/10. Pt stated that his goal is to do anything necessary to continue treatment." Pt stated that he wanted an ativan because he thinks that the ativan works better for him. RN told him he only had a vistaril and he was okay with it. Pt stated later in the day that the vistaril really helped him calm down. Pt seen interacting with others in the day room and hallway. Pt has been pleasant. Scheduled medications administered to Pt, per MD orders. RN provided support and encouragement to Pt. Q15 min safety checks implemented and continued. Pt safe on the unit. RN will continue to monitor and intervene as needed.   08/09/21 0751  Psych Admission Type (Psych Patients Only)  Admission Status Voluntary  Psychosocial Assessment  Patient Complaints Anxiety  Eye Contact Fair  Facial Expression Anxious;Sad  Affect Appropriate to circumstance  Speech Logical/coherent  Interaction Assertive  Motor Activity Other (Comment) (WDL)  Appearance/Hygiene Unremarkable  Behavior Characteristics Cooperative;Anxious;Appropriate to situation  Mood Anxious;Pleasant;Sad  Aggressive Behavior  Effect No apparent injury  Thought Process  Coherency WDL  Content Blaming others  Delusions None reported or observed  Perception WDL  Hallucination None reported or observed  Judgment Impaired  Confusion None  Danger to Self  Current suicidal ideation? Denies  Self-Injurious Behavior No self-injurious ideation or behavior indicators observed or expressed   Agreement Not to Harm Self Yes  Description of Agreement contracts for safety verbal  Danger to Others  Danger to Others None reported or observed  Danger to Others Abnormal  Harmful Behavior to others No threats or harm toward other people  Destructive Behavior No threats or harm toward property

## 2021-08-09 NOTE — [facility] Group Notes (Signed)
The focus of this group is to educate the patient on the purpose and policies of crisis stabilization and provide a format to answer questions about their admission.  The group details unit policies and expectations of patients while admitted.  Pt attended and participated in morning orientation group.  

## 2021-08-09 NOTE — Group Note (Signed)
LCSW Group Therapy Note  08/09/2021    10:00-11:00am   Type of Therapy and Topic:  Group Therapy: Early Messages Received About Anger  Participation Level:  Active   Description of Group:   In this group, patients shared and discussed the early messages received in their lives about anger through parental or other adult modeling, teaching, repression, punishment, violence, and more.  Participants identified how those childhood lessons influence even now how they usually or often react when angered.  The group discussed that anger is a secondary emotion and what may be the underlying emotional themes that come out through anger outbursts or that are ignored through anger suppression.    Therapeutic Goals: Patients will identify one or more childhood message about anger that they received and how it was taught to them. Patients will discuss how these childhood experiences have influenced and continue to influence their own expression or repression of anger even today. Patients will explore possible primary emotions that tend to fuel their secondary emotion of anger. Patients will learn that anger itself is normal and cannot be eliminated, and that healthier coping skills can assist with resolving conflict rather than worsening situations.  Summary of Patient Progress:  The patient shared that his childhood lessons about anger were that anger and alcohol were related (his father was an alcoholic).  As a result, he became an alcoholic as an adult.  The patient participated fully and demonstrated insight.  Therapeutic Modalities:   Cognitive Behavioral Therapy Motivation Interviewing    [name], [place] 08/09/2021  12:17 PM

## 2021-08-09 NOTE — Progress Notes (Signed)
[name] Group Notes:  (Nursing/MHT/Case Management/Adjunct)  Date:  08/09/2021  Time:  2015  Type of Therapy:   wrap up group  Participation Level:  Active  Participation Quality:  Appropriate, Attentive, Sharing, and Supportive  Affect:  Appropriate  Cognitive:  Alert  Insight:  Improving  Engagement in Group:  Engaged  Modes of Intervention:  Clarification, Education, and Support  Summary of Progress/Problems: Positive thinking and positive change were discussed.   Marcille Buffy 08/09/2021, 8:55 PM

## 2021-08-09 NOTE — Progress Notes (Signed)
   08/09/21 2235  Psych Admission Type (Psych Patients Only)  Admission Status Voluntary  Psychosocial Assessment  Patient Complaints Anxiety  Eye Contact Fair  Facial Expression Animated  Affect Appropriate to circumstance  Speech Logical/coherent  Interaction Assertive  Motor Activity Other (Comment) (WDL)  Appearance/Hygiene Unremarkable  Behavior Characteristics Appropriate to situation  Mood Pleasant  Thought Process  Coherency WDL  Content WDL  Delusions None reported or observed  Perception WDL  Hallucination None reported or observed  Judgment Impaired  Confusion None  Danger to Self  Current suicidal ideation? Denies  Self-Injurious Behavior No self-injurious ideation or behavior indicators observed or expressed   Agreement Not to Harm Self Yes  Description of Agreement contracts for safety verbal  Danger to Others  Danger to Others None reported or observed  Danger to Others Abnormal  Harmful Behavior to others No threats or harm toward other people  Destructive Behavior No threats or harm toward property

## 2021-08-09 NOTE — Progress Notes (Signed)
[facility] MD Progress Note  08/09/2021 2:13 PM Willie Collins  MRN:  [id number]  Reason for admission:  60 year old Hispanic male with hx of alcohol use disorder & major depressive disorder. He is admitted to the [facility] from the [facility] with complain of passive suicidal ideations due to being rejected at the [facility] residential treatment center due to insurance issues. Per chart review, patient was admitted to the [facility] with complain of alcohol intoxication, inability to stop drinking heavily triggering suicidal ideations. And while at the [facility], he received alcohol detoxification treatments using the [facility] protocols. After detox, he was sent to the [facility] residential to continue further substance abuse treatment. However, when he got to the [facility] residential treatment center, he was denied admission due to his medicaid is from the [facility]. Patient later returned back to the [facility] citing passive suicidal thoughts. He was brought to the [facility] for further evaluation & treatment.   Recommendations made by the psychiatry team yesterday:  Continue gabapentin 200 mg po tid. Continue Sertraline  50 mg po daily.  Evaluation on the unit today: Patient was seen,  chart reviewed and case discussed with the treatment team. Algernon stated he was worrying about his probation officer and that was consuming his thoughts. He stated he spoke with the psychiatrist yesterday and they tried to call his probation officer to let him know he is in the hospital but he did not answer, they had to leave a message. He reported that the psychiatrist made a lot of sense when he told him that worrying about something is not going to change anything and it just occupies your mind so you can't focus on other things. He has a very real fear that he will go back to jail if his probation officer does not know where he is. He is asking for a letter to be sent to his probation officer letting him know he has been hospitalized since Nov 15. He  appears anxious, rated his anxiety 7/10 with 10 being the best. He stated he is going to group sessions and finds them helpful, he stated he is enjoying the interaction with others. He denied feeling depressed today, however he is worried and perseverating on going to jail which makes him feel helpless and hopeless. He stated his mood is a 10/10 today and he feels a little better knowing we are trying to help him.  He stated he has a lot on his mind and is afraid he will have to go to jail because he has been unable to do complete his community service hours. He continues to express interest in going to [facility], he is calling daily to inquire about bed availability. He currently denies any SI/HI, AVH, delusional thoughts or paranoia. He does not appear to be responding to internal stimuli. He is taking his medications as prescribed and is tolerating his treatment regimen, denies any medication side effects. He denies any alcohol withdrawal symptoms or cravings today. We will increase his Zoloft from 50 mg to 75 mg starting tomorrow to better target his anxiety and depression. He is able to contract for safety on the unit.   Principal Problem: MDD (major depressive disorder), recurrent severe, without psychosis ([place])  Diagnosis: Principal Problem:   MDD (major depressive disorder), recurrent severe, without psychosis ([place]) Active Problems:   Alcohol use disorder, severe, dependence ([place])   GAD (generalized anxiety disorder)  Total Time spent with patient:  25 minutes  Past Psychiatric History: Alcohol use disorder, Mdd,  GAD.  Past Medical History:  Past Medical History:  Diagnosis Date   Hypertension     Past Surgical History:  Procedure Laterality Date   IRRIGATION AND DEBRIDEMENT FOOT Left 11/25/2020   Procedure: IRRIGATION AND DEBRIDEMENT FOOT;  Surgeon: Candelaria Stagers, DPM;  Location: [facility];  Service: Podiatry;  Laterality: Left;   Family History: History reviewed. No pertinent family  history.  Family Psychiatric  History: See H&P.  Social History:  Social History   Substance and Sexual Activity  Alcohol Use Yes   Comment: 1/5 liquor per day     Social History   Substance and Sexual Activity  Drug Use Never    Social History   Socioeconomic History   Marital status: Divorced    Spouse name: Not on file   Number of children: 4   Years of education: 12   Highest education level: GED or equivalent  Occupational History   Not on file  Tobacco Use   Smoking status: Never   Smokeless tobacco: Never  Vaping Use   Vaping Use: Never used  Substance and Sexual Activity   Alcohol use: Yes    Comment: 1/5 liquor per day   Drug use: Never   Sexual activity: Yes    Partners: Female  Other Topics Concern   Not on file  Social History Narrative   Not on file   Social Determinants of Health   Financial Resource Strain: Not on file  Food Insecurity: Not on file  Transportation Needs: Not on file  Physical Activity: Not on file  Stress: Not on file  Social Connections: Not on file   Additional Social History:   Sleep: Good  Appetite:  Good  Current Medications: Current Facility-Administered Medications  Medication Dose Route Frequency Provider Last Rate Last Admin   alum & mag hydroxide-simeth (MAALOX/MYLANTA) 200-200-20 MG/5ML suspension 30 mL  30 mL Oral Q4H PRN Rankin, Shuvon B, NP       amLODipine (NORVASC) tablet 5 mg  5 mg Oral Daily Rankin, Shuvon B, NP   5 mg at 08/09/21 0751   gabapentin (NEURONTIN) capsule 200 mg  200 mg Oral TID Rankin, Shuvon B, NP   200 mg at 08/09/21 1145   hydrOXYzine (ATARAX/VISTARIL) tablet 25 mg  25 mg Oral TID PRN Rankin, Shuvon B, NP   25 mg at 08/09/21 0751   indomethacin (INDOCIN) capsule 25 mg  25 mg Oral TID PRN Rankin, Shuvon B, NP   25 mg at 08/06/21 2131   magnesium hydroxide (MILK OF MAGNESIA) suspension 30 mL  30 mL Oral Daily PRN Rankin, Shuvon B, NP       sertraline (ZOLOFT) tablet 50 mg  50 mg Oral  Daily Rankin, Shuvon B, NP   50 mg at 08/09/21 0751   traZODone (DESYREL) tablet 50 mg  50 mg Oral QHS PRN Jaclyn Shaggy, PA-C   50 mg at 08/08/21 2117   Lab Results: No results found for this or any previous visit (from the past 48 hour(s)).  Blood Alcohol level:  Lab Results  Component Value Date   ETH <10 08/04/2021   ETH 277 (H) 07/29/2021   Metabolic Disorder Labs: Lab Results  Component Value Date   HGBA1C 5.4 07/29/2021   MPG 108.28 07/29/2021   MPG 108.28 11/26/2020   No results found for: PROLACTIN Lab Results  Component Value Date   CHOL 259 (H) 07/29/2021   TRIG 62 07/29/2021   HDL >135 07/29/2021   CHOLHDL NOT  CALCULATED 07/29/2021   VLDL 12 07/29/2021   LDLCALC NOT CALCULATED 07/29/2021   Physical Findings: AIMS:  , ,  ,  ,    CIWA:    COWS:     Musculoskeletal: Strength & Muscle Tone: within normal limits Gait & Station: normal Patient leans: N/A  Psychiatric Specialty Exam:  Presentation  General Appearance: Appropriate for Environment; Casual; Fairly Groomed  Eye Contact:Good  Speech:Normal Rate  Speech Volume:Normal  Handedness:Right  Mood and Affect  Mood:Anxious; Depressed (but mood is "improving")  Affect:-- (Worried)  Thought Process  Thought Processes:Coherent; Goal Directed; Linear  Descriptions of Associations:Intact  Orientation:Full (Time, Place and Person)  Thought Content:Perseveration; Rumination  History of Schizophrenia/Schizoaffective disorder:No  Duration of Psychotic Symptoms:N/A  Hallucinations:Hallucinations: None  Ideas of Reference:None  Suicidal Thoughts:Suicidal Thoughts: No  Homicidal Thoughts:Homicidal Thoughts: No  Sensorium  Memory:Recent Good; Immediate Good; Remote Good  Judgment:Fair  Insight:Fair  Executive Functions  Concentration:Good  Attention Span:Fair  [place] of Knowledge:Good  Language:Good  Psychomotor Activity  Psychomotor Activity:Psychomotor Activity:  Normal  Assets  Assets:Communication Skills; Desire for Improvement; Housing; Physical Health; Resilience; Social Support; Catering manager; Vocational/Educational  Sleep  Sleep:Sleep: Good Number of Hours of Sleep: 6.75  Physical Exam: Physical Exam Vitals and nursing note reviewed.  HENT:     Head: Normocephalic.     Nose: Nose normal.     Mouth/Throat:     Pharynx: Oropharynx is clear.  Eyes:     Pupils: Pupils are equal, round, and reactive to light.  Cardiovascular:     Rate and Rhythm: Normal rate.     Pulses: Normal pulses.  Pulmonary:     Effort: Pulmonary effort is normal.  Genitourinary:    Comments: Deferred Musculoskeletal:        General: Normal range of motion.     Cervical back: Normal range of motion.  Skin:    General: Skin is warm and dry.  Neurological:     General: No focal deficit present.     Mental Status: He is alert and oriented to person, place, and time.   Review of Systems  Constitutional:  Negative for chills and fever.  HENT:  Negative for congestion and sore throat.   Eyes:  Negative for blurred vision.  Respiratory:  Negative for cough, shortness of breath and wheezing.   Cardiovascular:  Negative for chest pain and palpitations.  Gastrointestinal:  Negative for abdominal pain, constipation, diarrhea, heartburn, nausea and vomiting.  Genitourinary:  Negative for dysuria.  Musculoskeletal:  Negative for joint pain and myalgias.  Skin: Negative.   Neurological:  Negative for dizziness, tingling, tremors, sensory change, speech change, focal weakness, seizures, loss of consciousness, weakness and headaches.  Psychiatric/Behavioral:  Positive for depression ("Improving") and substance abuse (Hx. alcohol use disorder, chronic). Negative for hallucinations, memory loss and suicidal ideas. The patient is nervous/anxious. The patient does not have insomnia.   Blood pressure (!) 124/97, pulse 84, temperature 99.1 F (37.3 C),  temperature source Oral, resp. rate 18, height 5\' 6"  (1.676 m), weight 65.3 kg, SpO2 98 %. Body mass index is 23.24 kg/m.  Treatment Plan Summary: Daily contact with patient to assess and evaluate symptoms and progress in treatment and Medication management.  Continue inpatient hospitalization.  Will continue today 08/09/2021 plan as below except where it is noted.   Diagnoses.  Alcohol use disorder. Major depressive disorder.  High blood pressure.  Gouty arthritis.    2. Medication management to reduce current symptoms to base line and  improve the patient's overall level of functioning: See [facility] for plan of care.   Alcohol withdrawal syndrome.  Continue gabapentin 200 mg po tid.   Major depression.  Increase Sertraline from 50 mg to 75 mg po daily, first increase dose on 08/10/2021.   Anxiety.  Continue Hydroxyzine 25 mg po tid prn.   Insomnia.  Continue Trazodone 50 mg po daily prn.   Other medical issues, continue, amlodipine 5 mg po daily for HTN. Indocin 25 mg po tid prn for gout.  Mylanta 30 ml po Q 4 hrs prn for indigestion.  MOM 30 ml po daily prn for constipation.  Encourage group attendance/participation. Discharge disposition plan is ongoing.   Ethelene Hal, NP 08/09/2021, 2:13 PM

## 2021-08-09 NOTE — [facility] Group Notes (Signed)
.  Psychoeducational Group Note    Date:08/09/2021 Time: 1300-1400    Purpose of Group: . The group focus' on teaching patients on how to identify their needs and their Life Skills:  A group where two lists are made. What people need and what are things that we do that are unhealthy. The lists are developed by the patients and it is explained that we often do the actions that are not healthy to get our list of needs met.  Goal:: to develop the coping skills needed to get their needs met  Participation Level:  Active  Participation Quality:  Appropriate  Affect:  Appropriate  Cognitive:  Oriented  Insight:  Improving  Engagement in Group:  Engaged  Additional Comments:  Pt paying good attention to this writer and to others in the group, and responding and interjecting his ideas. Given some ideas to think about. Impowered in the group.  Willie Collins

## 2021-08-09 NOTE — [facility] Group Notes (Signed)
.  Psychoeducational Group Note  Date: 08/09/2021 Time: 0900-1000    Goal Setting   Purpose of Group: This group helps to provide patients with the steps of setting a goal that is specific, measurable, attainable, realistic and time specific. A discussion on how we keep ourselves stuck with negative self talk. Homework given for Patients to write 30 positive attributes about themselves.    Participation Level:  Active  Participation Quality:  Appropriate  Affect:  Appropriate  Cognitive:  Appropriate  Insight:  Improving  Engagement in Group:  Engaged  Additional Comments:  Pt answered all the questions but otherwise sat quietly. Pt rates his energy at a 7/10.  Dione Housekeeper

## 2021-08-09 NOTE — Progress Notes (Signed)
   08/08/21 2117  Psych Admission Type (Psych Patients Only)  Admission Status Voluntary  Psychosocial Assessment  Patient Complaints Insomnia  Eye Contact Fair  Facial Expression Sad  Affect Appropriate to circumstance  Speech Logical/coherent  Interaction Assertive  Motor Activity Other (Comment) (unremarkable)  Appearance/Hygiene Unremarkable  Behavior Characteristics Calm;Cooperative;Appropriate to situation  Mood Anxious;Sad  Thought Process  Coherency WDL  Content Blaming others  Delusions None reported or observed  Perception WDL  Hallucination None reported or observed  Judgment Impaired  Confusion None  Danger to Self  Current suicidal ideation? Denies  Danger to Others  Danger to Others None reported or observed

## 2021-08-10 DIAGNOSIS — F332 Major depressive disorder, recurrent severe without psychotic features: Secondary | ICD-10-CM | POA: Diagnosis not present

## 2021-08-10 DIAGNOSIS — F411 Generalized anxiety disorder: Secondary | ICD-10-CM | POA: Diagnosis not present

## 2021-08-10 DIAGNOSIS — F1020 Alcohol dependence, uncomplicated: Secondary | ICD-10-CM | POA: Diagnosis not present

## 2021-08-10 MED ORDER — TRAZODONE HCL 50 MG PO TABS
50.0000 mg | ORAL_TABLET | Freq: Every evening | ORAL | Status: DC | PRN
  Administered 2021-08-10: 21:00:00 50 mg via ORAL
  Filled 2021-08-10 (×4): qty 1

## 2021-08-10 MED ORDER — BUSPIRONE HCL 5 MG PO TABS
5.0000 mg | ORAL_TABLET | Freq: Three times a day (TID) | ORAL | Status: DC
  Administered 2021-08-10 – 2021-08-11 (×3): 5 mg via ORAL
  Filled 2021-08-10 (×9): qty 1

## 2021-08-10 MED ORDER — ACETAMINOPHEN 325 MG PO TABS
650.0000 mg | ORAL_TABLET | Freq: Four times a day (QID) | ORAL | Status: DC | PRN
  Administered 2021-08-10 – 2021-08-13 (×3): 650 mg via ORAL
  Filled 2021-08-10 (×2): qty 2

## 2021-08-10 MED ORDER — TRAZODONE HCL 50 MG PO TABS: 50.0000 mg | ORAL_TABLET | Freq: Every evening | ORAL | Status: DC | PRN

## 2021-08-10 MED ORDER — ACETAMINOPHEN 325 MG PO TABS
ORAL_TABLET | ORAL | Status: AC
  Filled 2021-08-10: qty 2

## 2021-08-10 NOTE — Progress Notes (Signed)
[name] Group Notes:  (Nursing/MHT/Case Management/Adjunct)  Date:  08/10/2021  Time:  2015 Type of Therapy:   wrap up group  Participation Level:  Active  Participation Quality:  Appropriate  Affect:  Appropriate  Cognitive:  Alert  Insight:  Improving  Engagement in Group:  Engaged  Modes of Intervention:  Clarification, Socialization, and Support  Summary of Progress/Problems: Positive thinking and self-care were discussed.   Marcille Buffy 08/10/2021, 9:04 PM

## 2021-08-10 NOTE — Group Note (Signed)
[name] LCSW Group Therapy Note  Date/Time:  08/10/2021 10:00-11:00AM  Type of Therapy and Topic:  Group Therapy:  Healthy and Unhealthy Supports plus being "My Own Hero"  Participation Level:  Active   Description of Group:   [employer], LCSWA, led group.  Patients in this group were invited to identify the differences between healthy and unhealthy supports and then to identify those people in their lives who fall into one category or the other, as well as why.  They were then introduced to the idea of adding more healthy supports and decreasing the unhealthy ones.  Patients discussed what additional healthy supports could be helpful in their recovery and wellness after discharge in order to maintain stability.   An emphasis was placed on using counselor, doctor, therapy groups, 12-step groups, and problem-specific support groups to expand supports.  The song "My Own Hero" was played to encourage full participation in their own recovery journey instead of expecting others to do all the work for them.  The song "I Know Where I've Been" was played as further encouragement that they are further in their journey than they were previously.  Therapeutic Goals:   1)  discuss importance of adding supports to stay well once out of the hospital  2)  compare healthy versus unhealthy supports and identify some examples of each  3)  generate ideas and descriptions of healthy supports that can be added  4)  offer mutual support about how to address unhealthy supports  5)  encourage active participation in and adherence to discharge plan    Summary of Patient Progress:  The patient stated that current healthy supports in his life are daughter while current unhealthy supports include himself because he drinks alcohol.  The patient expressed a willingness to add appropriate support(s) to help in his recovery journey.  He hopes to go to [facility] at discharge.   Therapeutic Modalities:   Motivational  Interviewing Brief Solution-Focused Therapy  Ambrose Mantle, LCSW

## 2021-08-10 NOTE — Progress Notes (Signed)
   08/10/21 2244  Psych Admission Type (Psych Patients Only)  Admission Status Voluntary  Psychosocial Assessment  Patient Complaints None  Eye Contact Fair  Facial Expression Animated  Affect Appropriate to circumstance  Speech Logical/coherent  Interaction Assertive  Motor Activity Other (Comment) (WDL)  Appearance/Hygiene Unremarkable  Behavior Characteristics Appropriate to situation  Mood Pleasant  Thought Process  Coherency WDL  Content WDL  Delusions None reported or observed  Perception WDL  Hallucination None reported or observed  Judgment Impaired  Confusion None  Danger to Self  Current suicidal ideation? Denies  Self-Injurious Behavior No self-injurious ideation or behavior indicators observed or expressed   Agreement Not to Harm Self Yes  Description of Agreement contracts for safety verbal  Danger to Others  Danger to Others None reported or observed  Danger to Others Abnormal  Harmful Behavior to others No threats or harm toward other people  Destructive Behavior No threats or harm toward property

## 2021-08-10 NOTE — Progress Notes (Signed)
[facility]  08/10/2021 9:20 AM Willie Collins  MRN:  [id number]  Reason for admission:  60 year old Hispanic male with hx of alcohol use disorder & major depressive disorder. [facility] with complain of passive suicidal ideations due to being rejected at the [facility] due to insurance issues. Per chart review, patient [facility] with complain of alcohol intoxication, inability to stop drinking heavily triggering suicidal ideations. And [facility], he received alcohol detoxification treatments using the [facility] protocols. After detox, he was sent [facility] to continue further substance abuse treatment. However, when [facility], he was denied admission due [place]. [facility] citing passive suicidal thoughts. [facility] further [facility] & treatment.   Recommendations made by the psychiatry team yesterday:  Continue gabapentin 200 mg po tid. Increase Sertraline to 75 mg PO daily  Evaluation on the unit today: Patient was seen,  chart reviewed and case discussed with the treatment team. Willie Collins stated [facility] not accepted there. He has been calling daily since his intake and acceptance on Wednesday, waiting for bed availability. He was anxious and upset at hearing this. Social work called and [facility] and patient has been accepted there and should continue to call daily for a bed. He stated when the social worker told him this he was relieved. He appears less anxious today, he is smiling and able to understand the miscommunication at [facility]. He rated his anxiety 7/10 with 10 being the worst. He stated he is going to group sessions and finds them helpful, he stated he is enjoying the interaction with others. He denied  feeling depressed today, however he is worried and perseverating on going to jail which makes him feel helpless and hopeless. He is very worried that he is going to be sent to jail. He stated he is not sleeping very well and is dreaming about fighting with people. We discussed the possibility that his dreams are stemming form his overwhelming worrying. Suggested he try to do some progressive relaxation at bedtime. He stated he to do word search puzzles but is having difficulty seeing to read because he does not have his readers. He currently denies any SI/HI, AVH, delusional thoughts or paranoia. He does not appear to be responding to internal stimuli. He is taking his medications as prescribed and is tolerating his treatment regimen, denies any medication side effects. He denies any alcohol withdrawal symptoms or cravings today. He is tolerating the increase in increase his Zoloft to 75 mg today and we will add Buspar 5 mg TID to help his anxiety.  He is able to contract for safety on the unit.   Principal Problem: MDD (major depressive disorder), recurrent severe, without psychosis (HCC)  Diagnosis: Principal Problem:   MDD (major depressive disorder), recurrent severe, without psychosis (HCC) Active Problems:   Alcohol use disorder, severe, dependence (HCC)   GAD (generalized anxiety disorder)  Total Time spent with patient:  25 minutes  Past Psychiatric History: Alcohol use disorder, Mdd, GAD.  Past Medical History:  Past Medical History:  Diagnosis Date   Hypertension     Past Surgical History:  Procedure Laterality Date   IRRIGATION AND DEBRIDEMENT FOOT Left 11/25/2020  Procedure: IRRIGATION AND DEBRIDEMENT FOOT;  Surgeon: Candelaria Stagers, DPM;  Location: [facility];  Service: Podiatry;  Laterality: Left;   Family History: History reviewed. No pertinent family history.  Family Psychiatric  History: See H&P.  Social History:  Social History   Substance and Sexual Activity  Alcohol Use  Yes   Comment: 1/5 liquor per day     Social History   Substance and Sexual Activity  Drug Use Never    Social History   Socioeconomic History   Marital status: Divorced    Spouse name: Not on file   Number of children: 4   Years of education: 12   Highest education level: GED or equivalent  Occupational History   Not on file  Tobacco Use   Smoking status: Never   Smokeless tobacco: Never  Vaping Use   Vaping Use: Never used  Substance and Sexual Activity   Alcohol use: Yes    Comment: 1/5 liquor per day   Drug use: Never   Sexual activity: Yes    Partners: Female  Other Topics Concern   Not on file  Social History Narrative   Not on file   Social Determinants of Health   Financial Resource Strain: Not on file  Food Insecurity: Not on file  Transportation Needs: Not on file  Physical Activity: Not on file  Stress: Not on file  Social Connections: Not on file   Additional Social History:   Sleep: Good  Appetite:  Good  Current Medications: Current Facility-Administered Medications  Medication Dose Route Frequency Provider Last Rate Last Admin   alum & mag hydroxide-simeth (MAALOX/MYLANTA) 200-200-20 MG/5ML suspension 30 mL  30 mL Oral Q4H PRN Rankin, Shuvon B, NP       amLODipine (NORVASC) tablet 5 mg  5 mg Oral Daily Rankin, Shuvon B, NP   5 mg at 08/10/21 0748   gabapentin (NEURONTIN) capsule 200 mg  200 mg Oral TID Rankin, Shuvon B, NP   200 mg at 08/10/21 0748   hydrOXYzine (ATARAX/VISTARIL) tablet 25 mg  25 mg Oral TID PRN Rankin, Shuvon B, NP   25 mg at 08/10/21 0749   indomethacin (INDOCIN) capsule 25 mg  25 mg Oral TID PRN Rankin, Shuvon B, NP   25 mg at 08/06/21 2131   magnesium hydroxide (MILK OF MAGNESIA) suspension 30 mL  30 mL Oral Daily PRN Rankin, Shuvon B, NP       sertraline (ZOLOFT) tablet 75 mg  75 mg Oral Daily Laveda Abbe, NP   75 mg at 08/10/21 0747   traZODone (DESYREL) tablet 50 mg  50 mg Oral QHS PRN Jaclyn Shaggy, PA-C    50 mg at 08/09/21 2124   Lab Results: No results found for this or any previous visit (from the past 48 hour(s)).  Blood Alcohol level:  Lab Results  Component Value Date   ETH <10 08/04/2021   ETH 277 (H) 07/29/2021   Metabolic Disorder Labs: Lab Results  Component Value Date   HGBA1C 5.4 07/29/2021   MPG 108.28 07/29/2021   MPG 108.28 11/26/2020   No results found for: PROLACTIN Lab Results  Component Value Date   CHOL 259 (H) 07/29/2021   TRIG 62 07/29/2021   HDL >135 07/29/2021   CHOLHDL NOT CALCULATED 07/29/2021   VLDL 12 07/29/2021   LDLCALC NOT CALCULATED 07/29/2021   Physical Findings: AIMS:  , ,  ,  ,    CIWA:    COWS:  Musculoskeletal: Strength & Muscle Tone: within normal limits Gait & Station: normal Patient leans: N/A  Psychiatric Specialty Exam:  Presentation  General Appearance: Appropriate for Environment; Casual; Fairly Groomed  Eye Contact:Good  Speech:Normal Rate  Speech Volume:Normal  Handedness:Right  Mood and Affect  Mood:Anxious; Depressed (but mood is "improving")  Affect:-- (Worried)  Thought Process  Thought Processes:Coherent; Goal Directed; Linear  Descriptions of Associations:Intact  Orientation:Full (Time, Place and Person)  Thought Content:Perseveration; Rumination  History of Schizophrenia/Schizoaffective disorder:No  Duration of Psychotic Symptoms:N/A  Hallucinations:no patient denies  Ideas of Reference:None  Suicidal Thoughts:No patient denies  Homicidal Thoughts:No, patient denies  Sensorium  Memory:Recent Good; Immediate Good; Remote Good  Judgment:Fair  Insight:Fair  Executive Functions  Concentration:Good  Attention Span:Fair  [place]  Language:Good  Psychomotor Activity  Psychomotor Activity:Normal  Assets  Assets:Communication Skills; Desire for Improvement; Housing; Physical Health; Resilience; Social Support; Catering manager;  Vocational/Educational  Sleep  Sleep: Fair, 5.5 hours, patient complains of dreaming all night  Physical Exam: Physical Exam Vitals and nursing note reviewed.  HENT:     Head: Normocephalic.     Nose: Nose normal.     Mouth/Throat:     Pharynx: Oropharynx is clear.  Eyes:     Pupils: Pupils are equal, round, and reactive to light.  Cardiovascular:     Rate and Rhythm: Normal rate.     Pulses: Normal pulses.  Pulmonary:     Effort: Pulmonary effort is normal.  Genitourinary:    Comments: Deferred Musculoskeletal:        General: Normal range of motion.     Cervical back: Normal range of motion.  Skin:    General: Skin is warm and dry.  Neurological:     General: No focal deficit present.     Mental Status: He is alert and oriented to person, place, and time.   Review of Systems  Constitutional:  Negative for chills and fever.  HENT:  Negative for congestion and sore throat.   Eyes:  Negative for blurred vision.  Respiratory:  Negative for cough, shortness of breath and wheezing.   Cardiovascular:  Negative for chest pain and palpitations.  Gastrointestinal:  Negative for abdominal pain, constipation, diarrhea, heartburn, nausea and vomiting.  Genitourinary:  Negative for dysuria.  Musculoskeletal:  Negative for joint pain and myalgias.  Skin: Negative.   Neurological:  Negative for dizziness, tingling, tremors, sensory change, speech change, focal weakness, seizures, loss of consciousness, weakness and headaches.  Psychiatric/Behavioral:  Positive for depression ("Improving") and substance abuse (Hx. alcohol use disorder, chronic). Negative for hallucinations, memory loss and suicidal ideas. The patient is nervous/anxious. The patient does not have insomnia.   Blood pressure 111/62, pulse 79, temperature 98.5 F (36.9 C), temperature source Oral, resp. rate 18, height 5\' 6"  (1.676 m), weight 65.3 kg, SpO2 97 %. Body mass index is 23.24 kg/m.  Treatment Plan  Summary: Daily contact with patient to assess and evaluate symptoms and progress in treatment and Medication management.  Continue inpatient hospitalization.  Will continue today 08/10/2021 plan as below except where it is noted.   Diagnoses.  Alcohol use disorder. Major depressive disorder.  High blood pressure.  Gouty arthritis.    2. Medication management to reduce current symptoms to base line and improve the patient's overall level of functioning: See [facility] for plan of care.   Alcohol withdrawal syndrome.  Continue gabapentin 200 mg po tid.   Major depression.  Increase Sertraline from 50 mg to 75  mg po daily, first increase dose on 08/10/2021.   Anxiety.  -Start Buspar 5 mg PO TID Continue Hydroxyzine 25 mg po tid prn.   Insomnia.  Continue Trazodone 50 mg po daily prn.   Other medical issues, continue, amlodipine 5 mg po daily for HTN. Indocin 25 mg po tid prn for gout.  Mylanta 30 ml po Q 4 hrs prn for indigestion.  MOM 30 ml po daily prn for constipation.  Encourage group attendance/participation. Discharge disposition plan is ongoing.   Ethelene Hal, NP 08/10/2021, 2:14 PM

## 2021-08-10 NOTE — BHH Group Notes (Signed)
Adult Psychoeducational Group Not Date:  08/10/2021 Time:  0900-1045 Group Topic/Focus: PROGRESSIVE RELAXATION. A group where deep breathing is taught and tensing and relaxation muscle groups is used. Imagery is used as well.  Pts are asked to imagine 3 pillars that hold them up when they are not able to hold themselves up.  Participation Level:  Active  Participation Quality:  Appropriate  Affect:  Appropriate  Cognitive:  Oriented  Insight: Improving  Engagement in Group:  Engaged  Modes of Intervention:  Activity, Discussion, Education, and Support  Additional Comments:  Rates his energy at a 7/10. States what holds him up is God and his daughter.  Dione Housekeeper

## 2021-08-10 NOTE — BHH Group Notes (Signed)
Psychoeducational Group Note  Date:  08/03/2021 Time:  1300-1400   Group Topic/Focus: This is a continuation of the group from Saturday. Pt's have been asked to formulate a list of 30 positives about themselves. This list is to be read 2 times a day for 30 days, looking in a mirror. Changing patterns of negative self talk. Also discussed is the fact that there have been some people who hurt us in the past. We keep that memory alive within us. Ways to cope with this are discused   Participation Level:  Did not attend Oscar Hank A  

## 2021-08-10 NOTE — Progress Notes (Signed)
Pt denies SI/HI/AVH and verbally agrees to approach staff if these become apparent or before harming themselves/others. Rates depression 0/10. Rates anxiety 7/10. Rates pain 0/10. Pt stated that he was not getting the best sleep but did take time to get some rest during the day. Pt was upset this morning when someone from [facility] told him that he was not approved. Social workers notified and found out that he still is approved. Pt notified and in a much better mood. Scheduled medications administered to Pt, per MD orders. RN provided support and encouragement to Pt. Q15 min safety checks implemented and continued. Pt safe on the unit. RN will continue to monitor and intervene as needed.   08/10/21 0748  Psych Admission Type (Psych Patients Only)  Admission Status Voluntary  Psychosocial Assessment  Patient Complaints Anxiety  Eye Contact Fair  Facial Expression Animated  Affect Appropriate to circumstance  Speech Logical/coherent  Interaction Assertive  Motor Activity Other (Comment) (WDL)  Appearance/Hygiene Unremarkable  Behavior Characteristics Appropriate to situation;Cooperative;Anxious  Mood Pleasant  Aggressive Behavior  Effect No apparent injury  Thought Process  Coherency WDL  Content WDL  Delusions None reported or observed  Perception WDL  Hallucination None reported or observed  Judgment Impaired  Confusion None  Danger to Self  Current suicidal ideation? Denies  Self-Injurious Behavior No self-injurious ideation or behavior indicators observed or expressed   Agreement Not to Harm Self Yes  Description of Agreement contracts for safety verbal  Danger to Others  Danger to Others None reported or observed  Danger to Others Abnormal  Harmful Behavior to others No threats or harm toward other people  Destructive Behavior No threats or harm toward property

## 2021-08-10 NOTE — BHH Counselor (Signed)
Clinical Social Work Note  At doctor request, CSW contacted [facility] to inquire about the patient's acceptance status.  They were able to look it up and shared that he is on the wait list with an indication he is approved for admission.  They were unaware of any reason this would have changed.  Ambrose Mantle, LCSW 08/10/2021, 9:53 AM

## 2021-08-11 DIAGNOSIS — F411 Generalized anxiety disorder: Secondary | ICD-10-CM | POA: Diagnosis not present

## 2021-08-11 MED ORDER — TRAZODONE HCL 100 MG PO TABS
100.0000 mg | ORAL_TABLET | Freq: Every day | ORAL | Status: DC
  Administered 2021-08-11 – 2021-08-12 (×2): 100 mg via ORAL
  Filled 2021-08-11 (×5): qty 1

## 2021-08-11 MED ORDER — BUSPIRONE HCL 10 MG PO TABS
10.0000 mg | ORAL_TABLET | Freq: Three times a day (TID) | ORAL | Status: DC
  Administered 2021-08-11 – 2021-08-14 (×10): 10 mg via ORAL
  Filled 2021-08-11 (×8): qty 1
  Filled 2021-08-11 (×2): qty 21
  Filled 2021-08-11 (×3): qty 1
  Filled 2021-08-11: qty 21
  Filled 2021-08-11: qty 1

## 2021-08-11 MED ORDER — TRAZODONE HCL 100 MG PO TABS
100.0000 mg | ORAL_TABLET | Freq: Every day | ORAL | Status: DC
  Filled 2021-08-11: qty 1

## 2021-08-11 NOTE — BHH Group Notes (Signed)
Adult Psychoeducational Group Note  Date:  08/11/2021 Time:  4:16 PM  Group Topic/Focus:  Self Care:   The focus of this group is to help patients understand the importance of self-care in order to improve or restore emotional, physical, spiritual, interpersonal, and financial health.  Participation Level:  Active  Participation Quality:  Appropriate  Affect:  Appropriate  Cognitive:  Alert  Insight: Appropriate  Engagement in Group:  Engaged  Modes of Intervention:  Activity  Additional Comments:  Patient attended and participated in the Psycho-Ed group.  Jearl Klinefelter 08/11/2021, 4:16 PM

## 2021-08-11 NOTE — Plan of Care (Signed)
Nurse discussed anxiety, depression and coping skills with patient.  

## 2021-08-11 NOTE — BHH Group Notes (Signed)
[name] Group Notes:  (Nursing/MHT/Case Management/Adjunct)  Date:  08/11/2021  Time:  9:26 PM  Type of Therapy:  Psychoeducational Skills  Participation Level:  Minimal  Participation Quality:  Attentive  Affect:  Appropriate  Cognitive:  Appropriate  Insight:  Appropriate  Engagement in Group:  Lacking  Modes of Intervention:  Education  Summary of Progress/Problems: The patient attended the evening A.A. meeting and was appropriate.   Hazle Coca S 08/11/2021, 9:26 PM

## 2021-08-11 NOTE — Hospital Course (Addendum)
60 year old Hispanic male with hx of alcohol use disorder & MDD admitted [facility] the [facility] with complain of passive suicidal ideations due to being rejected at the [facility] residential treatment center due to insurance issues.  He is improving with sertraline 75 mg, gabapentin 200 mg 3 times daily, BuSpar 10 mg 3 times daily, trazodone 100 mg nightly.   He is still reporting anxiety and insomnia. He has been accepted to [facility] but has to call every day for bed availability.  Plan on adjusting medication to improve anxiety and insomnia.

## 2021-08-11 NOTE — Group Note (Signed)
Occupational Therapy Group Note  Group Topic:Feelings Management  Group Date: 08/11/2021 Start Time: 1400 End Time: 1440 Facilitators: Donne Hazel, OT/L    Group Description:Group encouraged increased engagement and participation through discussion focused on Building Happiness. Patients were provided a handout and reviewed therapeutic strategies to build happiness including identifying gratitudes, random acts of kindness, exercise, meditation, positive journaling, and fostering relationships. Patients engaged in discussion and encouraged to reflect on each strategy and their experiences.  Therapeutic Goal(s): Identify strategies to build happiness. Identify and implement therapeutic strategies to improve overall mood. Practice and identify gratitudes, random acts of kindness, exercise, meditation, positive journaling, and fostering relationships  Participation Level: Active   Participation Quality: Minimal Cues   Behavior: Cooperative and Interactive    Speech/Thought Process: Focused   Affect/Mood: Full range   Insight: Fair   Judgement: Fair   Individualization: Davionte was active in their participation of group discussion/activity. Pt identified "my 9 grandkids" as something that currently brings them happiness. Pt identified "exercise" as the activity they could challenge themselves to do for 21 days to build happiness into their life/routine.   Modes of Intervention: Activity, Discussion, and Education  Patient Response to Interventions:  Attentive and Engaged   Plan: Continue to engage patient in OT groups 2 - 3x/week.  08/11/2021  Donne Hazel, OT/L

## 2021-08-11 NOTE — Progress Notes (Signed)
   08/11/21 2000  Psych Admission Type (Psych Patients Only)  Admission Status Voluntary  Psychosocial Assessment  Patient Complaints Anxiety  Eye Contact Fair  Facial Expression Animated  Affect Appropriate to circumstance  Speech Logical/coherent  Interaction Assertive  Motor Activity Other (Comment) (wnl)  Appearance/Hygiene Unremarkable  Behavior Characteristics Cooperative;Anxious  Mood Anxious;Pleasant  Thought Process  Coherency WDL  Content WDL  Delusions None reported or observed  Perception WDL  Hallucination None reported or observed  Judgment Impaired  Confusion None  Danger to Self  Current suicidal ideation? Denies  Danger to Others  Danger to Others None reported or observed   Pt seen in the hallway. Pt denies SI, HI, AVH and pain. Pt rates anxiety 6/10 and denies any depression. Endorses sleeplessness. Pt informed that his Trazodone dosage was increased tonight.

## 2021-08-11 NOTE — Group Note (Signed)
Recreation Therapy Group Note   Group Topic:Stress Management  Group Date: 08/11/2021 Start Time: 0935 End Time: 0955 Facilitators: Caroll Rancher, LRT,CTRS Location: [address] Dayroom   Goal Area(s) Addresses:  Patient will identify members of their support system. Patient will acknowledge benefit of healthy supports in daily life. Patient will identify any negative relationships in their support system and discuss alternatives.  Patient will verbalize positive effect of healthy supports post d/c.   Group Description:  Meditation.  LRT played a meditation that focused on releasing anxiety through slow controlled breathing, being able to acknowledge thoughts and feelings without acting on them and releasing things beyond your control.    Affect/Mood: Appropriate   Participation Level: Active   Participation Quality: Independent   Behavior: Appropriate   Speech/Thought Process: Focused   Insight: Good   Judgement: Good   Modes of Intervention: Meditation, Nature Sounds   Patient Response to Interventions:  Attentive   Education Outcome:  Acknowledges education and In group clarification offered    Clinical Observations/Individualized Feedback: Pt attended and participated in activity.    Plan: Continue to engage patient in RT group sessions 2-3x/week.   Caroll Rancher, Antonietta Jewel 08/11/2021 12:59 PM

## 2021-08-11 NOTE — BHH Group Notes (Signed)
  Spiritual care group on grief and loss facilitated by chaplain Dyanne Carrel, [facility]   Group Goal:   Support / Education around grief and loss   Members engage in facilitated group support and psycho-social education.   Group Description:   Following introductions and group rules, group members engaged in facilitated group dialog and support around topic of loss, with particular support around experiences of loss in their lives. Group Identified types of loss (relationships / self / things) and identified patterns, circumstances, and changes that precipitate losses. Reflected on thoughts / feelings around loss, normalized grief responses, and recognized variety in grief experience. Group noted Worden's four tasks of grief in discussion.   Group drew on Adlerian / Rogerian, narrative, MI,   Patient Progress:  Willie Collins attended group.  Although he did not contribute verbally, he showed engagement by making eye contact and nodding as peers shared.  Chaplain Dyanne Carrel, Bcc Pager, [phone] 8:25 PM

## 2021-08-11 NOTE — Progress Notes (Signed)
D:  Patient's self inventory sheet, patient has poor sleep, sleep medication helpful.  Good appetite,  normal energy level, good concentration.  Rated depression 5, hopeless 1, anxiety 7.  Withdrawals, chilling, snoring.  Denied physical problems.  Denied physical pain.  Goal is work on recovery.  Does what he has to do.  Does have discharge plans. A:  Medications administered per MD orders.  Emotional support and encouragement given patient. R:  Denied SI and HI.  Denied A/V hallucinations.  Safety maintained with 15 minute checks.

## 2021-08-11 NOTE — Progress Notes (Signed)
[facility] MD Progress Note  08/11/2021 11:15 AM Willie Collins  MRN:  [id number]  Reason for admission:  60 year old Hispanic male with hx of alcohol use disorder & major depressive disorder. He is admitted to the [facility] from the [facility] with complain of passive suicidal ideations due to being rejected at the [facility] residential treatment center due to insurance issues.   24 EMR reviewed. Patient discussed in progression rounds with treatment team. MAR was reviewed and Pt is complaint with scheduled medications and required PRN medications Tylenol and Vistaril. Nursing notes indicate that Pt slept for 5 hours.   Evaluation on the unit today: Patient was seen,  chart reviewed and case discussed with the treatment team.  Pt states his mood is still depressed and he is anxious. Pt rates his depression at 5/10 and anxiety at  anxiety at 7/10 (10 being highest severity). Pt didn't sleep well last night and was anxious about his probation.  He states he spent 18 months in federal prison for drug possession and he is on 1 year probation for DWI and 5 months still left.  He states that he was ordered to do community service but  he was unable to do because of his leg pain previously and now due to being hospitalized.  He states he does not want to violate his probation and does not want to go to jail.  He states he will try to contact probation officer today.  He states the social worker told him that he has been accepted to [facility] but he has to call every day for bed availability.  He is planning to [facility] again today.  He reports tremors and anxiety as withdrawal symptoms but denies any other withdrawal symptoms such as sweating, nausea, and vomiting.  Pt states his appetite is good.  Currently, Pt denies any suicidal ideation, homicidal ideation and, visual and auditory hallucination. Pt denies any headache, nausea, vomiting, dizziness, chest pain, SOB, abdominal pain, diarrhea, and constipation. Willie Collins states he is  tolerating his Zoloft and Buspar. He states Buspar is helping with his anxiety. Discussed increasing Buspar.  Patient agrees with the plan.  Pt denies any concerns.   Principal Problem: MDD (major depressive disorder), recurrent severe, without psychosis (HCC)  Diagnosis: Principal Problem:   MDD (major depressive disorder), recurrent severe, without psychosis (HCC) Active Problems:   Alcohol use disorder, severe, dependence (HCC)   GAD (generalized anxiety disorder)  Total Time spent with patient:  25 minutes  Past Psychiatric History: Alcohol use disorder, Mdd, GAD.  Past Medical History:  Past Medical History:  Diagnosis Date   Hypertension     Past Surgical History:  Procedure Laterality Date   IRRIGATION AND DEBRIDEMENT FOOT Left 11/25/2020   Procedure: IRRIGATION AND DEBRIDEMENT FOOT;  Surgeon: Candelaria Stagers, DPM;  Location: [facility];  Service: Podiatry;  Laterality: Left;   Family History: History reviewed. No pertinent family history.  Family Psychiatric  History: See H&P.  Social History:  Social History   Substance and Sexual Activity  Alcohol Use Yes   Comment: 1/5 liquor per day     Social History   Substance and Sexual Activity  Drug Use Never    Social History   Socioeconomic History   Marital status: Divorced    Spouse name: Not on file   Number of children: 4   Years of education: 12   Highest education level: GED or equivalent  Occupational History   Not on file  Tobacco Use  Smoking status: Never   Smokeless tobacco: Never  Vaping Use   Vaping Use: Never used  Substance and Sexual Activity   Alcohol use: Yes    Comment: 1/5 liquor per day   Drug use: Never   Sexual activity: Yes    Partners: Female  Other Topics Concern   Not on file  Social History Narrative   Not on file   Social Determinants of Health   Financial Resource Strain: Not on file  Food Insecurity: Not on file  Transportation Needs: Not on file  Physical Activity:  Not on file  Stress: Not on file  Social Connections: Not on file   Additional Social History:   Sleep: Fair   Appetite:  Good  Current Medications: Current Facility-Administered Medications  Medication Dose Route Frequency Provider Last Rate Last Admin   acetaminophen (TYLENOL) tablet 650 mg  650 mg Oral Q6H PRN Massengill, Ovid Curd, MD   650 mg at 08/10/21 1653   alum & mag hydroxide-simeth (MAALOX/MYLANTA) 200-200-20 MG/5ML suspension 30 mL  30 mL Oral Q4H PRN Rankin, Shuvon B, NP       amLODipine (NORVASC) tablet 5 mg  5 mg Oral Daily Rankin, Shuvon B, NP   5 mg at 08/11/21 0736   busPIRone (BUSPAR) tablet 10 mg  10 mg Oral TID Armando Reichert, MD       gabapentin (NEURONTIN) capsule 200 mg  200 mg Oral TID Rankin, Shuvon B, NP   200 mg at 08/11/21 0736   hydrOXYzine (ATARAX/VISTARIL) tablet 25 mg  25 mg Oral TID PRN Rankin, Shuvon B, NP   25 mg at 08/11/21 0735   indomethacin (INDOCIN) capsule 25 mg  25 mg Oral TID PRN Rankin, Shuvon B, NP   25 mg at 08/06/21 2131   magnesium hydroxide (MILK OF MAGNESIA) suspension 30 mL  30 mL Oral Daily PRN Rankin, Shuvon B, NP       sertraline (ZOLOFT) tablet 75 mg  75 mg Oral Daily Ethelene Hal, NP   75 mg at 08/11/21 0736   [START ON 08/12/2021] traZODone (DESYREL) tablet 100 mg  100 mg Oral QHS Armando Reichert, MD       Lab Results: No results found for this or any previous visit (from the past 48 hour(s)).  Blood Alcohol level:  Lab Results  Component Value Date   ETH <10 08/04/2021   ETH 277 (H) [id number]   Metabolic Disorder Labs: Lab Results  Component Value Date   HGBA1C 5.4 07/29/2021   MPG 108.28 07/29/2021   MPG 108.28 11/26/2020   No results found for: PROLACTIN Lab Results  Component Value Date   CHOL 259 (H) 07/29/2021   TRIG 62 07/29/2021   HDL >135 07/29/2021   CHOLHDL NOT CALCULATED 07/29/2021   VLDL 12 07/29/2021   LDLCALC NOT CALCULATED 07/29/2021   Physical Findings: AIMS:  , ,  ,  ,    CIWA:     COWS:     Musculoskeletal: Strength & Muscle Tone: within normal limits Gait & Station: normal Patient leans: N/A  Psychiatric Specialty Exam:  Presentation  General Appearance: Appropriate for Environment; Casual  Eye Contact:Good  Speech:Normal Rate  Speech Volume:Normal  Handedness:Right  Mood and Affect  Mood:Depressed; Anxious  Affect:Constricted  Thought Process  Thought Processes:Coherent; Goal Directed; Linear  Descriptions of Associations:Intact  Orientation:Full (Time, Place and Person)  Thought Content:Perseveration; Tangential; Rumination  History of Schizophrenia/Schizoaffective disorder:No  Duration of Psychotic Symptoms:N/A  Hallucinations:no patient denies  Ideas of Reference:None  Suicidal Thoughts:No patient denies  Homicidal Thoughts:No, patient denies  Sensorium  Memory:Recent Good; Remote Good; Immediate Good  Judgment:Fair  Insight:Fair  Executive Functions  Concentration:Fair  Attention Span:Fair  [place]  Language:Good  Psychomotor Activity  Psychomotor Activity:Normal  Assets  Assets:Communication Skills; Desire for Improvement; Physical Health; Resilience  Sleep  Sleep: Fair, 5.5 hours, patient complains of dreaming all night  Physical Exam: Physical Exam Vitals and nursing note reviewed.  Constitutional:      General: He is not in acute distress.    Appearance: He is not ill-appearing, toxic-appearing or diaphoretic.  HENT:     Head: Normocephalic and atraumatic.     Nose: Nose normal.  Pulmonary:     Effort: Pulmonary effort is normal.  Genitourinary:    Comments: Deferred Neurological:     General: No focal deficit present.     Mental Status: He is alert and oriented to person, place, and time.   Review of Systems  Constitutional:  Negative for chills and fever.  Eyes:  Negative for blurred vision.  Respiratory:  Negative for cough, shortness of breath and wheezing.    Cardiovascular:  Negative for chest pain and palpitations.  Gastrointestinal:  Negative for abdominal pain, constipation, diarrhea, heartburn, nausea and vomiting.  Genitourinary:  Negative for dysuria.  Musculoskeletal:  Negative for joint pain and myalgias.  Neurological:  Positive for tremors. Negative for dizziness, focal weakness and headaches.  Psychiatric/Behavioral:  Positive for depression ("Improving") and substance abuse (Hx. alcohol use disorder, chronic). Negative for hallucinations, memory loss and suicidal ideas. The patient is nervous/anxious. The patient does not have insomnia.   Blood pressure (!) 139/93, pulse 62, temperature (!) 97.5 F (36.4 C), temperature source Oral, resp. rate 18, height 5\' 6"  (1.676 m), weight 65.3 kg, SpO2 100 %. Body mass index is 23.24 kg/m.  Treatment Plan Summary:60 year old Hispanic male with hx of alcohol use disorder & major depressive disorder. He is admitted to the [facility] from the [facility] with complain of passive suicidal ideations due to being rejected at the [facility] residential treatment center due to insurance issues.   Daily contact with patient to assess and evaluate symptoms and progress in treatment and Medication management.  Continue inpatient hospitalization.  Will continue today 08/11/2021 plan as below except where it is noted.   Diagnoses.  Alcohol use disorder. Major depressive disorder.  High blood pressure.  Gouty arthritis.    2. Medication management to reduce current symptoms to base line and improve the patient's overall level of functioning: See [facility] for plan of care.   Alcohol withdrawal syndrome.  Continue gabapentin 200 mg po tid.   Major depression.  Continue Sertraline 75 mg po daily.   Anxiety.  Increase Buspar to 10 mg PO TID Continue Hydroxyzine 25 mg po tid prn.   Insomnia.  Increase trazodone to 100 mg po daily at bedtime.   Other medical issues, continue, amlodipine 5 mg po daily for HTN. Indocin  25 mg po tid prn for gout.  Mylanta 30 ml po Q 4 hrs prn for indigestion.  MOM 30 ml po daily prn for constipation.  Encourage group attendance/participation. Discharge disposition plan is ongoing.   Armando Reichert, MD PGY2 08/11/2021, 11:15 AM

## 2021-08-11 NOTE — Progress Notes (Signed)
Adult Psychoeducational Group Note  Date:  08/11/2021 Time:  11:09 AM  Group Topic/Focus:  Goals Group:   The focus of this group is to help patients establish daily goals to achieve during treatment and discuss how the patient can incorporate goal setting into their daily lives to aide in recovery.  Participation Level:  Active  Participation Quality:  Appropriate  Affect:  Appropriate  Cognitive:  Appropriate  Insight: Appropriate  Engagement in Group:  Engaged  Modes of Intervention:  Discussion  Additional Comments: Patient attended orientation/goal group and said that his goal for today is to stay towards his treatment.   Willie Collins Suppa 08/11/2021, 11:09 AM

## 2021-08-11 NOTE — Group Note (Signed)
LCSW Group Therapy Note  Group Date: 08/11/2021 Start Time: 1300 End Time: 1400   Type of Therapy and Topic:  Group Therapy - Healthy vs Unhealthy Coping Skills  Participation Level:  Active   Description of Group The focus of this group was to determine what unhealthy coping techniques typically are used by group members and what healthy coping techniques would be helpful in coping with various problems. Patients were guided in becoming aware of the differences between healthy and unhealthy coping techniques. Patients were asked to identify 2-3 healthy coping skills they would like to learn to use more effectively.  Therapeutic Goals Patients learned that coping is what human beings do all day long to deal with various situations in their lives Patients defined and discussed healthy vs unhealthy coping techniques Patients identified their preferred coping techniques and identified whether these were healthy or unhealthy Patients determined 2-3 healthy coping skills they would like to become more familiar with and use more often. 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 Patient proved open to input from peers and feedback from CSW. Patient demonstrated insight into the subject matter, was respectful of peers, and participated throughout the entire session.   Therapeutic Modalities Cognitive Behavioral Therapy Motivational Interviewing  [name], Theresia Majors 08/11/2021  2:00 PM

## 2021-08-12 MED ORDER — GABAPENTIN 300 MG PO CAPS
300.0000 mg | ORAL_CAPSULE | Freq: Three times a day (TID) | ORAL | Status: DC
  Administered 2021-08-12 – 2021-08-14 (×6): 300 mg via ORAL
  Filled 2021-08-12 (×3): qty 1
  Filled 2021-08-12: qty 21
  Filled 2021-08-12: qty 1
  Filled 2021-08-12: qty 21
  Filled 2021-08-12: qty 1
  Filled 2021-08-12: qty 21
  Filled 2021-08-12 (×3): qty 1

## 2021-08-12 NOTE — Plan of Care (Signed)
Nurse discussed anxiety, depression and coping skills with patient.  

## 2021-08-12 NOTE — Progress Notes (Signed)
Patient stated he called [facility].  Man will call him back in the morning. Patient will be the first person he speaks to in the morning.  Patient stated he feels better about the situation.

## 2021-08-12 NOTE — Progress Notes (Signed)
The patient's positive event for the day is that he had a good talk with his daughter. His goal for tomorrow is to try and keep up with the treatment.

## 2021-08-12 NOTE — [facility] Suicide Risk Assessment (Addendum)
[name] INPATIENT:  Family/Significant Other Suicide Prevention Education  Suicide Prevention Education:  Contact Attempts:  Daughter, Karrie Meres [phone]), (name of family member/significant other) has been identified by the patient as the family member/significant other with whom the patient will be residing, and identified as the person(s) who will aid the patient in the event of a mental health crisis.  With written consent from the patient, two attempts were made to provide suicide prevention education, prior to and/or following the patient's discharge.  We were unsuccessful in providing suicide prevention education.  A suicide education pamphlet was given to the patient to share with family/significant other.  Date and time of first attempt:08/12/21  /  11:36am. CSW left a HIPAA compliant voicemail. Date and time of second attempt: 08/12/21   /  2:47pm  [name] 08/12/2021, 11:37 AM

## 2021-08-12 NOTE — Progress Notes (Signed)
[name] MD Progress Note  08/12/2021 11:58 AM Willie Collins  MRN:  [id number]  Reason for admission:  60 year old Hispanic male with hx of alcohol use disorder & major depressive disorder. He is admitted to the [facility] from the [facility] with complain of passive suicidal ideations due to being rejected at the [facility] residential treatment center due to insurance issues.   24 EMR reviewed. Patient discussed in progression rounds with treatment team. MAR was reviewed and Pt is complaint with scheduled medications and required PRN medications Vistaril. Nursing notes indicate that Pt slept for 5.75 hours.   Evaluation on the unit today: Patient was seen, chart reviewed and case discussed with the treatment team.  Pt states his mood is better now and he feels less anxious today.  Pt rates his depression at 2/10 and anxiety at  anxiety at 5/10 (10 being highest severity).  He states he called his Research officer, trade union, federal and state officers and they did not raise any problem and told him that they understand and will not arrest him.  He states he is much more relaxed now. He states he called [facility] yesterday and they told him that bed may be available on this coming Saturday.  He states he wants to stay in the hospital until he can go to [facility] as he is worried that he will relapse.  Discussed that we are not able to keep him here longer to wait for bed and planning to discharge him in a day or 2.  He verbalizes understanding.  He states he can go back to his daughter's house but won't have transportation from daughter's house to [facility]. He still reports mild tremors and anxiety as withdrawal symptoms but denies any other withdrawal symptoms such as sweating, nausea, and vomiting.  Pt states his appetite is good.  Currently, Pt denies any suicidal ideation, homicidal ideation and, visual and auditory hallucination. Pt denies any headache, nausea, vomiting, dizziness, chest pain, SOB, abdominal pain, diarrhea, and constipation.  Malick states he is tolerating his medications. Pt denies any concerns.   Principal Problem: MDD (major depressive disorder), recurrent severe, without psychosis ([place])  Diagnosis: Principal Problem:   MDD (major depressive disorder), recurrent severe, without psychosis ([place]) Active Problems:   Alcohol use disorder, severe, dependence ([place])   GAD (generalized anxiety disorder)  Total Time spent with patient:  25 minutes  Past Psychiatric History: Alcohol use disorder, Mdd, GAD.  Past Medical History:  Past Medical History:  Diagnosis Date   Hypertension     Past Surgical History:  Procedure Laterality Date   IRRIGATION AND DEBRIDEMENT FOOT Left 11/25/2020   Procedure: IRRIGATION AND DEBRIDEMENT FOOT;  Surgeon: Felipa Furnace, DPM;  Location: [facility];  Service: Podiatry;  Laterality: Left;   Family History: History reviewed. No pertinent family history.  Family Psychiatric  History: See H&P.  Social History:  Social History   Substance and Sexual Activity  Alcohol Use Yes   Comment: 1/5 liquor per day     Social History   Substance and Sexual Activity  Drug Use Never    Social History   Socioeconomic History   Marital status: Divorced    Spouse name: Not on file   Number of children: 4   Years of education: 12   Highest education level: GED or equivalent  Occupational History   Not on file  Tobacco Use   Smoking status: Never   Smokeless tobacco: Never  Vaping Use   Vaping Use: Never used  Substance and Sexual Activity   Alcohol use: Yes    Comment: 1/5 liquor per day   Drug use: Never   Sexual activity: Yes    Partners: Female  Other Topics Concern   Not on file  Social History Narrative   Not on file   Social Determinants of Health   Financial Resource Strain: Not on file  Food Insecurity: Not on file  Transportation Needs: Not on file  Physical Activity: Not on file  Stress: Not on file  Social Connections: Not on file   Additional Social  History:   Sleep: Good   Appetite:  Good  Current Medications: Current Facility-Administered Medications  Medication Dose Route Frequency Provider Last Rate Last Admin   acetaminophen (TYLENOL) tablet 650 mg  650 mg Oral Q6H PRN Massengill, Harrold Donath, MD   650 mg at 08/10/21 1653   alum & mag hydroxide-simeth (MAALOX/MYLANTA) 200-200-20 MG/5ML suspension 30 mL  30 mL Oral Q4H PRN Collins, Shuvon B, NP       amLODipine (NORVASC) tablet 5 mg  5 mg Oral Daily Collins, Shuvon B, NP   5 mg at 08/12/21 0748   busPIRone (BUSPAR) tablet 10 mg  10 mg Oral TID Karsten Ro, MD   10 mg at 08/12/21 1155   gabapentin (NEURONTIN) capsule 200 mg  200 mg Oral TID Collins, Shuvon B, NP   200 mg at 08/12/21 1154   hydrOXYzine (ATARAX/VISTARIL) tablet 25 mg  25 mg Oral TID PRN Collins, Shuvon B, NP   25 mg at 08/11/21 2109   indomethacin (INDOCIN) capsule 25 mg  25 mg Oral TID PRN Collins, Shuvon B, NP   25 mg at 08/06/21 2131   magnesium hydroxide (MILK OF MAGNESIA) suspension 30 mL  30 mL Oral Daily PRN Collins, Shuvon B, NP       sertraline (ZOLOFT) tablet 75 mg  75 mg Oral Daily Laveda Abbe, NP   75 mg at 08/12/21 0749   traZODone (DESYREL) tablet 100 mg  100 mg Oral QHS Melbourne Abts W, PA-C   100 mg at 08/11/21 2110   Lab Results: No results found for this or any previous visit (from the past 48 hour(s)).  Blood Alcohol level:  Lab Results  Component Value Date   ETH <10 08/04/2021   ETH 277 (H) 07/29/2021   Metabolic Disorder Labs: Lab Results  Component Value Date   HGBA1C 5.4 07/29/2021   MPG 108.28 07/29/2021   MPG 108.28 11/26/2020   No results found for: PROLACTIN Lab Results  Component Value Date   CHOL 259 (H) 07/29/2021   TRIG 62 07/29/2021   HDL >135 07/29/2021   CHOLHDL NOT CALCULATED 07/29/2021   VLDL 12 07/29/2021   LDLCALC NOT CALCULATED 07/29/2021   Physical Findings: AIMS:  , ,  ,  ,    CIWA:    COWS:     Musculoskeletal: Strength & Muscle Tone: within normal  limits Gait & Station: normal Patient leans: N/A  Psychiatric Specialty Exam:  Presentation  General Appearance: Appropriate for Environment; Casual  Eye Contact:Good  Speech:Normal Rate  Speech Volume:Normal  Handedness:Right  Mood and Affect  Mood:Dysphoric; Anxious  Affect:Appropriate; Full Range  Thought Process  Thought Processes:Coherent; Goal Directed  Descriptions of Associations:Intact  Orientation:Full (Time, Place and Person)  Thought Content:Logical  History of Schizophrenia/Schizoaffective disorder:No  Duration of Psychotic Symptoms:N/A  Hallucinations:no patient denies  Ideas of Reference:None  Suicidal Thoughts:No patient denies  Homicidal Thoughts:No, patient denies  Sensorium  Memory:Recent Good;  Immediate Good  Judgment:Fair  Insight:Fair  Executive Functions  Concentration:Fair  Attention Span:Fair  [place]  Language:Good  Psychomotor Activity  Psychomotor Activity:Normal  Assets  Assets:Communication Skills; Desire for Improvement; Physical Health; Resilience  Sleep  Sleep: Good Physical Exam: Physical Exam Vitals and nursing note reviewed.  Constitutional:      General: He is not in acute distress.    Appearance: He is not ill-appearing, toxic-appearing or diaphoretic.  HENT:     Head: Normocephalic and atraumatic.     Nose: Nose normal.  Pulmonary:     Effort: Pulmonary effort is normal.  Genitourinary:    Comments: Deferred Neurological:     General: No focal deficit present.     Mental Status: He is alert and oriented to person, place, and time.   Review of Systems  Constitutional:  Negative for chills and fever.  Eyes:  Negative for blurred vision.  Respiratory:  Negative for cough, shortness of breath and wheezing.   Cardiovascular:  Negative for chest pain and palpitations.  Gastrointestinal:  Negative for abdominal pain, constipation, diarrhea, heartburn, nausea and  vomiting.  Genitourinary:  Negative for dysuria.  Musculoskeletal:  Negative for joint pain and myalgias.  Neurological:  Positive for tremors. Negative for dizziness, focal weakness and headaches.  Psychiatric/Behavioral:  Positive for depression ("Improving") and substance abuse (Hx. alcohol use disorder, chronic). Negative for hallucinations, memory loss and suicidal ideas. The patient is nervous/anxious. The patient does not have insomnia.   Blood pressure (!) 158/83, pulse 66, temperature 97.7 F (36.5 C), temperature source Oral, resp. rate 18, height 5\' 6"  (1.676 m), weight 65.3 kg, SpO2 100 %. Body mass index is 23.24 kg/m.  Treatment Plan Summary:60 year old Hispanic male with hx of alcohol use disorder & major depressive disorder. He is admitted to the [facility] from the [facility] with complain of passive suicidal ideations due to being rejected at the [facility] residential treatment center due to insurance issues.   Daily contact with patient to assess and evaluate symptoms and progress in treatment and Medication management.  Continue inpatient hospitalization.  Will continue today 08/12/2021 plan as below except where it is noted.   Diagnoses.  Alcohol use disorder. Major depressive disorder.  High blood pressure.  Gouty arthritis.    2. Medication management to reduce current symptoms to base line and improve the patient's overall level of functioning: See [facility] for plan of care.   Alcohol withdrawal syndrome.  Increase gabapentin 300 mg po tid for cravings.    Major depression.  Continue Sertraline 75 mg po daily.   Anxiety.  Continue Buspar 10 mg PO TID Continue Hydroxyzine 25 mg po tid prn.   Insomnia.  Continue trazodone  100 mg po daily at bedtime.   Other medical issues, continue, amlodipine 5 mg po daily for HTN. Indocin 25 mg po tid prn for gout.  Mylanta 30 ml po Q 4 hrs prn for indigestion.  MOM 30 ml po daily prn for constipation.  Encourage group  attendance/participation. Discharge disposition plan is ongoing.   Armando Reichert, MD PGY2 08/12/2021, 11:58 AM

## 2021-08-12 NOTE — Progress Notes (Signed)
   08/12/21 2035  Psych Admission Type (Psych Patients Only)  Admission Status Voluntary  Psychosocial Assessment  Patient Complaints Anxiety;Depression  Eye Contact Fair  Facial Expression Anxious  Affect Appropriate to circumstance  Speech Logical/coherent  Interaction Assertive  Motor Activity Other (Comment) (wnl)  Appearance/Hygiene Unremarkable  Behavior Characteristics Cooperative;Anxious  Mood Depressed;Anxious;Pleasant  Thought Process  Coherency WDL  Content WDL  Delusions None reported or observed  Perception WDL  Hallucination None reported or observed  Judgment Impaired  Confusion None  Danger to Self  Current suicidal ideation? Denies  Danger to Others  Danger to Others None reported or observed   Pt seen in the dayroom. Pt denies SI, HI, AVH and pain. Pt rates anxiety 5/10 and depression 4/10. Says most of his anxiety was earlier today when he was told that the offer of a bed at [facility] was revoked because he has been there within the last year. "I thought I had been there over a year ago but it has only been 7 months." Pt hopeful about another facility, [facility] in [place]. Spoke with someone there this evening and will be calling first thing in the morning to see about a bed.  Pt also endorses vivid dreams when taking Trazodone for sleep. Will let this writer know if he experiences them again. Will then ask provider for another sleep aid.

## 2021-08-12 NOTE — Group Note (Signed)
Recreation Therapy Group Note   Group Topic:Animal Assisted Therapy   Group Date: 08/12/2021 Start Time: 1430 End Time: 1515 Facilitators: Caroll Rancher, LRT,CTRS Location: [address]  AAA/T Program Assumption of Risk Form signed by Patient/ or Parent Legal Guardian Yes  Patient understands his/her participation is voluntary Yes   Affect/Mood: N/A   Participation Level: Did not attend    Clinical Observations/Individualized Feedback:     Plan: Continue to engage patient in RT group sessions 2-3x/week.   Caroll Rancher, Antonietta Jewel 08/12/2021 3:40 PM

## 2021-08-12 NOTE — Progress Notes (Signed)
D:  Patient's self inventory sheet, patient sleeps good, sleep medication helpful.  Good appetite, normal energy level, good concentration.  Rated 5 depression, 4 hopeless and anxiety 7.   Withdrawals. Runny nose, shaking.  Denied SI.  Denied physical problems.  Denied pain.   Goal is to follow up.  Plans to do the best he can and keep trying.  Does have discharge plans. A:  Medications administered per MD orders.  Emotional support and encouragement given pt.  Dose have discharge plans. R:  Patient denied SI and HI, contracts for safety.  Denied A/V hallucinations.  Safety maintained with 15 minute checks.

## 2021-08-13 ENCOUNTER — Encounter (HOSPITAL_COMMUNITY): Payer: Self-pay

## 2021-08-13 MED ORDER — PRAZOSIN HCL 1 MG PO CAPS
1.0000 mg | ORAL_CAPSULE | Freq: Every day | ORAL | Status: DC
  Administered 2021-08-13: 1 mg via ORAL
  Filled 2021-08-13 (×2): qty 1
  Filled 2021-08-13: qty 7

## 2021-08-13 NOTE — BHH Counselor (Signed)
Clinical Social Work Note   At doctor request, CSW contacted [facility] to inquire about the patient's acceptance status.  They were able to look it up and shared that due to him being their in April and completing the program, the state will not pay for him to receive treatment there again this year.   CSW notified pt and provided him with a resource for [employer].  Fredirick Lathe, LCSWA Clinicial Social Worker [employer]

## 2021-08-13 NOTE — Group Note (Signed)
LCSW Group Therapy Note   Group Date: 08/13/2021 Start Time: 1300 End Time: 1400 Type of Therapy and Topic:  Group Therapy:  Self-Esteem   Participation Level:  Did Not Attend  Description of Group: This group addressed positive self-esteem. Patients were given a worksheet with a blank shield. Patients were asked what a shield is and when it is used. Patients were asked to list, draw, or write protective factors in the their lives on their shields. Patients discussed the words, ideas and drawings that they put on their shield. Patients were encouraged to have a daily reflection of positive characteristics/ protective factors.  Therapeutic Goals Patient will verbalize two of their positive qualities Patient will demonstrate insight but naming social supports in their lives Patient will verbalize their feelings when voicing positive self affirmations and when voicing positive affirmations of others Patients will discuss the potential positive impact on their wellness/recovery of focusing on positive traits of self and others.    [name], LCSWA 08/13/2021  2:01 PM

## 2021-08-13 NOTE — Progress Notes (Signed)
[facility] MD Progress Note  08/13/2021 3:07 PM Willie Collins  MRN:  [id number]  Reason for admission:  60 year old Hispanic male with hx of alcohol use disorder & major depressive disorder. He is admitted to the [facility] from the [facility] with complain of passive suicidal ideations due to being rejected at the [facility] residential treatment center due to insurance issues.   24 EMR reviewed. Patient discussed in progression rounds with treatment team. MAR was reviewed and Pt is complaint with scheduled medications and required PRN medications Vistaril and Tylenol. Nursing notes indicate that Pt slept for 5.75 hours.   Evaluation on the unit today: Patient was seen, chart reviewed and case discussed with the treatment team.  Pt states his mood was agitated yesterday because he called [facility] and they told him that he is not able to come back as he was there in April.  He states the Child psychotherapist gave him few other numbers to call.  He [facility].  He is requesting more resources before discharge.  He states he did not sleep well last night and had scary nightmares.  He states he woke up at 3:00 AM and had difficulty falling asleep again.  He states he does not want to take trazodone and want to try Minipress. He states he can go back to his daughter's house after discharge.  He denies any withdrawal symptoms other than mild tremors.  Pt states his appetite is good.  Currently, Pt denies any suicidal ideation, homicidal ideation and, visual and auditory hallucination. Pt denies any headache, nausea, vomiting, dizziness, chest pain, SOB, abdominal pain, diarrhea, and constipation. Willie Collins states he is tolerating his medications. Pt denies any concerns.   Principal Problem: MDD (major depressive disorder), recurrent severe, without psychosis (HCC)  Diagnosis: Principal Problem:   MDD (major depressive disorder), recurrent severe, without psychosis (HCC) Active Problems:   Alcohol use  disorder, severe, dependence (HCC)   GAD (generalized anxiety disorder)  Total Time spent with patient:  25 minutes  Past Psychiatric History: Alcohol use disorder, Mdd, GAD.  Past Medical History:  Past Medical History:  Diagnosis Date   Hypertension     Past Surgical History:  Procedure Laterality Date   IRRIGATION AND DEBRIDEMENT FOOT Left 11/25/2020   Procedure: IRRIGATION AND DEBRIDEMENT FOOT;  Surgeon: Willie Collins, DPM;  Location: [facility];  Service: Podiatry;  Laterality: Left;   Family History: History reviewed. No pertinent family history.  Family Psychiatric  History: See H&P.  Social History:  Social History   Substance and Sexual Activity  Alcohol Use Yes   Comment: 1/5 liquor per day     Social History   Substance and Sexual Activity  Drug Use Never    Social History   Socioeconomic History   Marital status: Divorced    Spouse name: Not on file   Number of children: 4   Years of education: 12   Highest education level: GED or equivalent  Occupational History   Not on file  Tobacco Use   Smoking status: Never   Smokeless tobacco: Never  Vaping Use   Vaping Use: Never used  Substance and Sexual Activity   Alcohol use: Yes    Comment: 1/5 liquor per day   Drug use: Never   Sexual activity: Yes    Partners: Female  Other Topics Concern   Not on file  Social History Narrative   Not on file   Social Determinants of Health  Financial Resource Strain: Not on file  Food Insecurity: Not on file  Transportation Needs: Not on file  Physical Activity: Not on file  Stress: Not on file  Social Connections: Not on file   Additional Social History:   Sleep: Poor   Appetite:  Good  Current Medications: Current Facility-Administered Medications  Medication Dose Route Frequency Provider Last Rate Last Admin   acetaminophen (TYLENOL) tablet 650 mg  650 mg Oral Q6H PRN Willie Collins, Willie Curd, MD   650 mg at 08/13/21 1302   alum & mag hydroxide-simeth  (MAALOX/MYLANTA) 200-200-20 MG/5ML suspension 30 mL  30 mL Oral Q4H PRN Collins, Willie B, NP       amLODipine (NORVASC) tablet 5 mg  5 mg Oral Daily Collins, Willie B, NP   5 mg at 08/13/21 0830   busPIRone (BUSPAR) tablet 10 mg  10 mg Oral TID Armando Reichert, MD   10 mg at 08/13/21 1143   gabapentin (NEURONTIN) capsule 300 mg  300 mg Oral TID Armando Reichert, MD   300 mg at 08/13/21 1143   hydrOXYzine (ATARAX/VISTARIL) tablet 25 mg  25 mg Oral TID PRN Collins, Willie B, NP   25 mg at 08/13/21 0829   indomethacin (INDOCIN) capsule 25 mg  25 mg Oral TID PRN Collins, Willie B, NP   25 mg at 08/06/21 2131   magnesium hydroxide (MILK OF MAGNESIA) suspension 30 mL  30 mL Oral Daily PRN Collins, Willie B, NP       sertraline (ZOLOFT) tablet 75 mg  75 mg Oral Daily Willie Hal, NP   75 mg at 08/13/21 0829   traZODone (DESYREL) tablet 100 mg  100 mg Oral QHS Margorie John W, PA-C   100 mg at 08/12/21 2106   Lab Results: No results found for this or any previous visit (from the past 48 hour(s)).  Blood Alcohol level:  Lab Results  Component Value Date   ETH <10 08/04/2021   ETH 277 (H) [id number]   Metabolic Disorder Labs: Lab Results  Component Value Date   HGBA1C 5.4 07/29/2021   MPG 108.28 07/29/2021   MPG 108.28 11/26/2020   No results found for: PROLACTIN Lab Results  Component Value Date   CHOL 259 (H) 07/29/2021   TRIG 62 07/29/2021   HDL >135 07/29/2021   CHOLHDL NOT CALCULATED 07/29/2021   VLDL 12 07/29/2021   LDLCALC NOT CALCULATED 07/29/2021   Physical Findings: AIMS:  , ,  ,  ,    CIWA:    COWS:     Musculoskeletal: Strength & Muscle Tone: within normal limits Gait & Station: normal Patient leans: N/A  Psychiatric Specialty Exam:  Presentation  General Appearance: Appropriate for Environment; Casual  Eye Contact:Good  Speech:Normal Rate  Speech Volume:Normal  Handedness:Right  Mood and Affect  Mood:Anxious; Dysphoric  Affect:Appropriate; Full  Range  Thought Process  Thought Processes:Coherent; Goal Directed  Descriptions of Associations:Intact  Orientation:Full (Time, Place and Person)  Thought Content:Logical  History of Schizophrenia/Schizoaffective disorder:No  Duration of Psychotic Symptoms:N/A  Hallucinations:no patient denies  Ideas of Reference:None  Suicidal Thoughts:No patient denies  Homicidal Thoughts:No, patient denies  Sensorium  Memory:Recent Good; Immediate Good  Judgment:Fair  Insight:Fair  Executive Functions  Concentration:Fair  Attention Span:Fair  [place]  Language:Good  Psychomotor Activity  Psychomotor Activity:Normal  Assets  Assets:Communication Skills; Desire for Improvement; Physical Health; Resilience  Sleep  Sleep: Good Physical Exam: Physical Exam Vitals and nursing note reviewed.  Constitutional:  General: He is not in acute distress.    Appearance: He is not ill-appearing, toxic-appearing or diaphoretic.  HENT:     Head: Normocephalic and atraumatic.     Nose: Nose normal.  Pulmonary:     Effort: Pulmonary effort is normal.  Neurological:     General: No focal deficit present.     Mental Status: He is alert and oriented to person, place, and time.   Review of Systems  Constitutional:  Negative for chills and fever.  Eyes:  Negative for blurred vision.  Respiratory:  Negative for cough, shortness of breath and wheezing.   Cardiovascular:  Negative for chest pain and palpitations.  Gastrointestinal:  Negative for abdominal pain, constipation, diarrhea, heartburn, nausea and vomiting.  Genitourinary:  Negative for dysuria.  Musculoskeletal:  Negative for joint pain and myalgias.  Neurological:  Positive for tremors. Negative for dizziness, focal weakness and headaches.  Psychiatric/Behavioral:  Positive for depression ("Improving") and substance abuse (Hx. alcohol use disorder, chronic). Negative for hallucinations, memory  loss and suicidal ideas. The patient is nervous/anxious. The patient does not have insomnia.   Blood pressure (!) 152/101, pulse 66, temperature (!) 97.5 F (36.4 C), temperature source Oral, resp. rate 18, height 5\' 6"  (1.676 m), weight 65.3 kg, SpO2 100 %. Body mass index is 23.24 kg/m.  Treatment Plan Summary:59 year old Hispanic male with hx of alcohol use disorder & major depressive disorder. He is admitted to the [facility] from the [facility] with complain of passive suicidal ideations due to being rejected at the  [facility] residential treatment center due to insurance issues.   Daily contact with patient to assess and evaluate symptoms and progress in treatment and Medication management.  Continue inpatient hospitalization.  Will continue today 08/13/2021 plan as below except where it is noted.   Diagnoses.  Alcohol use disorder. Major depressive disorder.  High blood pressure.  Gouty arthritis.    2. Medication management to reduce current symptoms to base line and improve the patient's overall level of functioning: See [facility] for plan of care.   Alcohol withdrawal syndrome.  Continue gabapentin 300 mg po tid for cravings.    Major depression.  Continue Sertraline 75 mg po daily.   Anxiety.  Continue Buspar 10 mg PO TID Continue Hydroxyzine 25 mg po tid prn.   Insomnia Stop trazodone  -Start Minipress 1 mg nightly.    Other medical issues, continue, amlodipine 5 mg po daily for HTN. Indocin 25 mg po tid prn for gout.  Mylanta 30 ml po Q 4 hrs prn for indigestion.  MOM 30 ml po daily prn for constipation.  Encourage group attendance/participation. Discharge disposition plan is ongoing.   Armando Reichert, MD PGY2 08/13/2021, 3:07 PM

## 2021-08-13 NOTE — BH IP Treatment Plan (Signed)
Interdisciplinary Treatment and Diagnostic Plan Update  08/13/2021 Willie Collins MRN: [id number]  Principal Diagnosis: MDD (major depressive disorder), recurrent severe, without psychosis (HCC)  Secondary Diagnoses: Principal Problem:   MDD (major depressive disorder), recurrent severe, without psychosis (HCC) Active Problems:   Alcohol use disorder, severe, dependence (HCC)   GAD (generalized anxiety disorder)   Current Medications:  Current Facility-Administered Medications  Medication Dose Route Frequency Provider Last Rate Last Admin   acetaminophen (TYLENOL) tablet 650 mg  650 mg Oral Q6H PRN Massengill, Harrold Donath, MD   650 mg at 08/13/21 1302   alum & mag hydroxide-simeth (MAALOX/MYLANTA) 200-200-20 MG/5ML suspension 30 mL  30 mL Oral Q4H PRN Rankin, Shuvon B, NP       amLODipine (NORVASC) tablet 5 mg  5 mg Oral Daily Rankin, Shuvon B, NP   5 mg at 08/13/21 0830   busPIRone (BUSPAR) tablet 10 mg  10 mg Oral TID Karsten Ro, MD   10 mg at 08/13/21 1143   gabapentin (NEURONTIN) capsule 300 mg  300 mg Oral TID Karsten Ro, MD   300 mg at 08/13/21 1143   hydrOXYzine (ATARAX/VISTARIL) tablet 25 mg  25 mg Oral TID PRN Rankin, Shuvon B, NP   25 mg at 08/13/21 0829   indomethacin (INDOCIN) capsule 25 mg  25 mg Oral TID PRN Rankin, Shuvon B, NP   25 mg at 08/06/21 2131   magnesium hydroxide (MILK OF MAGNESIA) suspension 30 mL  30 mL Oral Daily PRN Rankin, Shuvon B, NP       sertraline (ZOLOFT) tablet 75 mg  75 mg Oral Daily Laveda Abbe, NP   75 mg at 08/13/21 2637   traZODone (DESYREL) tablet 100 mg  100 mg Oral QHS Melbourne Abts W, PA-C   100 mg at 08/12/21 2106   PTA Medications: Medications Prior to Admission  Medication Sig Dispense Refill Last Dose   amLODipine (NORVASC) 5 MG tablet Take 1 tablet (5 mg total) by mouth daily. 14 tablet 1    gabapentin (NEURONTIN) 100 MG capsule Take 2 capsules (200 mg total) by mouth 3 (three) times daily. 180 capsule 1     hydrOXYzine (ATARAX/VISTARIL) 25 MG tablet Take 1 tablet (25 mg total) by mouth 3 (three) times daily as needed for anxiety. 30 tablet 0    indomethacin (INDOCIN) 25 MG capsule Take 1 capsule (25 mg total) by mouth 3 (three) times daily as needed for moderate pain or mild pain (gout pain). 90 capsule 1    sertraline (ZOLOFT) 50 MG tablet Take 1 tablet (50 mg total) by mouth daily. 30 tablet 1    traZODone (DESYREL) 50 MG tablet Take 1 tablet (50 mg total) by mouth at bedtime as needed for sleep. 14 tablet 0     Patient Stressors: Neurosurgeon issue   Occupational concerns   Substance abuse    Patient Strengths: Ability for insight  Average or above average intelligence  Capable of independent living  Communication skills  General fund of knowledge  Motivation for treatment/growth  Physical Health  Supportive family/friends  Work skills   Treatment Modalities: Medication Management, Group therapy, Case management,  1 to 1 session with clinician, Psychoeducation, Recreational therapy.   Physician Treatment Plan for Primary Diagnosis: MDD (major depressive disorder), recurrent severe, without psychosis (HCC) Long Term Goal(s): Improvement in symptoms so as ready for discharge   Short Term Goals: Ability to identify changes in lifestyle to reduce recurrence of condition will improve Ability to verbalize  feelings will improve Ability to disclose and discuss suicidal ideas Ability to demonstrate self-control will improve Ability to identify and develop effective coping behaviors will improve Ability to maintain clinical measurements within normal limits will improve Compliance with prescribed medications will improve Ability to identify triggers associated with substance abuse/mental health issues will improve  Medication Management: Evaluate patient's response, side effects, and tolerance of medication regimen.  Therapeutic Interventions: 1 to 1 sessions, Unit  Group sessions and Medication administration.  Evaluation of Outcomes: Progressing  Physician Treatment Plan for Secondary Diagnosis: Principal Problem:   MDD (major depressive disorder), recurrent severe, without psychosis ([place]) Active Problems:   Alcohol use disorder, severe, dependence ([place])   GAD (generalized anxiety disorder)  Long Term Goal(s): Improvement in symptoms so as ready for discharge   Short Term Goals: Ability to identify changes in lifestyle to reduce recurrence of condition will improve Ability to verbalize feelings will improve Ability to disclose and discuss suicidal ideas Ability to demonstrate self-control will improve Ability to identify and develop effective coping behaviors will improve Ability to maintain clinical measurements within normal limits will improve Compliance with prescribed medications will improve Ability to identify triggers associated with substance abuse/mental health issues will improve     Medication Management: Evaluate patient's response, side effects, and tolerance of medication regimen.  Therapeutic Interventions: 1 to 1 sessions, Unit Group sessions and Medication administration.  Evaluation of Outcomes: Progressing   RN Treatment Plan for Primary Diagnosis: MDD (major depressive disorder), recurrent severe, without psychosis ([place]) Long Term Goal(s): Knowledge of disease and therapeutic regimen to maintain health will improve  Short Term Goals: Ability to participate in decision making will improve, Ability to verbalize feelings will improve, and Ability to identify and develop effective coping behaviors will improve  Medication Management: RN will administer medications as ordered by provider, will assess and evaluate patient's response and provide education to patient for prescribed medication. RN will report any adverse and/or side effects to prescribing provider.  Therapeutic Interventions: 1 on 1 counseling sessions,  Psychoeducation, Medication administration, Evaluate responses to treatment, Monitor vital signs and CBGs as ordered, Perform/monitor CIWA, COWS, AIMS and Fall Risk screenings as ordered, Perform wound care treatments as ordered.  Evaluation of Outcomes: Progressing   LCSW Treatment Plan for Primary Diagnosis: MDD (major depressive disorder), recurrent severe, without psychosis ([place]) Long Term Goal(s): Safe transition to appropriate next level of care at discharge, Engage patient in therapeutic group addressing interpersonal concerns.  Short Term Goals: Engage patient in aftercare planning with referrals and resources, Increase social support, and Increase ability to appropriately verbalize feelings  Therapeutic Interventions: Assess for all discharge needs, 1 to 1 time with Social worker, Explore available resources and support systems, Assess for adequacy in community support network, Educate family and significant other(s) on suicide prevention, Complete Psychosocial Assessment, Interpersonal group therapy.  Evaluation of Outcomes: Progressing   Progress in Treatment: Attending groups: Yes. Participating in groups: Yes. Taking medication as prescribed: Yes. Toleration medication: Yes. Family/Significant other contact made: Yes, individual(s) contacted:  CSW attempted to call pt's daughter x2 Patient understands diagnosis: Yes. Discussing patient identified problems/goals with staff: Yes. Medical problems stabilized or resolved: Yes. Denies suicidal/homicidal ideation: Yes. Issues/concerns per patient self-inventory: No. Other: None  New problem(s) identified: No, Describe:  None  New Short Term/Long Term Goal(s):medication stabilization, elimination of SI thoughts, development of comprehensive mental wellness plan.   Patient Goals: Did Not Attend  Discharge Plan or Barriers: Pt will discharge to his daughter's home. Pt has  been given resources for [employer] for substance  use treatment.  Reason for Continuation of Hospitalization: Medication stabilization  Estimated Length of Stay: 3-5 days   Scribe for Treatment Team: Eliott Nine 08/13/2021 2:21 PM

## 2021-08-13 NOTE — Group Note (Signed)
Recreation Therapy Group Note   Group Topic:Stress Management  Group Date: 08/13/2021 Start Time: 0930 End Time: 6384 Facilitators: Caroll Rancher, LRT,CTRS Location: [address] Dayroom   Goal Area(s) Addresses:  Patient will actively participate in stress management techniques presented during session.  Patient will successfully identify benefit of practicing stress management post d/c.    Group Description: Guided Imagery. LRT provided education, instruction, and demonstration on practice of visualization via guided imagery. Patient was asked to participate in the technique introduced during session. LRT debriefed including topics of mindfulness, stress management and specific scenarios each patient could use these techniques. Patients were given suggestions of ways to access scripts post d/c and encouraged to explore Youtube and other apps available on smartphones, tablets, and computers.   Affect/Mood: N/A   Participation Level: Did not attend    Clinical Observations/Individualized Feedback:     Plan: Continue to engage patient in RT group sessions 2-3x/week.   Caroll Rancher, LRT,CTRS 08/13/2021 1:04 PM

## 2021-08-13 NOTE — Group Note (Signed)
Date:  08/13/2021 Time:  9:52 AM  Group Topic/Focus:  Goals Group:   The focus of this group is to help patients establish daily goals to achieve during treatment and discuss how the patient can incorporate goal setting into their daily lives to aide in recovery.    Participation Level:  Did Not Attend  Participation Quality:   d id not attend  Affect:   Did not attend  Cognitive:   did not attend  Insight: None  Engagement in Group:  None  Modes of Intervention:  Discussion  Additional Comments:  Patient did not attend.  Reymundo Poll 08/13/2021, 9:52 AM

## 2021-08-13 NOTE — BHH Group Notes (Signed)
Patient did not attend group.

## 2021-08-13 NOTE — Progress Notes (Signed)
The patient attended the evening N.A.meeting and was appropriate.  

## 2021-08-13 NOTE — Progress Notes (Signed)
Pt denies SI/HI/AVH and verbally agrees to approach staff if these become apparent or before harming themselves/others. Rates depression 5/10. Rates anxiety 6/10. Rates pain 0/10. Pt stated that his goal is to call places for treatment. Pt seems to be anxious of where he is going when he leaves the hospital. Pt talked to social worker about [facility] [place] but pt stated that, that is a little far from him. Scheduled medications administered to Pt, per MD orders. RN provided support and encouragement to Pt. Q15 min safety checks implemented and continued. Pt safe on the unit. RN will continue to monitor and intervene as needed.   08/13/21 0830  Psych Admission Type (Psych Patients Only)  Admission Status Voluntary  Psychosocial Assessment  Patient Complaints Anxiety;Depression  Eye Contact Fair  Facial Expression Anxious;Sad  Affect Depressed  Speech Logical/coherent  Interaction Assertive  Motor Activity Other (Comment) (wdl)  Appearance/Hygiene Unremarkable  Behavior Characteristics Cooperative;Calm  Mood Depressed;Anxious;Pleasant  Thought Process  Coherency WDL  Content WDL  Delusions None reported or observed  Perception WDL  Hallucination None reported or observed  Judgment Impaired  Confusion None  Danger to Self  Current suicidal ideation? Denies  Danger to Others  Danger to Others None reported or observed

## 2021-08-14 MED ORDER — SERTRALINE HCL 25 MG PO TABS: 75.0000 mg | ORAL_TABLET | Freq: Every day | ORAL | 0 refills | Status: DC

## 2021-08-14 MED ORDER — GABAPENTIN 300 MG PO CAPS: 300.0000 mg | ORAL_CAPSULE | Freq: Three times a day (TID) | ORAL | 0 refills | Status: DC

## 2021-08-14 MED ORDER — BUSPIRONE HCL 10 MG PO TABS: 10.0000 mg | ORAL_TABLET | Freq: Three times a day (TID) | ORAL | 0 refills | Status: AC

## 2021-08-14 MED ORDER — PRAZOSIN HCL 1 MG PO CAPS: 1.0000 mg | ORAL_CAPSULE | Freq: Every day | ORAL | 0 refills | Status: DC

## 2021-08-14 NOTE — Progress Notes (Signed)
     08/13/21 2142  Psych Admission Type (Psych Patients Only)  Admission Status Voluntary  Psychosocial Assessment  Patient Complaints Anxiety  Eye Contact Fair  Facial Expression Anxious  Affect Appropriate to circumstance  Speech Logical/coherent  Interaction Assertive  Motor Activity Other (Comment) (wnl)  Appearance/Hygiene Unremarkable  Behavior Characteristics Cooperative;Calm  Mood Pleasant;Anxious  Thought Process  Coherency WDL  Content WDL  Delusions None reported or observed  Perception WDL  Hallucination None reported or observed  Judgment Impaired  Confusion None  Danger to Self  Current suicidal ideation? Denies  Self-Injurious Behavior No self-injurious ideation or behavior indicators observed or expressed   Agreement Not to Harm Self Yes  Description of Agreement contracts for safety verbal  Danger to Others  Danger to Others None reported or observed  Danger to Others Abnormal  Harmful Behavior to others No threats or harm toward other people

## 2021-08-14 NOTE — Discharge Summary (Addendum)
Physician Discharge Summary Note  Patient:  Willie Collins is an 60 y.o., male MRN:  [id number] DOB:  1960-12-16 Patient phone:  [phone] (home)  Patient address:   [address] [address],  Total Time spent with patient: 45 minutes  Date of Admission:  08/06/2021 Date of Discharge: 08/14/2021  Reason for Admission:   He was admitted to the [facility] from the [facility] with complain of passive suicidal ideations due to being rejected at the [facility] residential treatment center due to insurance issues. Per chart review, patient had been admitted to the [facility] with complain of alcohol intoxication, inability to stop drinking heavily triggering suicidal ideations. And while at the [facility], he received alcohol detoxification treatments using the [facility] protocols. After detox, he was sent to the [facility] residential to continue further substance abuse treatment. However, when he got there, he was denied admission due his medicaid being from [facility]. Patient then returned back to the [facility] citing passive suicidal thoughts. He was brought to the [facility] for further evaluation & treatment.  Principal Problem: MDD (major depressive disorder), recurrent severe, without psychosis ([place]) Discharge Diagnoses: Principal Problem:   MDD (major depressive disorder), recurrent severe, without psychosis ([place]) Active Problems:   Alcohol use disorder, severe, dependence ([place])   GAD (generalized anxiety disorder)   Past Psychiatric History: MDD, EtOH abuse  Past Medical History:  Past Medical History:  Diagnosis Date   Hypertension     Past Surgical History:  Procedure Laterality Date   IRRIGATION AND DEBRIDEMENT FOOT Left 11/25/2020   Procedure: IRRIGATION AND DEBRIDEMENT FOOT;  Surgeon: Felipa Furnace, DPM;  Location: [facility];  Service: Podiatry;  Laterality: Left;   Family History: History reviewed. No pertinent family history. Family Psychiatric  History: Sister - Schizophrenia Sister -  Bipolar Disorder Social History:  Social History   Substance and Sexual Activity  Alcohol Use Yes   Comment: 1/5 liquor per day     Social History   Substance and Sexual Activity  Drug Use Never    Social History   Socioeconomic History   Marital status: Divorced    Spouse name: Not on file   Number of children: 4   Years of education: 12   Highest education level: GED or equivalent  Occupational History   Not on file  Tobacco Use   Smoking status: Never   Smokeless tobacco: Never  Vaping Use   Vaping Use: Never used  Substance and Sexual Activity   Alcohol use: Yes    Comment: 1/5 liquor per day   Drug use: Never   Sexual activity: Yes    Partners: Female  Other Topics Concern   Not on file  Social History Narrative   Not on file   Social Determinants of Health   Financial Resource Strain: Not on file  Food Insecurity: Not on file  Transportation Needs: Not on file  Physical Activity: Not on file  Stress: Not on file  Social Connections: Not on file    Hospital Course:   During hospitalization, patient was evaluated by the psychiatric team.  They received multiple disciplinary care.  After initial intake evaluation, it was decided to adjust medications. He was kept on his Zoloft and Gabapentin and had them titrated.  He was also started on Prazosin and Buspar.  He responded well to these changes.. Patient not having side effects to psychiatric medications.   Patient received supportive psychotherapy and was encouraged to participate in group therapy during the hospitalization. Patient denied  having suicidal thoughts more than 48 hours prior to discharge.   On day of discharge patient reports he is doing ok but a little nervous.  He reports he slept well last night.  He reports good appetite.  He reports no SI, HI, or AVH.  He reports that he will have a positive outlook on things and intends to continue with his sobriety.  He reports that he will attempt to  get his Medicare switched to this county to open up more resources for him.  He reports he will try to do this today or tomorrow.  He states that it still his goal to go to residential treatment so that he stays with his sobriety.  He reports no issues with his medications.  He reports no other concerns at present.  He was discharged home.  Physical Findings:   Musculoskeletal: Strength & Muscle Tone: within normal limits Gait & Station: normal Patient leans: N/A   Psychiatric Specialty Exam:  Presentation  General Appearance: Appropriate for Environment; Casual; Fairly Groomed  Eye Contact:Good  Speech:Clear and Coherent; Normal Rate  Speech Volume:Normal  Handedness:Right   Mood and Affect  Mood:Anxious  Affect:Appropriate; Congruent   Thought Process  Thought Processes:Coherent; Goal Directed  Descriptions of Associations:Intact  Orientation:Full (Time, Place and Person)  Thought Content:Logical; WDL  History of Schizophrenia/Schizoaffective disorder:No  Duration of Psychotic Symptoms:N/A  Hallucinations:Hallucinations: None  Ideas of Reference:None  Suicidal Thoughts:Suicidal Thoughts: No  Homicidal Thoughts:Homicidal Thoughts: No   Sensorium  Memory:Immediate Good; Recent Good  Judgment:Fair  Insight:Fair   Executive Functions  Concentration:Good  Attention Span:Good  Recall:Good  Fund of Knowledge:Good  Language:Good   Psychomotor Activity  Psychomotor Activity:Psychomotor Activity: Normal   Assets  Assets:Communication Skills; Desire for Improvement; Physical Health; Resilience   Sleep  Sleep:Sleep: Good    Physical Exam: Physical Exam Vitals and nursing note reviewed.  Constitutional:      General: He is not in acute distress.    Appearance: Normal appearance. He is normal weight. He is not ill-appearing or toxic-appearing.  HENT:     Head: Normocephalic and atraumatic.  Pulmonary:     Effort: Pulmonary effort is  normal.  Musculoskeletal:        General: Normal range of motion.  Neurological:     General: No focal deficit present.     Mental Status: He is alert.   Review of Systems  Respiratory:  Negative for cough and shortness of breath.   Cardiovascular:  Negative for chest pain.  Gastrointestinal:  Negative for abdominal pain, constipation, diarrhea, nausea and vomiting.  Neurological:  Negative for weakness and headaches.  Psychiatric/Behavioral:  Negative for depression, hallucinations and suicidal ideas. The patient is nervous/anxious.   Blood pressure 136/81, pulse 64, temperature 98.4 F (36.9 C), temperature source Oral, resp. rate 18, height 5\' 6"  (1.676 m), weight 65.3 kg, SpO2 99 %. Body mass index is 23.24 kg/m.   Social History   Tobacco Use  Smoking Status Never  Smokeless Tobacco Never   Tobacco Cessation:  N/A, patient does not currently use tobacco products   Blood Alcohol level:  Lab Results  Component Value Date   ETH <10 08/04/2021   ETH 277 (H) 07/29/2021    Metabolic Disorder Labs:  Lab Results  Component Value Date   HGBA1C 5.4 07/29/2021   MPG 108.28 07/29/2021   MPG 108.28 11/26/2020   No results found for: PROLACTIN Lab Results  Component Value Date   CHOL 259 (H) 07/29/2021   TRIG  62 07/29/2021   HDL >135 07/29/2021   CHOLHDL NOT CALCULATED 07/29/2021   VLDL 12 07/29/2021   LDLCALC NOT CALCULATED 07/29/2021    See Psychiatric Specialty Exam and Suicide Risk Assessment completed by Attending Physician prior to discharge.  Discharge destination:  Home  Is patient on multiple antipsychotic therapies at discharge:  No   Has Patient had three or more failed trials of antipsychotic monotherapy by history:  No  Recommended Plan for Multiple Antipsychotic Therapies: NA  Discharge Instructions     Diet - low sodium heart healthy   Complete by: As directed    Increase activity slowly   Complete by: As directed       Allergies as of  08/14/2021   No Known Allergies      Medication List     STOP taking these medications    traZODone 50 MG tablet Commonly known as: DESYREL       TAKE these medications      Indication  amLODipine 5 MG tablet Commonly known as: NORVASC Take 1 tablet (5 mg total) by mouth daily.  Indication: High Blood Pressure Disorder   busPIRone 10 MG tablet Commonly known as: BUSPAR Take 1 tablet (10 mg total) by mouth 3 (three) times daily.  Indication: Anxiety Disorder   gabapentin 300 MG capsule Commonly known as: NEURONTIN Take 1 capsule (300 mg total) by mouth 3 (three) times daily. What changed:  medication strength how much to take  Indication: Neuropathic Pain   hydrOXYzine 25 MG tablet Commonly known as: ATARAX Take 1 tablet (25 mg total) by mouth 3 (three) times daily as needed for anxiety.  Indication: Feeling Anxious   indomethacin 25 MG capsule Commonly known as: INDOCIN Take 1 capsule (25 mg total) by mouth 3 (three) times daily as needed for moderate pain or mild pain (gout pain).  Indication: Acute Joint Inflammation in Gout, pain   prazosin 1 MG capsule Commonly known as: MINIPRESS Take 1 capsule (1 mg total) by mouth at bedtime.  Indication: Frightening Dreams   sertraline 25 MG tablet Commonly known as: ZOLOFT Take 3 tablets (75 mg total) by mouth daily. Start taking on: August 15, 2021 What changed:  medication strength how much to take  Indication: Major Depressive Disorder        Follow-up Information     [name]. Go on 08/14/2021.   Why: You have an appointment for medication management on 08/14/21 at 1:45 pm, and you also have an appointment for group therapy services on 08/14/21 at 6:00 pm. These appointments will be held in person. Contact information: [address], [place], [place] [address]  Phone: [phone]                Follow-up recommendations:  - Activity as tolerated. - Diet as  recommended by PCP. - Keep all scheduled follow-up appointments as recommended.  Comments: Patient is instructed to take all prescribed medications as recommended. Report any side effects or adverse reactions to your outpatient psychiatrist. Patient is instructed to abstain from alcohol and illegal drugs while on prescription medications. In the event of worsening symptoms, patient is instructed to call the crisis hotline, 911, or go to the nearest emergency department for evaluation and treatment.  Signed: Briant Cedar, MD 08/14/2021, 4:36 PM

## 2021-08-14 NOTE — Progress Notes (Signed)
  [facility] Adult Case Management Discharge Plan :  Will you be returning to the same living situation after discharge:  Yes,  daughter's home At discharge, do you have transportation home?: No.CSW will arrange Safe Transport Do you have the ability to pay for your medications: No.  Release of information consent forms completed and in the chart;  Patient's signature needed at discharge.  Patient to Follow up at:  Follow-up Information     [name]. Go on 08/14/2021.   Why: You have an appointment for medication management on 08/14/21 at 1:45 pm, and you also have an appointment for group therapy services on 08/14/21 at 6:00 pm. These appointments will be held in person. Contact information: [address], [place], [place] [address]  Phone: [phone]                Next level of care provider has access to [facility] Link:no  Safety Planning and Suicide Prevention discussed: Yes,  w/ pt     Has patient been referred to the Quitline?: N/A patient is not a smoker  Patient has been referred for addiction treatment: Yes  [name], LCSWA 08/14/2021, 11:18 AM

## 2021-08-14 NOTE — [facility] Group Notes (Signed)
Orientation group and PsychoED group. Patients were given a poem by Buddist Philosopher [place] regarding the power of our thoughts. Patients were then given examples of how positive reframing can help shape our thoughts and impact our mood. Patients were then asked to explore one negative thought they would like to let go of. Patient shared and was appropriate but came late to group.

## 2021-08-14 NOTE — Progress Notes (Signed)
RN met with pt and reviewed pt's discharge instructions. Pt verbalized understanding of discharge instructions and pt did not have any questions. RN reviewed and provided pt with a copy of SRA, AVS and Transition Record. RN returned pt's belongings to pt. Patient excited to leave and see family. Pt plans to work on following his discharge plans and finding a treatment center. Patient is hoping to go to [facility] and has motivation "to do what I need to do in order to get treatment." Prescriptions were given to pt. Pt denied SI/HI/AVH and voiced no concerns.  Pt was appreciative of the care pt received at [facility].  Patient discharged to the lobby without incident.   08/14/21 0825  Psych Admission Type (Psych Patients Only)  Admission Status Voluntary  Psychosocial Assessment  Patient Complaints Anxiety  Eye Contact Fair  Facial Expression Anxious  Affect Appropriate to circumstance  Speech Logical/coherent  Interaction Assertive  Motor Activity Other (Comment) (wdl)  Appearance/Hygiene Unremarkable  Behavior Characteristics Cooperative;Calm  Mood Anxious;Pleasant  Thought Process  Coherency WDL  Content WDL  Delusions None reported or observed  Perception WDL  Hallucination None reported or observed  Judgment WDL  Confusion None  Danger to Self  Current suicidal ideation? Denies  Danger to Others  Danger to Others None reported or observed  Danger to Others Abnormal  Harmful Behavior to others No threats or harm toward other people

## 2021-08-14 NOTE — BHH Suicide Risk Assessment (Addendum)
Suicide Risk Assessment  Discharge Assessment    [facility] Discharge Suicide Risk Assessment   Principal Problem: MDD (major depressive disorder), recurrent severe, without psychosis ([facility]) Discharge Diagnoses: Principal Problem:   MDD (major depressive disorder), recurrent severe, without psychosis ([facility]) Active Problems:   Alcohol use disorder, severe, dependence ([facility])   GAD (generalized anxiety disorder)   Total Time spent with patient: 45 minutes  Musculoskeletal: Strength & Muscle Tone: within normal limits Gait & Station: normal Patient leans: N/A  Psychiatric Specialty Exam  Presentation  General Appearance: Appropriate for Environment; Casual; Fairly Groomed  Eye Contact:Good  Speech:Clear and Coherent; Normal Rate  Speech Volume:Normal  Handedness:Right   Mood and Affect  Mood:Anxious  Duration of Depression Symptoms: Less than two weeks  Affect:Appropriate; Congruent   Thought Process  Thought Processes:Coherent; Goal Directed  Descriptions of Associations:Intact  Orientation:Full (Time, Place and Person)  Thought Content:Logical; WDL  History of Schizophrenia/Schizoaffective disorder:No  Duration of Psychotic Symptoms:N/A  Hallucinations:Hallucinations: None  Ideas of Reference:None  Suicidal Thoughts:Suicidal Thoughts: No  Homicidal Thoughts:Homicidal Thoughts: No   Sensorium  Memory:Immediate Good; Recent Good  Judgment:Fair  Insight:Fair   Executive Functions  Concentration:Good  Attention Span:Good  Recall:Good  Fund of Knowledge:Good  Language:Good   Psychomotor Activity  Psychomotor Activity:Psychomotor Activity: Normal   Assets  Assets:Communication Skills; Desire for Improvement; Physical Health; Resilience   Sleep  Sleep:Sleep: Good   Physical Exam: Physical Exam Vitals and nursing note reviewed.  Constitutional:      General: He is not in acute distress.    Appearance: Normal appearance. He is normal  weight. He is not ill-appearing or toxic-appearing.  HENT:     Head: Normocephalic and atraumatic.  Pulmonary:     Effort: Pulmonary effort is normal.  Musculoskeletal:        General: Normal range of motion.  Neurological:     General: No focal deficit present.     Mental Status: He is alert.   Review of Systems  Respiratory:  Negative for cough and shortness of breath.   Cardiovascular:  Negative for chest pain.  Gastrointestinal:  Negative for abdominal pain, constipation, diarrhea, nausea and vomiting.  Neurological:  Negative for weakness and headaches.  Psychiatric/Behavioral:  Negative for depression, hallucinations and suicidal ideas. The patient is nervous/anxious.   Blood pressure 136/81, pulse 64, temperature 98.4 F (36.9 C), temperature source Oral, resp. rate 18, height 5\' 6"  (1.676 m), weight 65.3 kg, SpO2 99 %. Body mass index is 23.24 kg/m.  Mental Status Per Nursing Assessment::   On Admission:  Suicidal ideation indicated by patient  Demographic Factors:  Male, Divorced or widowed, Low socioeconomic status, and Unemployed  Loss Factors: Decrease in vocational status, Legal issues, and Financial problems/change in socioeconomic status  Historical Factors: Impulsivity  Risk Reduction Factors:   Sense of responsibility to family and Living with another person, especially a relative  Continued Clinical Symptoms:  Alcohol/Substance Abuse/Dependencies  Cognitive Features That Contribute To Risk:  Thought constriction (tunnel vision)    Suicide Risk:  Minimal: No identifiable suicidal ideation.  Patients presenting with no risk factors but with morbid ruminations; may be classified as minimal risk based on the severity of the depressive symptoms   Follow-up Information     [name]. Go on 08/14/2021.   Why: You have an appointment for medication management on 08/14/21 at 1:45 pm, and you also have an appointment for group therapy services on  08/14/21 at 6:00 pm. These appointments will be held in  person. Contact information: [address], [place], [place] [address]  Phone: [phone]                Plan Of Care/Follow-up recommendations:  - Activity as tolerated. - Diet as recommended by PCP. - Keep all scheduled follow-up appointments as recommended.   Briant Cedar, MD 08/14/2021, 11:27 AM

## 2021-08-14 NOTE — Plan of Care (Signed)
°  Problem: Education: °Goal: Emotional status will improve °Outcome: Progressing °Goal: Mental status will improve °Outcome: Progressing °Goal: Verbalization of understanding the information provided will improve °Outcome: Progressing °  °

## 2021-10-06 ENCOUNTER — Ambulatory Visit: Payer: Self-pay | Admitting: Physician Assistant

## 2021-10-06 ENCOUNTER — Other Ambulatory Visit: Payer: Self-pay

## 2021-10-06 VITALS — BP 131/82 | HR 84 | Temp 98.2°F | Resp 18 | Ht 66.0 in | Wt 153.0 lb

## 2021-10-06 DIAGNOSIS — M5442 Lumbago with sciatica, left side: Secondary | ICD-10-CM

## 2021-10-06 DIAGNOSIS — Z1159 Encounter for screening for other viral diseases: Secondary | ICD-10-CM

## 2021-10-06 DIAGNOSIS — G8929 Other chronic pain: Secondary | ICD-10-CM

## 2021-10-06 DIAGNOSIS — E559 Vitamin D deficiency, unspecified: Secondary | ICD-10-CM

## 2021-10-06 DIAGNOSIS — Z8739 Personal history of other diseases of the musculoskeletal system and connective tissue: Secondary | ICD-10-CM

## 2021-10-06 DIAGNOSIS — Z6824 Body mass index (BMI) 24.0-24.9, adult: Secondary | ICD-10-CM

## 2021-10-06 DIAGNOSIS — I10 Essential (primary) hypertension: Secondary | ICD-10-CM

## 2021-10-06 DIAGNOSIS — F411 Generalized anxiety disorder: Secondary | ICD-10-CM

## 2021-10-06 DIAGNOSIS — F1020 Alcohol dependence, uncomplicated: Secondary | ICD-10-CM

## 2021-10-06 DIAGNOSIS — F332 Major depressive disorder, recurrent severe without psychotic features: Secondary | ICD-10-CM

## 2021-10-06 DIAGNOSIS — Z125 Encounter for screening for malignant neoplasm of prostate: Secondary | ICD-10-CM

## 2021-10-06 DIAGNOSIS — I700 Atherosclerosis of aorta: Secondary | ICD-10-CM

## 2021-10-06 MED ORDER — SERTRALINE HCL 25 MG PO TABS: 75.0000 mg | ORAL_TABLET | Freq: Every day | ORAL | 0 refills | Status: AC

## 2021-10-06 MED ORDER — AMLODIPINE BESYLATE 5 MG PO TABS: 5.0000 mg | ORAL_TABLET | Freq: Every day | ORAL | 1 refills | Status: AC

## 2021-10-06 MED ORDER — TRAZODONE HCL 50 MG PO TABS: 50.0000 mg | ORAL_TABLET | Freq: Every evening | ORAL | 1 refills | Status: AC | PRN

## 2021-10-06 MED ORDER — VITAMIN D3 50 MCG (2000 UT) PO CAPS: 4000.0000 [IU] | ORAL_CAPSULE | Freq: Every day | ORAL | 1 refills | Status: AC

## 2021-10-06 MED ORDER — BUSPIRONE HCL 10 MG PO TABS: 10.0000 mg | ORAL_TABLET | Freq: Three times a day (TID) | ORAL | 1 refills | Status: AC

## 2021-10-06 MED ORDER — MELOXICAM 15 MG PO TABS: 15.0000 mg | ORAL_TABLET | Freq: Every day | ORAL | 1 refills | Status: AC

## 2021-10-06 MED ORDER — QUETIAPINE FUMARATE 100 MG PO TABS: 100.0000 mg | ORAL_TABLET | Freq: Every day | ORAL | 1 refills | Status: AC

## 2021-10-06 MED ORDER — PRAZOSIN HCL 1 MG PO CAPS: 1.0000 mg | ORAL_CAPSULE | Freq: Every day | ORAL | 0 refills | Status: AC

## 2021-10-06 MED ORDER — GABAPENTIN 300 MG PO CAPS: 300.0000 mg | ORAL_CAPSULE | Freq: Three times a day (TID) | ORAL | 1 refills | Status: AC

## 2021-10-06 MED ORDER — ACAMPROSATE CALCIUM 333 MG PO TBEC: 333.0000 mg | DELAYED_RELEASE_TABLET | Freq: Three times a day (TID) | ORAL | 1 refills | Status: AC

## 2021-10-06 MED ORDER — METHOCARBAMOL 500 MG PO TABS: 500.0000 mg | ORAL_TABLET | Freq: Three times a day (TID) | ORAL | 1 refills | Status: AC | PRN

## 2021-10-06 MED ORDER — INDOMETHACIN 25 MG PO CAPS: 25.0000 mg | ORAL_CAPSULE | Freq: Three times a day (TID) | ORAL | 1 refills | Status: AC | PRN

## 2021-10-06 MED ORDER — LIDOCAINE 4 % EX PTCH: 1.0000 | MEDICATED_PATCH | Freq: Two times a day (BID) | CUTANEOUS | 1 refills | Status: AC | PRN

## 2021-10-06 NOTE — Patient Instructions (Signed)
To help better control your back pain, you are going to stop taking ibuprofen and start taking Mobic 15 mg once daily in the morning.  You will increase gabapentin 300 mg 3 times a day.  I have started a referral for you to be seen by orthopedics for further evaluation.  To help with your insomnia, you are going to increase Seroquel 100 mg, you can use trazodone as needed.  By improving your sleep, this should improve your overall anxiety.  We will call you with today's lab results.  Roney Jaffe, PA-C Physician Assistant [facility] [email or web address]   Chronic Back Pain When back pain lasts longer than 3 months, it is called chronic back pain. The cause of your back pain may not be known. Some common causes include: Wear and tear (degenerative disease) of the bones, ligaments, or disks in your back. Inflammation and stiffness in your back (arthritis). People who have chronic back pain often go through certain periods in which the pain is more intense (flare-ups). Many people can learn to manage the pain with home care. Follow these instructions at home: Pay attention to any changes in your symptoms. Take these actions to help with your pain: Managing pain and stiffness   If directed, apply ice to the painful area. Your health care provider may recommend applying ice during the first 24-48 hours after a flare-up begins. To do this: Put ice in a plastic bag. Place a towel between your skin and the bag. Leave the ice on for 20 minutes, 2-3 times per day. If directed, apply heat to the affected area as often as told by your health care provider. Use the heat source that your health care provider recommends, such as a moist heat pack or a heating pad. Place a towel between your skin and the heat source. Leave the heat on for 20-30 minutes. Remove the heat if your skin turns bright red. This is especially important if you are unable to  feel pain, heat, or cold. You may have a greater risk of getting burned. Try soaking in a warm tub. Activity  Avoid bending and other activities that make the problem worse. Maintain a proper position when standing or sitting: When standing, keep your upper back and neck straight, with your shoulders pulled back. Avoid slouching. When sitting, keep your back straight and relax your shoulders. Do not round your shoulders or pull them backward. Do not sit or stand in one place for long periods of time. Take brief periods of rest throughout the day. This will reduce your pain. Resting in a lying or standing position is usually better than sitting to rest. When you are resting for longer periods, mix in some mild activity or stretching between periods of rest. This will help to prevent stiffness and pain. Get regular exercise. Ask your health care provider what activities are safe for you. Do not lift anything that is heavier than 10 lb (4.5 kg), or the limit that you are told, until your health care provider says that it is safe. Always use proper lifting technique, which includes: Bending your knees. Keeping the load close to your body. Avoiding twisting. Sleep on a firm mattress in a comfortable position. Try lying on your side with your knees slightly bent. If you lie on your back, put a pillow under your knees. Medicines Treatment may include medicines for pain and inflammation taken by mouth or applied to the skin, prescription pain medicine, or muscle relaxants. Take  over-the-counter and prescription medicines only as told by your health care provider. Ask your health care provider if the medicine prescribed to you: Requires you to avoid driving or using machinery. Can cause constipation. You may need to take these actions to prevent or treat constipation: Drink enough fluid to keep your urine pale yellow. Take over-the-counter or prescription medicines. Eat foods that are high in fiber,  such as beans, whole grains, and fresh fruits and vegetables. Limit foods that are high in fat and processed sugars, such as fried or sweet foods. General instructions Do not use any products that contain nicotine or tobacco, such as cigarettes, e-cigarettes, and chewing tobacco. If you need help quitting, ask your health care provider. Keep all follow-up visits as told by your health care provider. This is important. Contact a health care provider if: You have pain that is not relieved with rest or medicine. Your pain gets worse, or you have new pain. You have a high fever. You have rapid weight loss. You have trouble doing your normal activities. Get help right away if: You have weakness or numbness in one or both of your legs or feet. You have trouble controlling your bladder or your bowels. You have severe back pain and have any of the following: Nausea or vomiting. Pain in your abdomen. Shortness of breath or you faint. Summary Chronic back pain is back pain that lasts longer than 3 months. When a flare-up begins, apply ice to the painful area for the first 24-48 hours. Apply a moist heat pad or use a heating pad on the painful area as directed by your health care provider. When you are resting for longer periods, mix in some mild activity or stretching between periods of rest. This will help to prevent stiffness and pain. This information is not intended to replace advice given to you by your health care provider. Make sure you discuss any questions you have with your health care provider. Document Revised: 10/11/2019 Document Reviewed: 10/11/2019 Elsevier Patient Education  2022 [employer].

## 2021-10-06 NOTE — Progress Notes (Signed)
New Patient Office Visit  Subjective:  Patient ID: Willie Collins, male    DOB: 12/10/1960  Age: 61 y.o. MRN: [id number]  CC:  Chief Complaint  Patient presents with   Anxiety   Insomnia    HPI Willie Collins reports that he is being treated for substance abuse at [facility] residential treatment center, states he has been there since January 7 does plan on completing a 30-day program and then returning back to his home in [place] where he will continue counseling services with [name] and following up with his probation officer.  States that he suffers from chronic back pain from 3 bulging disc, states that his pain has been worse recently, states that he also has difficulty with pain running down his left leg.  Denies injury or trauma.  States that he takes gabapentin 200 mg 3 times a day, Robaxin 3 times a day, uses lidocaine patches twice a day and ibuprofen 400 mg 3 times a day without relief.  Denies heartburn or acid reflux.  Reports that he has had many episodes of gout in the past, states he generally would get them in his right foot or his left elbow.  States he has not had an episode of gout since discontinuing alcohol.  States that he has been having difficulty both falling asleep and staying asleep, states that he has been taking prazosin and Seroquel without relief.  Reports that he was taking trazodone previously, but has not taking it for the last 3 nights, states that he started having "weird dreams" and was told it was probably the trazodone.  States that since stopping the trazodone 3 days ago he has continued to have the same dreams.  States that he was having difficulty falling asleep and staying asleep even with the addition of trazodone.  States that he has been checking his blood pressure on a daily basis, states that blood pressure readings have been within normal limits or slightly elevated.   Past Medical History:  Diagnosis Date    Hypertension     Past Surgical History:  Procedure Laterality Date   IRRIGATION AND DEBRIDEMENT FOOT Left 11/25/2020   Procedure: IRRIGATION AND DEBRIDEMENT FOOT;  Surgeon: Felipa Furnace, DPM;  Location: [facility];  Service: Podiatry;  Laterality: Left;    History reviewed. No pertinent family history.  Social History   Socioeconomic History   Marital status: Divorced    Spouse name: Not on file   Number of children: 4   Years of education: 12   Highest education level: GED or equivalent  Occupational History   Not on file  Tobacco Use   Smoking status: Never   Smokeless tobacco: Never  Vaping Use   Vaping Use: Never used  Substance and Sexual Activity   Alcohol use: Not Currently    Comment: 1/5 liquor per day   Drug use: Never   Sexual activity: Yes    Partners: Female  Other Topics Concern   Not on file  Social History Narrative   Not on file   Social Determinants of Health   Financial Resource Strain: Not on file  Food Insecurity: Not on file  Transportation Needs: Not on file  Physical Activity: Not on file  Stress: Not on file  Social Connections: Not on file  Intimate Partner Violence: Not on file    ROS Review of Systems  Constitutional: Negative.   HENT: Negative.    Eyes: Negative.   Respiratory:  Negative for shortness of  breath.   Cardiovascular:  Negative for chest pain.  Gastrointestinal: Negative.   Endocrine: Negative.   Genitourinary:  Negative for dysuria, scrotal swelling and testicular pain.  Musculoskeletal:  Positive for arthralgias, back pain and myalgias.  Skin: Negative.   Allergic/Immunologic: Negative.   Neurological: Negative.   Hematological: Negative.   Psychiatric/Behavioral:  Positive for sleep disturbance. Negative for dysphoric mood, self-injury and suicidal ideas. The patient is not nervous/anxious.    Objective:   Today's Vitals: BP 131/82 (BP Location: Left Arm, Patient Position: Sitting, Cuff Size: Normal)    Pulse  84    Temp 98.2 F (36.8 C) (Oral)    Resp 18    Ht 5\' 6"  (1.676 m)    Wt 153 lb (69.4 kg)    SpO2 100%    BMI 24.69 kg/m   Physical Exam Vitals and nursing note reviewed.  Constitutional:      Appearance: Normal appearance.  HENT:     Head: Normocephalic and atraumatic.     Right Ear: External ear normal.     Left Ear: External ear normal.     Nose: Nose normal.     Mouth/Throat:     Mouth: Mucous membranes are moist.     Pharynx: Oropharynx is clear.  Eyes:     Extraocular Movements: Extraocular movements intact.     Conjunctiva/sclera: Conjunctivae normal.     Pupils: Pupils are equal, round, and reactive to light.  Cardiovascular:     Rate and Rhythm: Normal rate and regular rhythm.     Pulses: Normal pulses.     Heart sounds: Normal heart sounds.  Pulmonary:     Effort: Pulmonary effort is normal.     Breath sounds: Normal breath sounds.  Musculoskeletal:     Cervical back: Normal range of motion and neck supple. No swelling. Normal range of motion.     Thoracic back: Bony tenderness present. No swelling. Decreased range of motion.     Lumbar back: Bony tenderness present. No swelling. Decreased range of motion.  Skin:    General: Skin is warm and dry.  Neurological:     General: No focal deficit present.     Mental Status: He is alert and oriented to person, place, and time.  Psychiatric:        Mood and Affect: Mood normal.        Behavior: Behavior normal.        Thought Content: Thought content normal.        Judgment: Judgment normal.    Assessment & Plan:   Problem List Items Addressed This Visit       Other   MDD (major depressive disorder), recurrent severe, without psychosis ([place])   Relevant Medications   prazosin (MINIPRESS) 1 MG capsule   traZODone (DESYREL) 50 MG tablet   sertraline (ZOLOFT) 25 MG tablet   QUEtiapine (SEROQUEL) 100 MG tablet   busPIRone (BUSPAR) 10 MG tablet   Other Relevant Orders   Basic Metabolic Panel (Completed)    Alcohol use disorder, severe, dependence ([place])   Relevant Medications   acamprosate (CAMPRAL) 333 MG tablet   GAD (generalized anxiety disorder)   Relevant Medications   traZODone (DESYREL) 50 MG tablet   sertraline (ZOLOFT) 25 MG tablet   busPIRone (BUSPAR) 10 MG tablet   Other Relevant Orders   TSH (Completed)   Other Visit Diagnoses     Essential hypertension    -  Primary   Relevant Medications   prazosin (MINIPRESS)  1 MG capsule   amLODipine (NORVASC) 5 MG tablet   Chronic bilateral low back pain with left-sided sciatica       Relevant Medications   traZODone (DESYREL) 50 MG tablet   indomethacin (INDOCIN) 25 MG capsule   gabapentin (NEURONTIN) 300 MG capsule   sertraline (ZOLOFT) 25 MG tablet   Cholecalciferol (VITAMIN D3) 50 MCG (2000 UT) capsule   acamprosate (CAMPRAL) 333 MG tablet   methocarbamol (ROBAXIN) 500 MG tablet   QUEtiapine (SEROQUEL) 100 MG tablet   busPIRone (BUSPAR) 10 MG tablet   Lidocaine (LIDOCAINE PAIN RELIEF) 4 % PTCH   meloxicam (MOBIC) 15 MG tablet   Other Relevant Orders   Ambulatory referral to Orthopedic Surgery   History of gout       Relevant Medications   indomethacin (INDOCIN) 25 MG capsule   Other Relevant Orders   Uric Acid (Completed)   Vitamin D deficiency       Relevant Orders   Vitamin D, 25-hydroxy (Completed)   Screening PSA (prostate specific antigen)       Relevant Orders   PSA (Completed)   Encounter for HCV screening test for low risk patient       Relevant Orders   HCV Ab w Reflex to Quant PCR (Completed)   Aortic atherosclerosis (HCC)       Relevant Medications   prazosin (MINIPRESS) 1 MG capsule   amLODipine (NORVASC) 5 MG tablet       Outpatient Encounter Medications as of 10/06/2021  Medication Sig   acamprosate (CAMPRAL) 333 MG tablet Take 1 tablet (333 mg total) by mouth 3 (three) times daily with meals.   busPIRone (BUSPAR) 10 MG tablet Take 1 tablet (10 mg total) by mouth 3 (three) times daily.    Cholecalciferol (VITAMIN D3) 50 MCG (2000 UT) capsule Take 2 capsules (4,000 Units total) by mouth daily.   hydrOXYzine (ATARAX/VISTARIL) 25 MG tablet Take 1 tablet (25 mg total) by mouth 3 (three) times daily as needed for anxiety.   Lidocaine (LIDOCAINE PAIN RELIEF) 4 % PTCH Apply 1 each topically 2 (two) times daily as needed.   meloxicam (MOBIC) 15 MG tablet Take 1 tablet (15 mg total) by mouth daily.   methocarbamol (ROBAXIN) 500 MG tablet Take 1 tablet (500 mg total) by mouth every 8 (eight) hours as needed for muscle spasms.   QUEtiapine (SEROQUEL) 100 MG tablet Take 1 tablet (100 mg total) by mouth at bedtime.   traZODone (DESYREL) 50 MG tablet Take 1 tablet (50 mg total) by mouth at bedtime as needed for sleep.   [DISCONTINUED] amLODipine (NORVASC) 5 MG tablet Take 1 tablet (5 mg total) by mouth daily.   [DISCONTINUED] gabapentin (NEURONTIN) 300 MG capsule Take 1 capsule (300 mg total) by mouth 3 (three) times daily.   [DISCONTINUED] indomethacin (INDOCIN) 25 MG capsule Take 1 capsule (25 mg total) by mouth 3 (three) times daily as needed for moderate pain or mild pain (gout pain).   [DISCONTINUED] prazosin (MINIPRESS) 1 MG capsule Take 1 capsule (1 mg total) by mouth at bedtime.   [DISCONTINUED] prazosin (MINIPRESS) 1 MG capsule Take by mouth.   [DISCONTINUED] sertraline (ZOLOFT) 25 MG tablet Take 3 tablets (75 mg total) by mouth daily.   amLODipine (NORVASC) 5 MG tablet Take 1 tablet (5 mg total) by mouth daily.   gabapentin (NEURONTIN) 300 MG capsule Take 1 capsule (300 mg total) by mouth 3 (three) times daily.   indomethacin (INDOCIN) 25 MG capsule Take 1 capsule (25  mg total) by mouth 3 (three) times daily as needed for moderate pain or mild pain (gout pain).   prazosin (MINIPRESS) 1 MG capsule Take 1 capsule (1 mg total) by mouth at bedtime.   sertraline (ZOLOFT) 25 MG tablet Take 3 tablets (75 mg total) by mouth daily.   No facility-administered encounter medications on file as of  10/06/2021.  1. Essential hypertension Continue current regimen.  Patient encouraged to continue checking blood pressure at home, keep a written log and have available for all office visits.  Patient to return to mobile unit in 2 weeks. - amLODipine (NORVASC) 5 MG tablet; Take 1 tablet (5 mg total) by mouth daily.  Dispense: 30 tablet; Refill: 1  2. MDD (major depressive disorder), recurrent severe, without psychosis ([place]) Increase Seroquel, patient encouraged to maintain follow-up with [name] after [facility] services have completed - prazosin (MINIPRESS) 1 MG capsule; Take 1 capsule (1 mg total) by mouth at bedtime.  Dispense: 30 capsule; Refill: 0 - traZODone (DESYREL) 50 MG tablet; Take 1 tablet (50 mg total) by mouth at bedtime as needed for sleep.  Dispense: 30 tablet; Refill: 1 - sertraline (ZOLOFT) 25 MG tablet; Take 3 tablets (75 mg total) by mouth daily.  Dispense: 90 tablet; Refill: 0 - Basic Metabolic Panel - QUEtiapine (SEROQUEL) 100 MG tablet; Take 1 tablet (100 mg total) by mouth at bedtime.  Dispense: 30 tablet; Refill: 1  3. GAD (generalized anxiety disorder) Continue BuSpar - busPIRone (BUSPAR) 10 MG tablet; Take 1 tablet (10 mg total) by mouth 3 (three) times daily.  Dispense: 90 tablet; Refill: 1 - TSH  4. Chronic bilateral low back pain with left-sided sciatica Increase gabapentin 300 mg 3 times a day, refer to Ortho for further evaluation  Last imaging was July 06, 2021   IMPRESSION: 1. No acute osseous abnormalities of the lumbar spine. 2. Multilevel degenerative disc disease and facet hypertrophy, as described. 3. Multilevel neural foraminal stenosis.   Aortic Atherosclerosis (ICD10-I70.0).     Electronically Signed   By: Keith Rake M.D.   On: 07/06/2021 18:22   - gabapentin (NEURONTIN) 300 MG capsule; Take 1 capsule (300 mg total) by mouth 3 (three) times daily.  Dispense: 90 capsule; Refill: 1 - Cholecalciferol (VITAMIN D3) 50 MCG  (2000 UT) capsule; Take 2 capsules (4,000 Units total) by mouth daily.  Dispense: 60 capsule; Refill: 1 - methocarbamol (ROBAXIN) 500 MG tablet; Take 1 tablet (500 mg total) by mouth every 8 (eight) hours as needed for muscle spasms.  Dispense: 90 tablet; Refill: 1 - Lidocaine (LIDOCAINE PAIN RELIEF) 4 % PTCH; Apply 1 each topically 2 (two) times daily as needed.  Dispense: 60 patch; Refill: 1 - Ambulatory referral to Orthopedic Surgery - meloxicam (MOBIC) 15 MG tablet; Take 1 tablet (15 mg total) by mouth daily.  Dispense: 30 tablet; Refill: 1  5. Alcohol use disorder, severe, dependence ([place]) Patient was started on this medication during his detox, patient encouraged to follow-up with [name] for proper guidance on discontinuation.  Patient understands and agrees - acamprosate (CAMPRAL) 333 MG tablet; Take 1 tablet (333 mg total) by mouth 3 (three) times daily with meals.  Dispense: 90 tablet; Refill: 1  6. History of gout Use as needed.  Patient encouraged to increase hydration - indomethacin (INDOCIN) 25 MG capsule; Take 1 capsule (25 mg total) by mouth 3 (three) times daily as needed for moderate pain or mild pain (gout pain).  Dispense: 90 capsule; Refill: 1 - Uric Acid  7. Vitamin D deficiency  - Vitamin D, 25-hydroxy  8. Screening PSA (prostate specific antigen)  - PSA  9. Encounter for HCV screening test for low risk patient  - HCV Ab w Reflex to Quant PCR   I have reviewed the patient's medical history (PMH, PSH, Social History, Family History, Medications, and allergies) , and have been updated if relevant. I spent 40 minutes reviewing chart and  face to face time with patient.   Follow-up: Return in about 2 weeks (around 10/20/2021).   Loraine Grip Mayers, PA-C

## 2021-10-06 NOTE — Progress Notes (Signed)
Patient has eaten and taken medication today Patient reports chronic back pain scaled at a 9. Patient has used lidocaine patches with minimal relief for the past 15 days.

## 2021-10-07 DIAGNOSIS — I700 Atherosclerosis of aorta: Secondary | ICD-10-CM | POA: Insufficient documentation

## 2021-10-07 DIAGNOSIS — Z8739 Personal history of other diseases of the musculoskeletal system and connective tissue: Secondary | ICD-10-CM | POA: Insufficient documentation

## 2021-10-07 DIAGNOSIS — E559 Vitamin D deficiency, unspecified: Secondary | ICD-10-CM | POA: Insufficient documentation

## 2021-10-07 DIAGNOSIS — I10 Essential (primary) hypertension: Secondary | ICD-10-CM | POA: Insufficient documentation

## 2021-10-07 LAB — BASIC METABOLIC PANEL
BUN/Creatinine Ratio: 16 (ref 10–24)
BUN: 15 mg/dL (ref 8–27)
CO2: 18 mmol/L — ABNORMAL LOW (ref 20–29)
Calcium: 10 mg/dL (ref 8.6–10.2)
Chloride: 101 mmol/L (ref 96–106)
Creatinine, Ser: 0.94 mg/dL (ref 0.76–1.27)
Glucose: 85 mg/dL (ref 70–99)
Potassium: 4.4 mmol/L (ref 3.5–5.2)
Sodium: 139 mmol/L (ref 134–144)
eGFR: 92 mL/min/{1.73_m2} (ref >59)

## 2021-10-07 LAB — HCV AB W REFLEX TO QUANT PCR: HCV Ab: <0.1 s/co ratio (ref 0.0–0.9)

## 2021-10-07 LAB — VITAMIN D 25 HYDROXY (VIT D DEFICIENCY, FRACTURES): Vit D, 25-Hydroxy: 24.5 ng/mL — ABNORMAL LOW (ref 30.0–100.0)

## 2021-10-07 LAB — HCV INTERPRETATION

## 2021-10-07 LAB — URIC ACID: Uric Acid: 7.2 mg/dL (ref 3.8–8.4)

## 2021-10-07 LAB — PSA: Prostate Specific Ag, Serum: 1.5 ng/mL (ref 0.0–4.0)

## 2021-10-07 LAB — TSH: TSH: 0.539 u[IU]/mL (ref 0.450–4.500)

## 2021-10-07 MED ORDER — ALLOPURINOL 100 MG PO TABS: 100.0000 mg | ORAL_TABLET | Freq: Every day | ORAL | 1 refills | Status: AC

## 2021-10-07 NOTE — Addendum Note (Signed)
Addended by: Roney Jaffe on: 10/07/2021 10:10 AM   Modules accepted: Orders

## 2021-10-13 ENCOUNTER — Telehealth: Payer: Self-pay | Admitting: *Deleted

## 2021-10-13 NOTE — Telephone Encounter (Signed)
-----   Message from Roney Jaffe, [place] sent at 10/07/2021 10:10 AM EST ----- Please call patient and let him know that his thyroid function and kidney function are within normal limits.  His screening for hepatitis C and prostate cancer were negative.  His vitamin D levels are below normal limits, he should continue taking the 4000 units once daily that he is currently taking.  His uric acid levels are slightly above normal limits for someone with a history of gout.  He should start taking allopurinol on a daily basis and have these levels rechecked in approximately 2-4 weeks

## 2021-10-13 NOTE — Telephone Encounter (Signed)
MA spoke with nurse Jamesetta So with [facility]. RN is aware of patient results and recheck in 2-4 weeks.

## 2022-08-15 IMAGING — CR DG PELVIS 1-2V
1 series · 1 of 1 positions shown · non-contrast
Comparison: None.

CLINICAL DATA: Leg pain.

EXAM:
LEFT TIBIA AND FIBULA - 2 VIEW; PELVIS - 1-2 VIEW; LEFT FEMUR 2
VIEWS

[t pelvis ap]
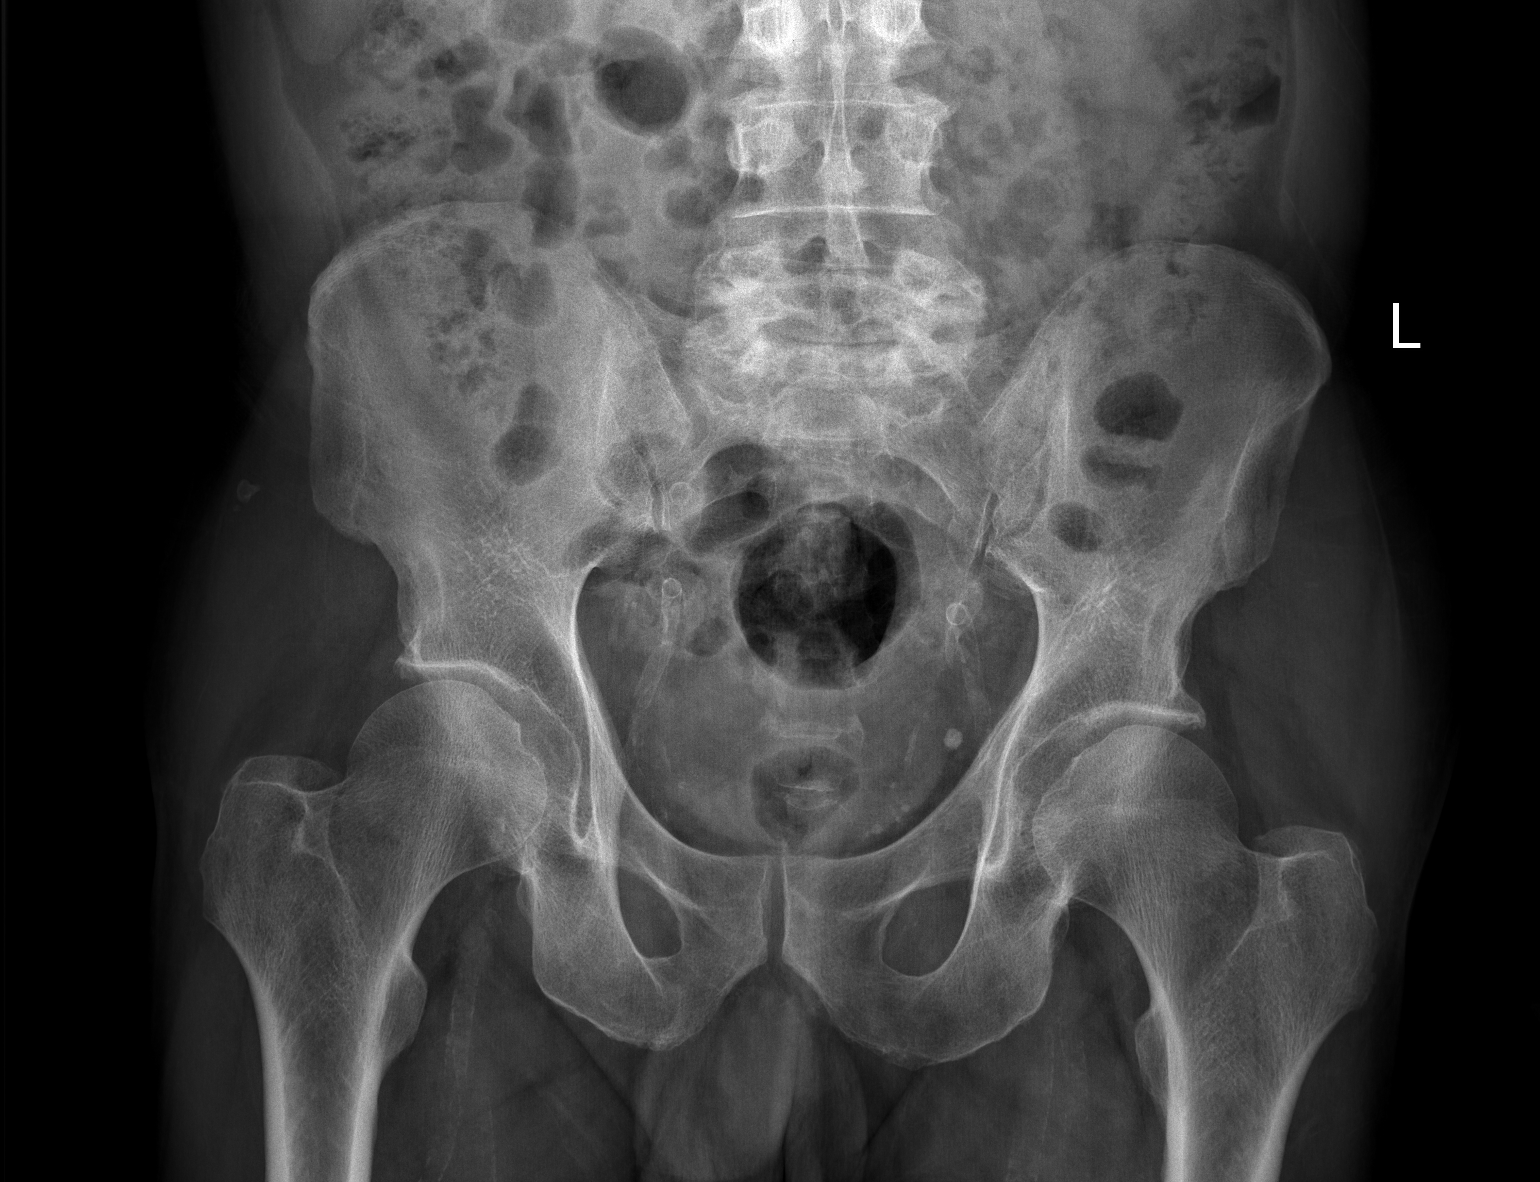

[1 of 1 positions shown; findings below may reference images not displayed]

FINDINGS: Pelvis:

There is no evidence of fracture or other focal bone lesions. Joint
spaces are maintained. Soft tissues are unremarkable.

Left femur:

There is no evidence of fracture or other focal bone lesions. Joint
spaces are maintained. There are vascular calcifications in soft
tissues.

Left tibia and fibula:

There is no evidence of fracture or other focal bone lesions. Joint
spaces are maintained. There are vascular calcifications in soft
tissues.
IMPRESSION: 1. No acute bony abnormality of the pelvis, left femur or left
tibia/fibula.
2. Peripheral vascular calcifications.

## 2022-08-15 IMAGING — CR DG TIBIA/FIBULA 2V*L*
4 series · 4 of 4 positions shown · non-contrast
Comparison: None.

CLINICAL DATA: Leg pain.

EXAM:
LEFT TIBIA AND FIBULA - 2 VIEW; PELVIS - 1-2 VIEW; LEFT FEMUR 2
VIEWS

[t tib-fib lat left (1 of 2)]
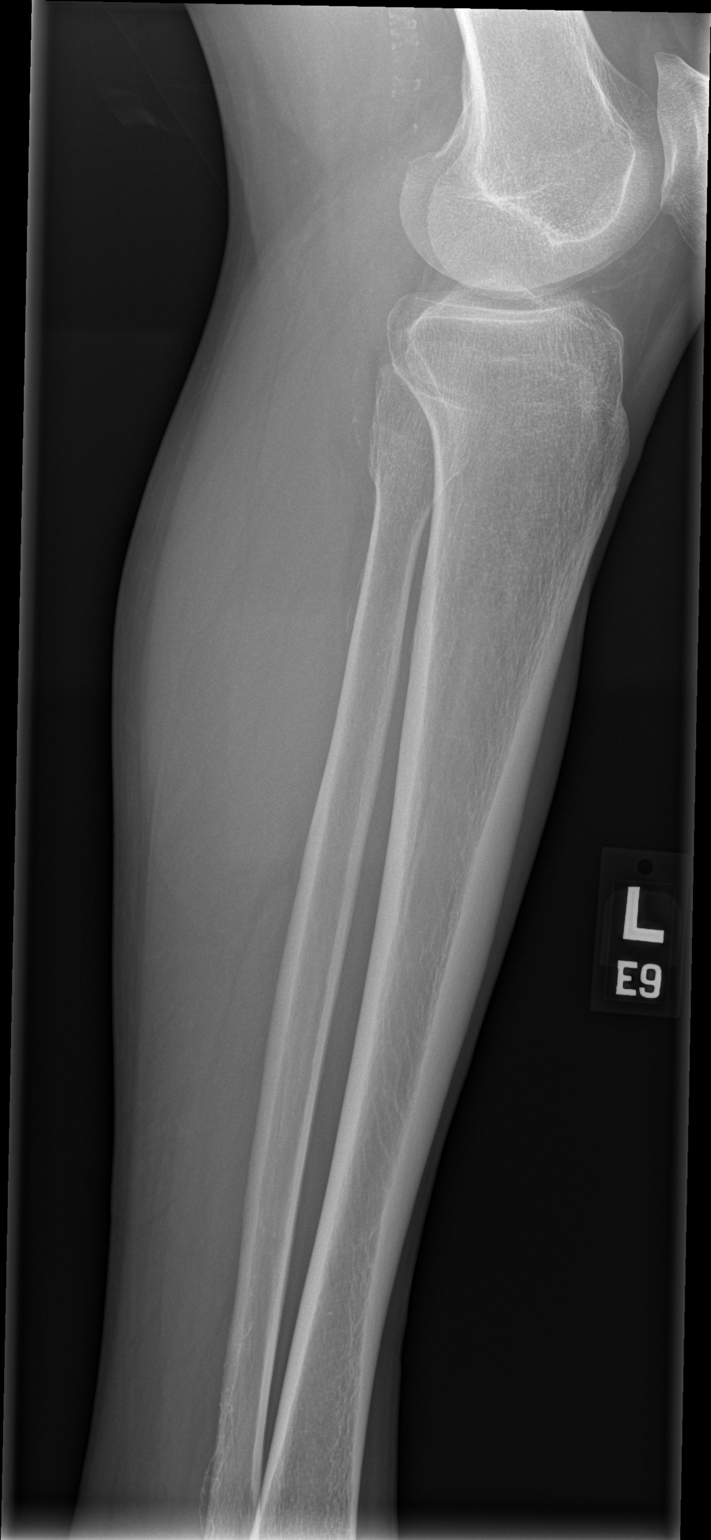

[t tib-fib lat left (2 of 2)]
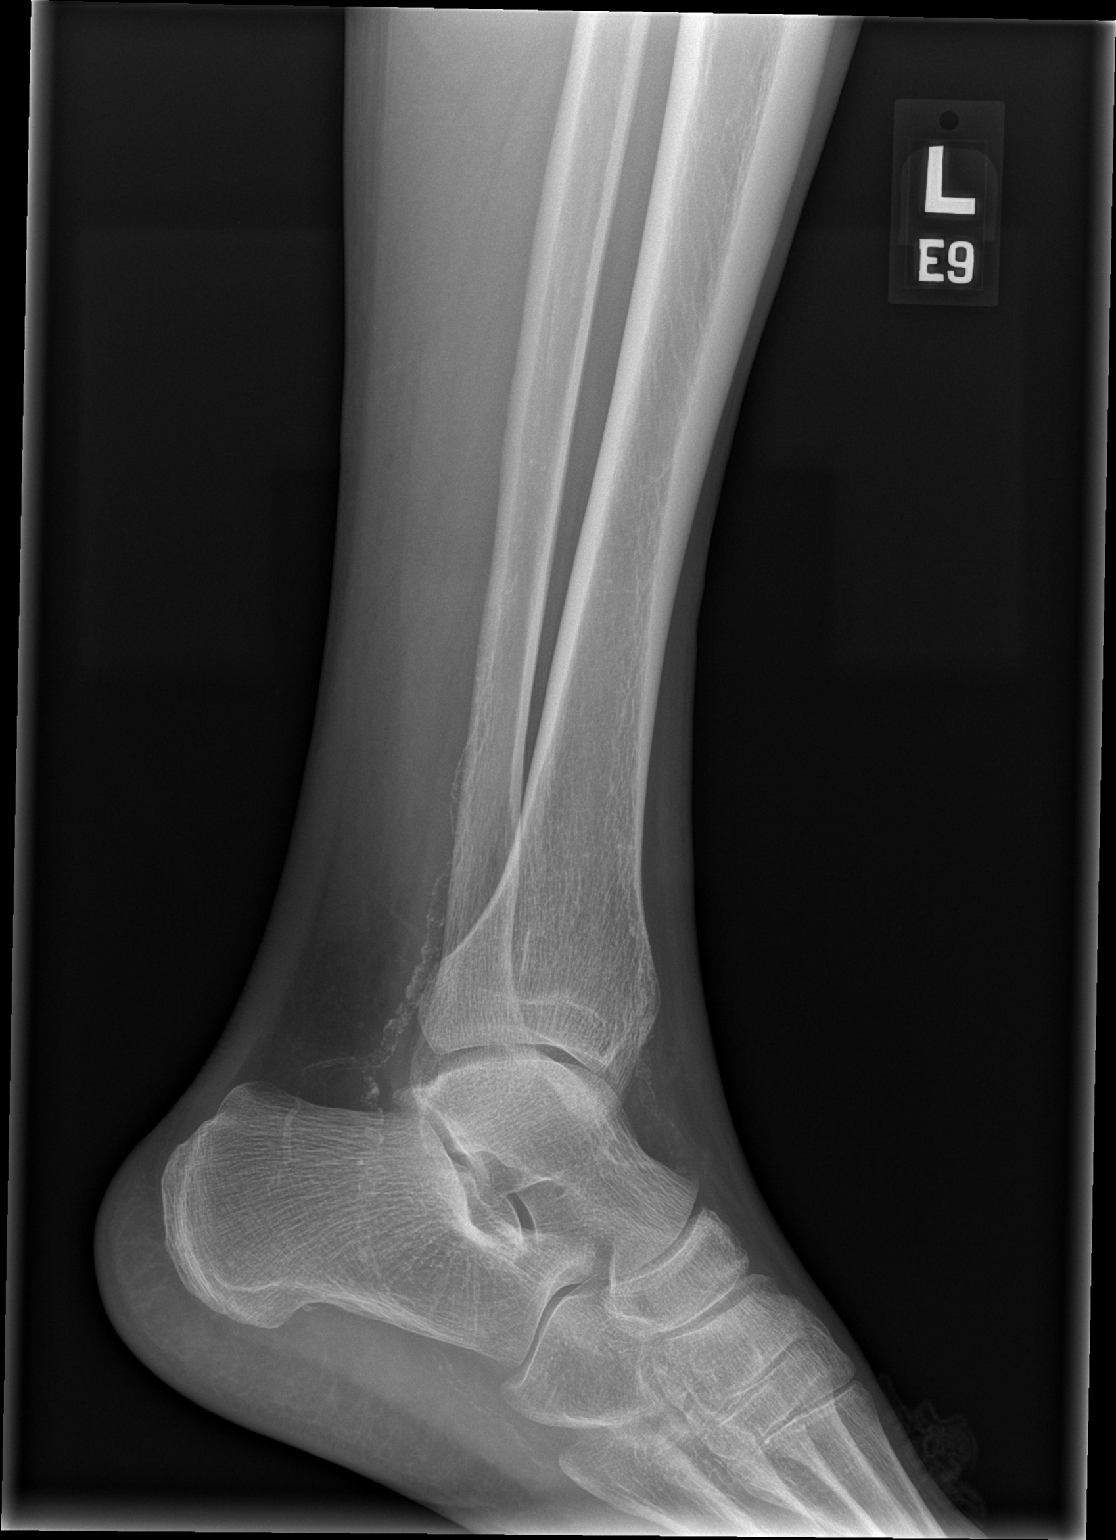

[t tib-fib ap left (1 of 2)]
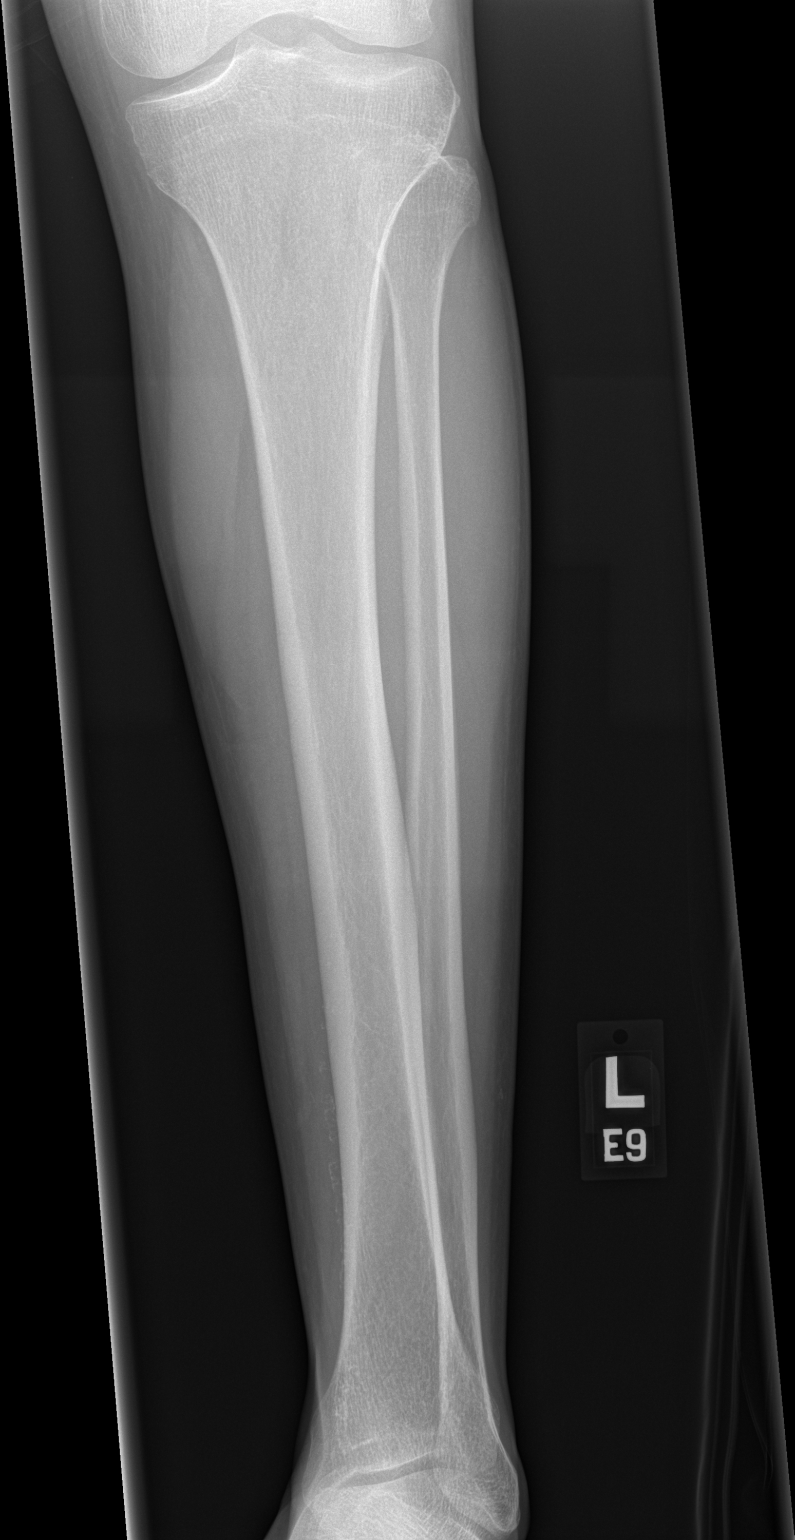

[t tib-fib ap left (2 of 2)]
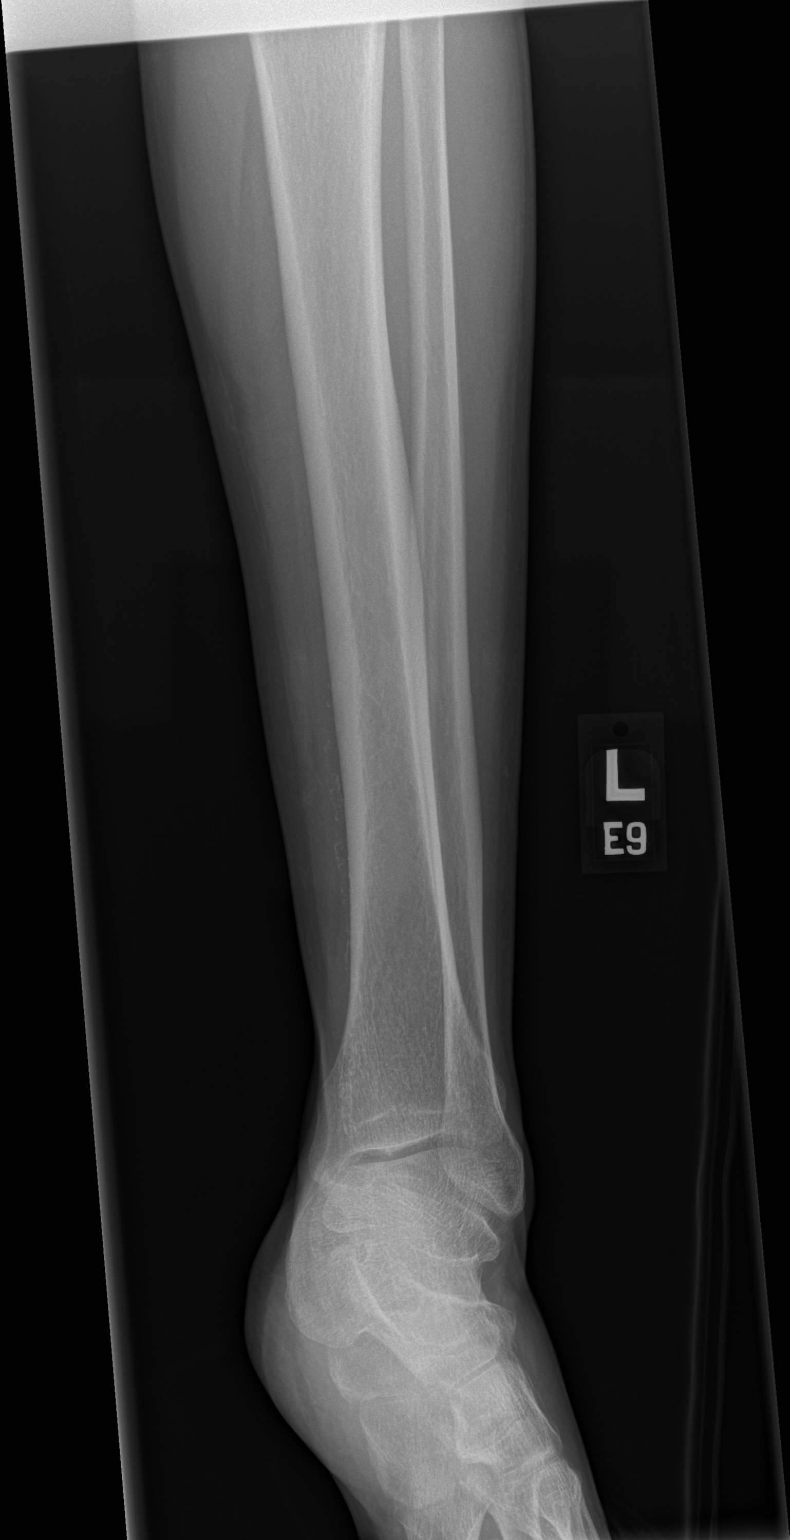

[4 of 4 positions shown; findings below may reference images not displayed]

FINDINGS: Pelvis:

There is no evidence of fracture or other focal bone lesions. Joint
spaces are maintained. Soft tissues are unremarkable.

Left femur:

There is no evidence of fracture or other focal bone lesions. Joint
spaces are maintained. There are vascular calcifications in soft
tissues.

Left tibia and fibula:

There is no evidence of fracture or other focal bone lesions. Joint
spaces are maintained. There are vascular calcifications in soft
tissues.
IMPRESSION: 1. No acute bony abnormality of the pelvis, left femur or left
tibia/fibula.
2. Peripheral vascular calcifications.

## 2022-08-15 IMAGING — CR DG FEMUR 2+V*L*
4 series · 4 of 4 positions shown · non-contrast
Comparison: None.

CLINICAL DATA: Leg pain.

EXAM:
LEFT TIBIA AND FIBULA - 2 VIEW; PELVIS - 1-2 VIEW; LEFT FEMUR 2
VIEWS

[t femur proximal ap left]
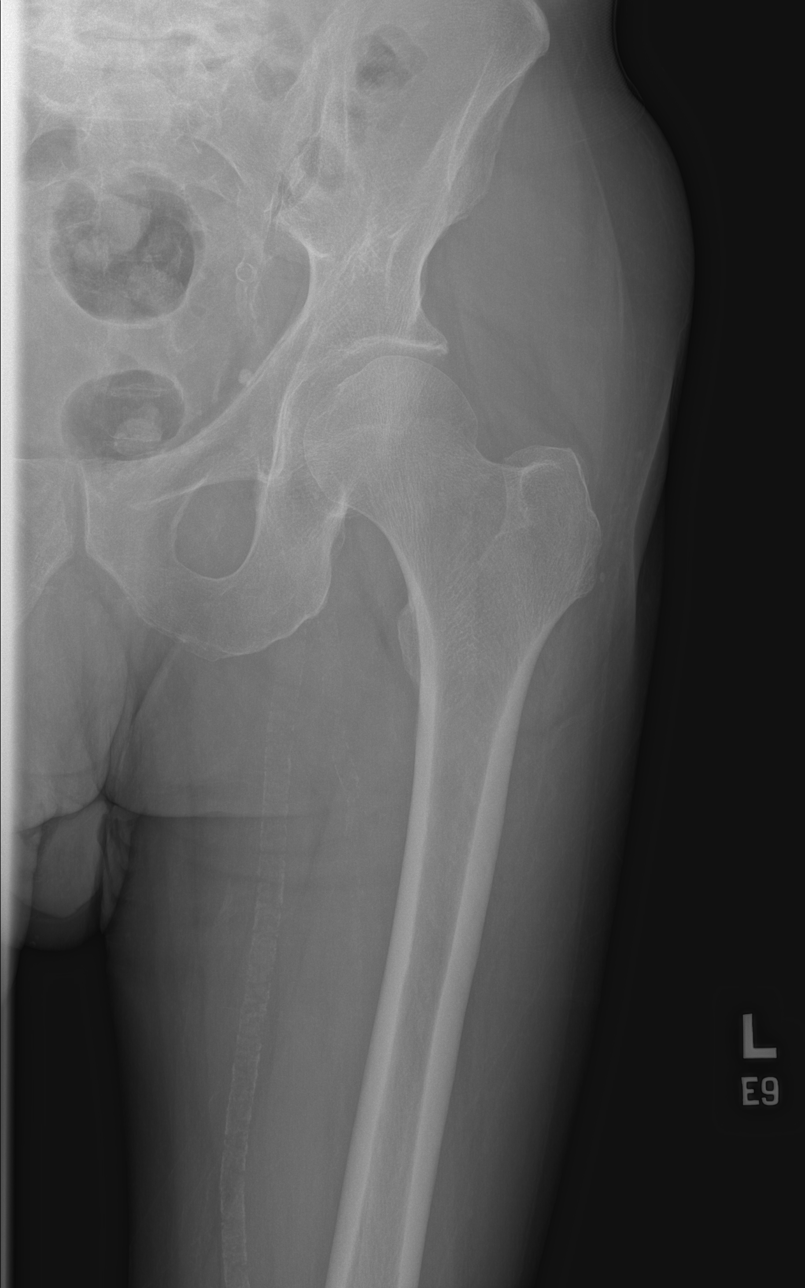

[t femur distal ap left]
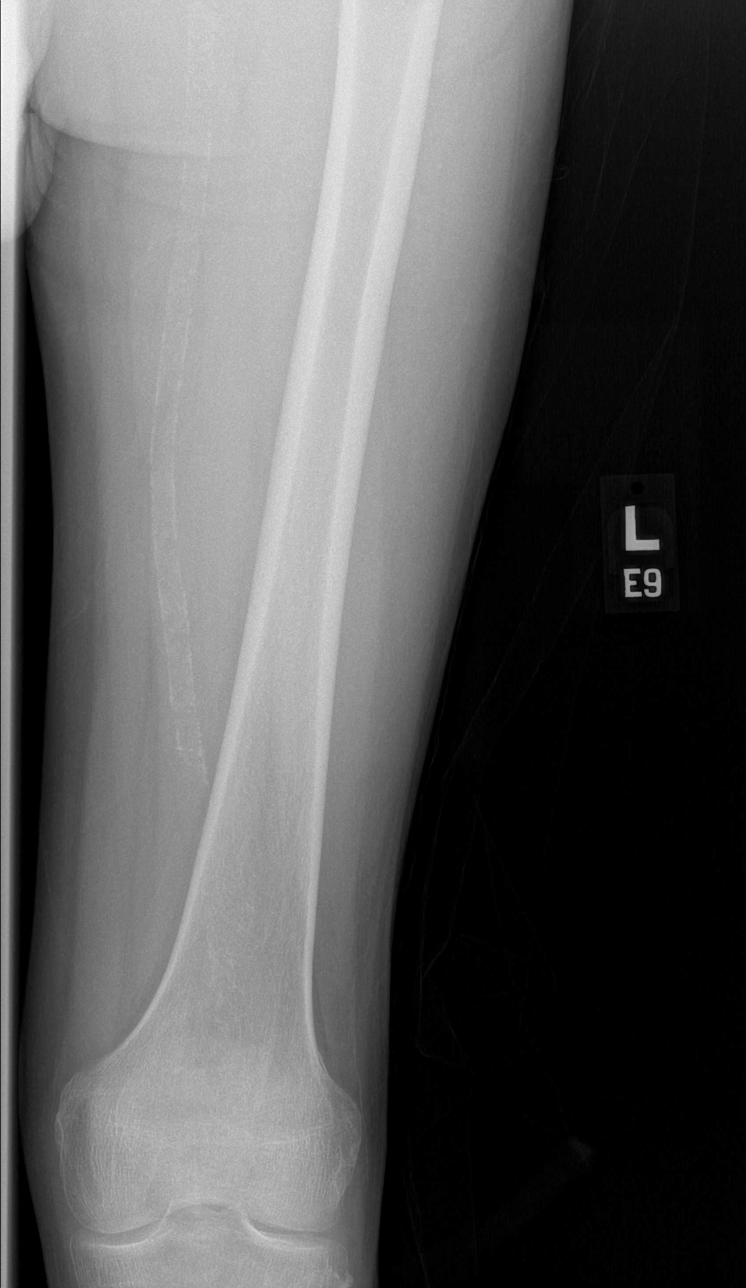

[t femur proximal lat left]
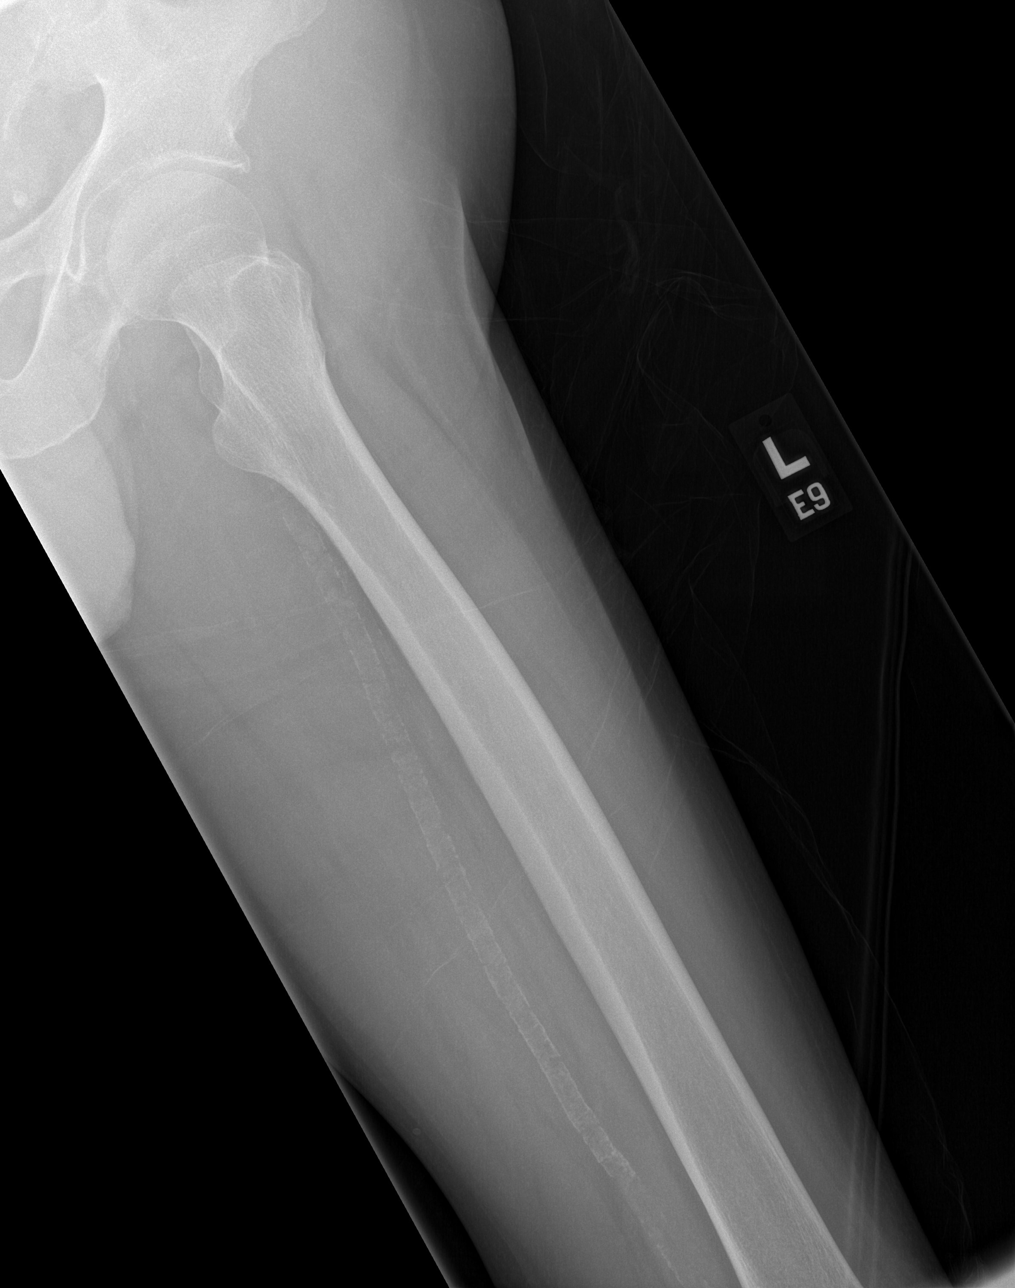

[t femur distal lat left]
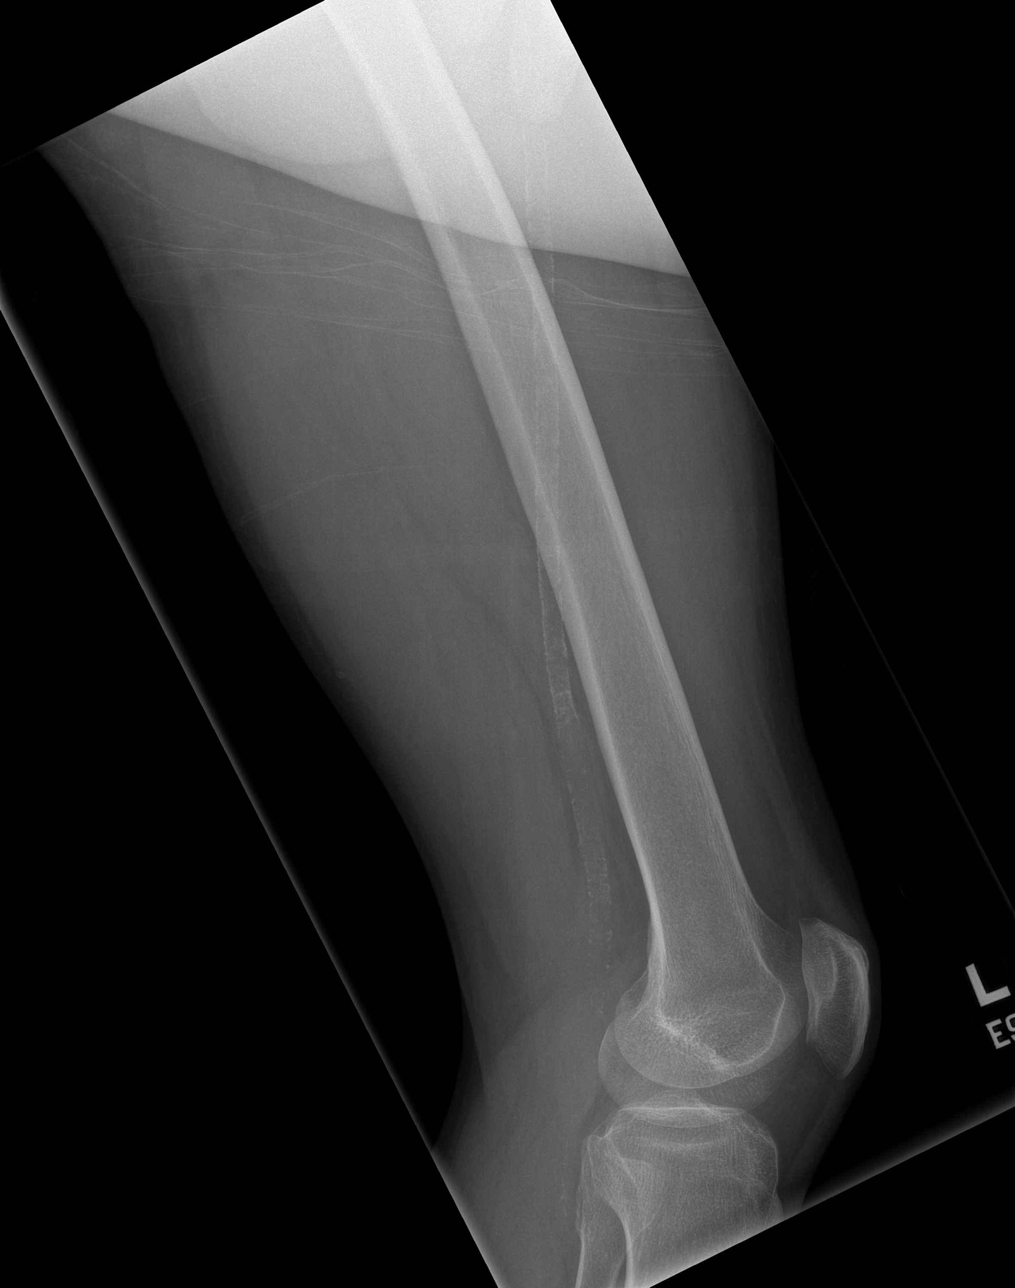

[4 of 4 positions shown; findings below may reference images not displayed]

FINDINGS: Pelvis:

There is no evidence of fracture or other focal bone lesions. Joint
spaces are maintained. Soft tissues are unremarkable.

Left femur:

There is no evidence of fracture or other focal bone lesions. Joint
spaces are maintained. There are vascular calcifications in soft
tissues.

Left tibia and fibula:

There is no evidence of fracture or other focal bone lesions. Joint
spaces are maintained. There are vascular calcifications in soft
tissues.
IMPRESSION: 1. No acute bony abnormality of the pelvis, left femur or left
tibia/fibula.
2. Peripheral vascular calcifications.

## 2023-05-21 NOTE — Progress Notes (Signed)
This encounter was created in error - please disregard.
